# Patient Record
Sex: Female | Born: 1980 | Race: White | Hispanic: No | Marital: Married | Smoking: Current every day smoker
Health system: Southern US, Community
[De-identification: ages and names within clinical notes are randomized; demographics above are authoritative.]

## PROBLEM LIST (undated history)

## (undated) ENCOUNTER — Inpatient Hospital Stay: Payer: Self-pay

## (undated) DIAGNOSIS — O021 Missed abortion: Secondary | ICD-10-CM

## (undated) DIAGNOSIS — O24419 Gestational diabetes mellitus in pregnancy, unspecified control: Secondary | ICD-10-CM

## (undated) DIAGNOSIS — B977 Papillomavirus as the cause of diseases classified elsewhere: Secondary | ICD-10-CM

## (undated) DIAGNOSIS — I10 Essential (primary) hypertension: Secondary | ICD-10-CM

## (undated) DIAGNOSIS — R87810 Cervical high risk human papillomavirus (HPV) DNA test positive: Secondary | ICD-10-CM

## (undated) DIAGNOSIS — O2620 Pregnancy care for patient with recurrent pregnancy loss, unspecified trimester: Secondary | ICD-10-CM

## (undated) DIAGNOSIS — N946 Dysmenorrhea, unspecified: Secondary | ICD-10-CM

## (undated) DIAGNOSIS — R87610 Atypical squamous cells of undetermined significance on cytologic smear of cervix (ASC-US): Secondary | ICD-10-CM

## (undated) DIAGNOSIS — F419 Anxiety disorder, unspecified: Secondary | ICD-10-CM

## (undated) DIAGNOSIS — E669 Obesity, unspecified: Secondary | ICD-10-CM

## (undated) HISTORY — DX: Papillomavirus as the cause of diseases classified elsewhere: B97.7

## (undated) HISTORY — DX: Anxiety disorder, unspecified: F41.9

## (undated) HISTORY — DX: Atypical squamous cells of undetermined significance on cytologic smear of cervix (ASC-US): R87.610

## (undated) HISTORY — PX: TONSILLECTOMY: SUR1361

## (undated) HISTORY — PX: BREAST SURGERY: SHX581

## (undated) HISTORY — DX: Gestational diabetes mellitus in pregnancy, unspecified control: O24.419

## (undated) HISTORY — DX: Dysmenorrhea, unspecified: N94.6

## (undated) HISTORY — PX: BREAST ENHANCEMENT SURGERY: SHX7

## (undated) HISTORY — DX: Cervical high risk human papillomavirus (HPV) DNA test positive: R87.810

---

## 2011-12-22 DIAGNOSIS — R87610 Atypical squamous cells of undetermined significance on cytologic smear of cervix (ASC-US): Secondary | ICD-10-CM

## 2011-12-22 HISTORY — DX: Atypical squamous cells of undetermined significance on cytologic smear of cervix (ASC-US): R87.610

## 2011-12-23 LAB — RESULTS CONSOLE HPV: CHL HPV: POSITIVE

## 2012-01-01 HISTORY — PX: COLPOSCOPY: SHX161

## 2016-01-02 LAB — HM HIV SCREENING LAB: HM HIV Screening: NEGATIVE

## 2016-01-02 LAB — HEPATIC FUNCTION PANEL
ALT: 10 (ref 7–35)
AST: 11 — AB (ref 13–35)
Alkaline Phosphatase: 53 (ref 25–125)
Bilirubin, Total: 0.3

## 2016-01-02 LAB — COMPREHENSIVE METABOLIC PANEL
Albumin: 4.2 (ref 3.5–5.0)
Calcium: 9 (ref 8.7–10.7)
Globulin: 2.3

## 2016-01-02 LAB — CBC: RBC: 4.04 (ref 3.87–5.11)

## 2016-01-02 LAB — BASIC METABOLIC PANEL
BUN: 8 (ref 4–21)
CO2: 18 (ref 13–22)
Chloride: 98 — AB (ref 99–108)
Creatinine: 0.6 (ref 0.5–1.1)
Glucose: 130
Potassium: 3.9 (ref 3.4–5.3)
Sodium: 136 — AB (ref 137–147)

## 2016-01-02 LAB — CBC AND DIFFERENTIAL
HCT: 38 (ref 36–46)
Hemoglobin: 12.6 (ref 12.0–16.0)
WBC: 7.6

## 2016-01-02 LAB — OB RESULTS CONSOLE GC/CHLAMYDIA: Chlamydia: NEGATIVE

## 2016-02-14 ENCOUNTER — Encounter: Payer: Self-pay | Admitting: *Deleted

## 2016-02-14 ENCOUNTER — Inpatient Hospital Stay: Admission: RE | Admit: 2016-02-14 | Payer: Self-pay | Source: Ambulatory Visit

## 2016-02-14 NOTE — Patient Instructions (Signed)
  Your procedure is scheduled on: 02-15-16  Report to [facility] 2ND FLOOR SURGERY DESK @ 1:30-PT NOTIFIED OF TIME   Remember: Instructions that are not followed completely may result in serious medical risk, up to and including death, or upon the discretion of your surgeon and anesthesiologist your surgery may need to be rescheduled.    _X___ 1. Do not eat food or drink liquids after midnight. No gum chewing or hard candies.     _X___ 2. No Alcohol for 24 hours before or after surgery.   ____ 3. Bring all medications with you on the day of surgery if instructed.    ____ 4. Notify your doctor if there is any change in your medical condition     (cold, fever, infections).     Do not wear jewelry, make-up, hairpins, clips or nail polish.  Do not wear lotions, powders, or perfumes. You may wear deodorant.  Do not shave 48 hours prior to surgery. Men may shave face and neck.  Do not bring valuables to the hospital.    [facility] is not responsible for any belongings or valuables.               Contacts, dentures or bridgework may not be worn into surgery.  Leave your suitcase in the car. After surgery it may be brought to your room.  For patients admitted to the hospital, discharge time is determined by your treatment team.   Patients discharged the day of surgery will not be allowed to drive home.   Please read over the following fact sheets that you were given:     ____ Take these medicines the morning of surgery with A SIP OF WATER:    1. NONE  2.   3.   4.  5.  6.  ____ Fleet Enema (as directed)   ____ Use CHG Soap as directed  ____ Use inhalers on the day of surgery  ____ Stop metformin 2 days prior to surgery    ____ Take 1/2 of usual insulin dose the night before surgery and none on the morning of surgery.   ____ Stop Coumadin/Plavix/aspirin-N/A  X____ Stop Anti-inflammatories-NO NSAIDS OR ASA PRODUCTS-TYLENOL OK TO TAKE   ____ Stop supplements until after  surgery.    ____ Bring C-Pap to the hospital.

## 2016-02-14 NOTE — Pre-Procedure Instructions (Signed)
CALLED NANCY AT [facility] TO SEE MAKE SURE THE # I HAVE BEEN CALLING IS CORRECT SINCE PT IS NOT CALLING BACK FOR PHONE INTERVIEW-NANCY SAID THIS WAS THE CORRECT #

## 2016-02-15 ENCOUNTER — Ambulatory Visit
Admission: RE | Admit: 2016-02-15 | Discharge: 2016-02-15 | Disposition: A | Discharge status: Home / Self Care | Payer: 59 | Source: Ambulatory Visit | Attending: Obstetrics and Gynecology | Admitting: Obstetrics and Gynecology

## 2016-02-15 ENCOUNTER — Ambulatory Visit: Payer: 59 | Admitting: Certified Registered Nurse Anesthetist

## 2016-02-15 ENCOUNTER — Encounter
Admission: RE | Disposition: A | Discharge status: Home / Self Care | Payer: Self-pay | Source: Ambulatory Visit | Attending: Obstetrics and Gynecology

## 2016-02-15 ENCOUNTER — Encounter: Payer: Self-pay | Admitting: *Deleted

## 2016-02-15 DIAGNOSIS — I10 Essential (primary) hypertension: Secondary | ICD-10-CM

## 2016-02-15 DIAGNOSIS — O021 Missed abortion: Secondary | ICD-10-CM | POA: Insufficient documentation

## 2016-02-15 DIAGNOSIS — Z9889 Other specified postprocedural states: Secondary | ICD-10-CM

## 2016-02-15 HISTORY — DX: Essential (primary) hypertension: I10

## 2016-02-15 HISTORY — DX: Missed abortion: O02.1

## 2016-02-15 HISTORY — PX: DILATION AND EVACUATION: SHX1459

## 2016-02-15 LAB — CBC
HEMATOCRIT: 38.9 % (ref 35.0–47.0)
HEMOGLOBIN: 13.3 g/dL (ref 12.0–16.0)
MCH: 31.1 pg (ref 26.0–34.0)
MCHC: 34.1 g/dL (ref 32.0–36.0)
MCV: 91.3 fL (ref 80.0–100.0)
Platelets: 255 10*3/uL (ref 150–440)
RBC: 4.26 MIL/uL (ref 3.80–5.20)
RDW: 12.4 % (ref 11.5–14.5)
WBC: 8.6 10*3/uL (ref 3.6–11.0)

## 2016-02-15 LAB — TYPE AND SCREEN
ABO/RH(D): O NEG
ANTIBODY SCREEN: NEGATIVE

## 2016-02-15 SURGERY — DILATION AND EVACUATION, UTERUS
Anesthesia: General | Wound class: Clean Contaminated

## 2016-02-15 MED ORDER — FAMOTIDINE 20 MG PO TABS
ORAL_TABLET | ORAL | Status: AC
  Filled 2016-02-15: qty 1

## 2016-02-15 MED ORDER — METHYLERGONOVINE MALEATE 0.2 MG/ML IJ SOLN
INTRAMUSCULAR | Status: DC | PRN
  Administered 2016-02-15: 0.2 mg via INTRAMUSCULAR

## 2016-02-15 MED ORDER — DOXYCYCLINE HYCLATE 100 MG IV SOLR
100.0000 mg | Freq: Once | INTRAVENOUS | Status: AC
  Administered 2016-02-15: 100 mg via INTRAVENOUS
  Filled 2016-02-15: qty 100

## 2016-02-15 MED ORDER — ONDANSETRON HCL 4 MG/2ML IJ SOLN: 4.0000 mg | Freq: Once | INTRAMUSCULAR | Status: DC | PRN

## 2016-02-15 MED ORDER — DEXAMETHASONE SODIUM PHOSPHATE 10 MG/ML IJ SOLN
INTRAMUSCULAR | Status: DC | PRN
  Administered 2016-02-15: 5 mg via INTRAVENOUS

## 2016-02-15 MED ORDER — METHYLERGONOVINE MALEATE 0.2 MG/ML IJ SOLN
INTRAMUSCULAR | Status: AC
Start: 2016-02-15 — End: 2016-02-15
  Filled 2016-02-15: qty 1

## 2016-02-15 MED ORDER — LACTATED RINGERS IV SOLN
INTRAVENOUS | Status: DC
  Administered 2016-02-15: 15:00:00 via INTRAVENOUS

## 2016-02-15 MED ORDER — FAMOTIDINE 20 MG PO TABS
20.0000 mg | ORAL_TABLET | Freq: Once | ORAL | Status: AC
  Administered 2016-02-15: 20 mg via ORAL

## 2016-02-15 MED ORDER — KETOROLAC TROMETHAMINE 30 MG/ML IJ SOLN
INTRAMUSCULAR | Status: DC | PRN
  Administered 2016-02-15: 30 mg via INTRAVENOUS

## 2016-02-15 MED ORDER — FENTANYL CITRATE (PF) 100 MCG/2ML IJ SOLN
25.0000 ug | INTRAMUSCULAR | Status: DC | PRN
  Administered 2016-02-15 (×2): 50 ug via INTRAVENOUS

## 2016-02-15 MED ORDER — METHYLERGONOVINE MALEATE 0.2 MG PO TABS: 0.2000 mg | ORAL_TABLET | Freq: Three times a day (TID) | ORAL | Status: DC

## 2016-02-15 MED ORDER — FENTANYL CITRATE (PF) 100 MCG/2ML IJ SOLN
INTRAMUSCULAR | Status: DC | PRN
  Administered 2016-02-15 (×4): 25 ug via INTRAVENOUS

## 2016-02-15 MED ORDER — HYDROCODONE-ACETAMINOPHEN 5-325 MG PO TABS: 1.0000 | ORAL_TABLET | Freq: Four times a day (QID) | ORAL | Status: DC | PRN

## 2016-02-15 MED ORDER — FENTANYL CITRATE (PF) 100 MCG/2ML IJ SOLN
INTRAMUSCULAR | Status: AC
  Filled 2016-02-15: qty 2

## 2016-02-15 MED ORDER — PROPOFOL 10 MG/ML IV BOLUS
INTRAVENOUS | Status: DC | PRN
  Administered 2016-02-15: 200 mg via INTRAVENOUS

## 2016-02-15 MED ORDER — RHO D IMMUNE GLOBULIN 1500 UNIT/2ML IJ SOSY
300.0000 ug | PREFILLED_SYRINGE | Freq: Once | INTRAMUSCULAR | Status: DC
  Filled 2016-02-15: qty 2

## 2016-02-15 MED ORDER — DOXYCYCLINE HYCLATE 100 MG PO TABS
200.0000 mg | ORAL_TABLET | Freq: Once | ORAL | Status: AC
  Administered 2016-02-15: 200 mg via ORAL
  Filled 2016-02-15: qty 2

## 2016-02-15 MED ORDER — IBUPROFEN 600 MG PO TABS: 600.0000 mg | ORAL_TABLET | Freq: Four times a day (QID) | ORAL | Status: DC | PRN

## 2016-02-15 MED ORDER — MIDAZOLAM HCL 5 MG/5ML IJ SOLN
INTRAMUSCULAR | Status: DC | PRN
  Administered 2016-02-15: 2 mg via INTRAVENOUS

## 2016-02-15 MED ORDER — ONDANSETRON HCL 4 MG/2ML IJ SOLN
INTRAMUSCULAR | Status: DC | PRN
  Administered 2016-02-15: 4 mg via INTRAVENOUS

## 2016-02-15 MED ORDER — LIDOCAINE HCL (CARDIAC) 20 MG/ML IV SOLN
INTRAVENOUS | Status: DC | PRN
  Administered 2016-02-15: 60 mg via INTRAVENOUS

## 2016-02-15 SURGICAL SUPPLY — 18 items
CATH ROBINSON RED A/P 16FR (CATHETERS) ×3 IMPLANT
FILTER UTR ASPR SPEC (MISCELLANEOUS) ×1 IMPLANT
FLTR UTR ASPR SPEC (MISCELLANEOUS) ×3
GLOVE BIO SURGEON STRL SZ7 (GLOVE) ×3 IMPLANT
GOWN STRL REUS W/ TWL LRG LVL3 (GOWN DISPOSABLE) ×2 IMPLANT
GOWN STRL REUS W/TWL LRG LVL3 (GOWN DISPOSABLE) ×4
KIT BERKELEY 1ST TRIMESTER 3/8 (MISCELLANEOUS) ×3 IMPLANT
KIT RM TURNOVER CYSTO AR (KITS) ×3 IMPLANT
NS IRRIG 500ML POUR BTL (IV SOLUTION) ×3 IMPLANT
PACK DNC HYST (MISCELLANEOUS) ×3 IMPLANT
PAD OB MATERNITY 4.3X12.25 (PERSONAL CARE ITEMS) ×3 IMPLANT
PAD PREP 24X41 OB/GYN DISP (PERSONAL CARE ITEMS) ×3 IMPLANT
SET BERKELEY SUCTION TUBING (SUCTIONS) ×3 IMPLANT
TOWEL OR 17X26 4PK STRL BLUE (TOWEL DISPOSABLE) ×3 IMPLANT
VACURETTE 10 RIGID CVD (CANNULA) IMPLANT
VACURETTE 12 RIGID CVD (CANNULA) IMPLANT
VACURETTE 8 RIGID CVD (CANNULA) IMPLANT
VACURETTE 8MM F TIP (MISCELLANEOUS) ×3 IMPLANT

## 2016-02-15 NOTE — Discharge Instructions (Signed)
AMBULATORY SURGERY  DISCHARGE INSTRUCTIONS   1) The drugs that you were given will stay in your system until tomorrow so for the next 24 hours you should not:  A) Drive an automobile B) Make any legal decisions C) Drink any alcoholic beverage   2) You may resume regular meals tomorrow.  Today it is better to start with liquids and gradually work up to solid foods.  You may eat anything you prefer, but it is better to start with liquids, then soup and crackers, and gradually work up to solid foods.   3) Please notify your doctor immediately if you have any unusual bleeding, trouble breathing, redness and pain at the surgery site, drainage, fever, or pain not relieved by medication.    4) Additional Instructions:        Please contact your physician with any problems or Same Day Surgery at [phone], Monday through Friday 6 am to 4 pm, or [place] at [facility] Main number at [phone].\[id number]\

## 2016-02-15 NOTE — Anesthesia Procedure Notes (Signed)
Procedure Name: LMA Insertion Date/Time: 02/15/2016 3:17 PM Performed by: Lily KocherPERALTA, Michiko Lineman Pre-anesthesia Checklist: Patient identified, Patient being monitored, Timeout performed, Emergency Drugs available and Suction available Patient Re-evaluated:Patient Re-evaluated prior to inductionOxygen Delivery Method: Circle system utilized Preoxygenation: Pre-oxygenation with 100% oxygen Intubation Type: IV induction Ventilation: Mask ventilation without difficulty LMA: LMA inserted LMA Size: 4.0 Tube type: Oral Number of attempts: 1 Placement Confirmation: positive ETCO2 and breath sounds checked- equal and bilateral Tube secured with: Tape Dental Injury: Teeth and Oropharynx as per pre-operative assessment

## 2016-02-15 NOTE — H&P (Signed)
Date of Initial paper H&P: 02/13/2016  History reviewed, patient examined, no change in status, stable for surgery.

## 2016-02-15 NOTE — Op Note (Signed)
Patient Name: Tiffany Chapman Date of Procedure: 02/15/2016  Preoperative Diagnosis: 1) 35 y.o. with 8 week missed abortion  Postoperative Diagnosis: 1) 35 y.o. with 8 week missed abortion  Operation Performed: Suction dilation and curettage  Indication: 8 week missed abortion desiring definitive surgical management  Anesthesia: General via LMA  Primary Surgeon: Vena AustriaAndreas Alfons Sulkowski, MD  Assistant: none  Preoperative Antibiotics: none  Estimated Blood Loss: 100mL  IV Fluids: 500mL  Urine Output:: ~2720mL straight cath  Drains or Tubes: none  Implants: none  Specimens Removed: products of conception  Complications: none  Intraoperative Findings:  8 week size anteverted uterus, cervix closed  Patient Condition: stable  Procedure in Detail:  Patient was taken to the operating room were she was administered general endotracheal anesthesia.  She was positioned in the dorsal lithotomy position utilizing Allen stirups, prepped and draped in the usual sterile fashion.  Uterus was noted to be 8 weeks in size, anteverted.   Prior to proceeding with the case a time out was performed.  Attention was turned to the patient's pelvis.  A red rubber catheter was used to empty the patient's bladder.  An operative speculum was placed to allow visualization of the cervix.  The anterior lip of the cervix was grasped with a single tooth tenaculum and the cervix was sequentially dilated using pratt dilators.  A size 8 flexible suction curette was advanced to the uterine fundus and several passes undertaken to clear the uterus.  Sharp curettage was then performed noting good uterine cry throughout.  A final pass with the suction curette was then taken.    The single tooth tenaculum was removed from the cervix.  The tenaculum sites and cervix were noted to be  Hemostatic before removing the operative speculum.  0.2mg  of methergine were administered IM.  Sponge needle and instrument counts were corrects  times two.  The patient tolerated the procedure well and was taken to the recovery room in stable condition.

## 2016-02-15 NOTE — Transfer of Care (Signed)
Immediate Anesthesia Transfer of Care Note  Patient: Tiffany Chapman  Procedure(s) Performed: Procedure(s): DILATATION AND EVACUATION (N/A)  Patient Location: PACU  Anesthesia Type:General  Level of Consciousness: awake and patient cooperative  Airway & Oxygen Therapy: Patient Spontanous Breathing and Patient connected to face mask oxygen  Post-op Assessment: Report given to RN  Post vital signs: Reviewed and stable  Last Vitals:  Filed Vitals:   02/15/16 1337 02/15/16 1556  BP: 142/105 134/90  Pulse: 75   Temp: 36.8 C 36.6 C  Resp: 16 16    Last Pain: There were no vitals filed for this visit.       Complications: No apparent anesthesia complications

## 2016-02-15 NOTE — Anesthesia Postprocedure Evaluation (Signed)
Anesthesia Post Note  Patient: Tiffany Chapman  Procedure(s) Performed: Procedure(s) (LRB): DILATATION AND EVACUATION (N/A)  Patient location during evaluation: PACU Anesthesia Type: General Level of consciousness: awake and alert and oriented Pain management: pain level controlled Vital Signs Assessment: post-procedure vital signs reviewed and stable Respiratory status: spontaneous breathing Cardiovascular status: blood pressure returned to baseline Anesthetic complications: no    Last Vitals:  Filed Vitals:   02/15/16 1639 02/15/16 1648  BP:  134/80  Pulse:  69  Temp: 36.4 C 36.1 C  Resp: 16 16    Last Pain:  Filed Vitals:   02/15/16 1652  PainSc: 3                  Tiffany Chapman

## 2016-02-15 NOTE — Anesthesia Preprocedure Evaluation (Addendum)
Anesthesia Evaluation  Patient identified by MRN, date of birth, ID band Patient awake    Reviewed: Allergy & Precautions, H&P , NPO status , Patient's Chart, lab work & pertinent test results, reviewed documented beta blocker date and time   History of Anesthesia Complications Negative for: history of anesthetic complications  Airway Mallampati: II  TM Distance: >3 FB Neck ROM: full    Dental no notable dental hx. (+) Teeth Intact, Missing   Pulmonary neg shortness of breath, neg sleep apnea, neg COPD, neg recent URI, Current Smoker,    Pulmonary exam normal breath sounds clear to auscultation       Cardiovascular Exercise Tolerance: Good hypertension, (-) angina(-) CAD, (-) Past MI, (-) Cardiac Stents and (-) CABG Normal cardiovascular exam(-) dysrhythmias (-) Valvular Problems/Murmurs Rhythm:regular Rate:Normal     Neuro/Psych negative neurological ROS  negative psych ROS   GI/Hepatic negative GI ROS, Neg liver ROS,   Endo/Other  negative endocrine ROS  Renal/GU negative Renal ROS  negative genitourinary   Musculoskeletal   Abdominal   Peds  Hematology negative hematology ROS (+)   Anesthesia Other Findings Past Medical History:   Hypertension                                                   Comment:PT WAS TAKEN OFF LABETOLOL BY PCP WHEN SHE               FOUND OUT SHE WAS PREGNANT DUE TO BP BEING               UNDER CONTROL   Missed abortion                                              Reproductive/Obstetrics negative OB ROS                            Anesthesia Physical Anesthesia Plan  ASA: II  Anesthesia Plan: General   Post-op Pain Management:    Induction:   Airway Management Planned: LMA  Additional Equipment:   Intra-op Plan:   Post-operative Plan:   Informed Consent: I have reviewed the patients History and Physical, chart, labs and discussed the procedure  including the risks, benefits and alternatives for the proposed anesthesia with the patient or authorized representative who has indicated his/her understanding and acceptance.   Dental Advisory Given  Plan Discussed with: Anesthesiologist, CRNA and Surgeon  Anesthesia Plan Comments:        Anesthesia Quick Evaluation

## 2016-02-18 ENCOUNTER — Encounter: Payer: Self-pay | Admitting: Obstetrics and Gynecology

## 2016-02-19 LAB — SURGICAL PATHOLOGY

## 2016-06-10 ENCOUNTER — Ambulatory Visit: Payer: 59 | Admitting: Anesthesiology

## 2016-06-10 ENCOUNTER — Encounter: Payer: Self-pay | Admitting: *Deleted

## 2016-06-10 ENCOUNTER — Encounter
Admission: RE | Disposition: A | Discharge status: Home / Self Care | Payer: Self-pay | Source: Ambulatory Visit | Attending: Obstetrics and Gynecology

## 2016-06-10 ENCOUNTER — Ambulatory Visit
Admission: RE | Admit: 2016-06-10 | Discharge: 2016-06-10 | Disposition: A | Discharge status: Home / Self Care | Payer: 59 | Source: Ambulatory Visit | Attending: Obstetrics and Gynecology | Admitting: Obstetrics and Gynecology

## 2016-06-10 DIAGNOSIS — I10 Essential (primary) hypertension: Secondary | ICD-10-CM | POA: Insufficient documentation

## 2016-06-10 DIAGNOSIS — O021 Missed abortion: Secondary | ICD-10-CM | POA: Diagnosis not present

## 2016-06-10 DIAGNOSIS — Z79899 Other long term (current) drug therapy: Secondary | ICD-10-CM | POA: Insufficient documentation

## 2016-06-10 DIAGNOSIS — Z9889 Other specified postprocedural states: Secondary | ICD-10-CM

## 2016-06-10 DIAGNOSIS — F419 Anxiety disorder, unspecified: Secondary | ICD-10-CM | POA: Insufficient documentation

## 2016-06-10 DIAGNOSIS — F17200 Nicotine dependence, unspecified, uncomplicated: Secondary | ICD-10-CM | POA: Insufficient documentation

## 2016-06-10 HISTORY — PX: DILATION AND EVACUATION: SHX1459

## 2016-06-10 LAB — CBC
HCT: 39.9 % (ref 35.0–47.0)
Hemoglobin: 13.6 g/dL (ref 12.0–16.0)
MCH: 30.9 pg (ref 26.0–34.0)
MCHC: 34.2 g/dL (ref 32.0–36.0)
MCV: 90.4 fL (ref 80.0–100.0)
PLATELETS: 279 10*3/uL (ref 150–440)
RBC: 4.42 MIL/uL (ref 3.80–5.20)
RDW: 12.6 % (ref 11.5–14.5)
WBC: 8.1 10*3/uL (ref 3.6–11.0)

## 2016-06-10 LAB — TYPE AND SCREEN
ABO/RH(D): O NEG
Antibody Screen: NEGATIVE

## 2016-06-10 SURGERY — DILATION AND EVACUATION, UTERUS
Anesthesia: General

## 2016-06-10 MED ORDER — ONDANSETRON HCL 4 MG/2ML IJ SOLN: 4.0000 mg | Freq: Once | INTRAMUSCULAR | Status: DC | PRN

## 2016-06-10 MED ORDER — FENTANYL CITRATE (PF) 100 MCG/2ML IJ SOLN
25.0000 ug | INTRAMUSCULAR | Status: DC | PRN
  Administered 2016-06-10: 25 ug via INTRAVENOUS
  Administered 2016-06-10: 50 ug via INTRAVENOUS
  Administered 2016-06-10: 25 ug via INTRAVENOUS

## 2016-06-10 MED ORDER — LIDOCAINE HCL (CARDIAC) 20 MG/ML IV SOLN
INTRAVENOUS | Status: DC | PRN
  Administered 2016-06-10: 60 mg via INTRAVENOUS

## 2016-06-10 MED ORDER — DEXAMETHASONE SODIUM PHOSPHATE 10 MG/ML IJ SOLN
INTRAMUSCULAR | Status: DC | PRN
  Administered 2016-06-10: 5 mg via INTRAVENOUS

## 2016-06-10 MED ORDER — DOXYCYCLINE HYCLATE 100 MG PO TABS
200.0000 mg | ORAL_TABLET | Freq: Once | ORAL | Status: AC
  Administered 2016-06-10: 200 mg via ORAL
  Filled 2016-06-10: qty 2

## 2016-06-10 MED ORDER — IBUPROFEN 600 MG PO TABS: 600.0000 mg | ORAL_TABLET | Freq: Four times a day (QID) | ORAL | 0 refills | Status: DC | PRN

## 2016-06-10 MED ORDER — LACTATED RINGERS IV SOLN
INTRAVENOUS | Status: DC
  Administered 2016-06-10: 15:00:00 via INTRAVENOUS

## 2016-06-10 MED ORDER — DOXYCYCLINE HYCLATE 100 MG IV SOLR
100.0000 mg | Freq: Two times a day (BID) | INTRAVENOUS | Status: DC
  Administered 2016-06-10: 100 mg via INTRAVENOUS
  Filled 2016-06-10 (×4): qty 100

## 2016-06-10 MED ORDER — LABETALOL HCL 200 MG PO TABS
200.0000 mg | ORAL_TABLET | Freq: Once | ORAL | Status: AC
  Administered 2016-06-10: 200 mg via ORAL
  Filled 2016-06-10: qty 1

## 2016-06-10 MED ORDER — PROPOFOL 10 MG/ML IV BOLUS
INTRAVENOUS | Status: DC | PRN
  Administered 2016-06-10: 50 mg via INTRAVENOUS
  Administered 2016-06-10: 200 mg via INTRAVENOUS
  Administered 2016-06-10: 50 mg via INTRAVENOUS

## 2016-06-10 MED ORDER — MIDAZOLAM HCL 5 MG/5ML IJ SOLN
INTRAMUSCULAR | Status: DC | PRN
  Administered 2016-06-10: 2 mg via INTRAVENOUS

## 2016-06-10 MED ORDER — KETOROLAC TROMETHAMINE 30 MG/ML IJ SOLN
INTRAMUSCULAR | Status: DC | PRN
  Administered 2016-06-10: 30 mg via INTRAVENOUS

## 2016-06-10 MED ORDER — ONDANSETRON HCL 4 MG/2ML IJ SOLN
INTRAMUSCULAR | Status: DC | PRN
  Administered 2016-06-10: 4 mg via INTRAVENOUS

## 2016-06-10 MED ORDER — HYDROCODONE-ACETAMINOPHEN 5-325 MG PO TABS: 1.0000 | ORAL_TABLET | ORAL | 0 refills | Status: DC | PRN

## 2016-06-10 MED ORDER — RHO D IMMUNE GLOBULIN 1500 UNIT/2ML IJ SOSY
300.0000 ug | PREFILLED_SYRINGE | Freq: Once | INTRAMUSCULAR | Status: AC
  Administered 2016-06-10: 300 ug via INTRAMUSCULAR
  Filled 2016-06-10: qty 2

## 2016-06-10 MED ORDER — METRONIDAZOLE 500 MG PO TABS: 500.0000 mg | ORAL_TABLET | Freq: Two times a day (BID) | ORAL | 0 refills | Status: AC

## 2016-06-10 SURGICAL SUPPLY — 18 items
CATH ROBINSON RED A/P 16FR (CATHETERS) ×3 IMPLANT
FILTER UTR ASPR SPEC (MISCELLANEOUS) ×1 IMPLANT
FLTR UTR ASPR SPEC (MISCELLANEOUS) ×3
GLOVE BIO SURGEON STRL SZ7 (GLOVE) ×3 IMPLANT
GOWN STRL REUS W/ TWL LRG LVL3 (GOWN DISPOSABLE) ×2 IMPLANT
GOWN STRL REUS W/TWL LRG LVL3 (GOWN DISPOSABLE) ×4
KIT BERKELEY 1ST TRIMESTER 3/8 (MISCELLANEOUS) ×3 IMPLANT
KIT RM TURNOVER CYSTO AR (KITS) ×3 IMPLANT
NS IRRIG 500ML POUR BTL (IV SOLUTION) ×3 IMPLANT
PACK DNC HYST (MISCELLANEOUS) ×3 IMPLANT
PAD OB MATERNITY 4.3X12.25 (PERSONAL CARE ITEMS) ×3 IMPLANT
PAD PREP 24X41 OB/GYN DISP (PERSONAL CARE ITEMS) ×3 IMPLANT
SET BERKELEY SUCTION TUBING (SUCTIONS) ×3 IMPLANT
TOWEL OR 17X26 4PK STRL BLUE (TOWEL DISPOSABLE) IMPLANT
VACURETTE 10 RIGID CVD (CANNULA) IMPLANT
VACURETTE 12 RIGID CVD (CANNULA) IMPLANT
VACURETTE 8 RIGID CVD (CANNULA) IMPLANT
VACURETTE 8MM F TIP (MISCELLANEOUS) ×3 IMPLANT

## 2016-06-10 NOTE — Anesthesia Procedure Notes (Signed)
Procedure Name: LMA Insertion Date/Time: 06/10/2016 4:12 PM Performed by: Lily KocherPERALTA, Zayvien Canning Pre-anesthesia Checklist: Patient identified, Patient being monitored, Timeout performed, Emergency Drugs available and Suction available Patient Re-evaluated:Patient Re-evaluated prior to inductionOxygen Delivery Method: Circle system utilized Preoxygenation: Pre-oxygenation with 100% oxygen Intubation Type: IV induction Ventilation: Mask ventilation without difficulty LMA: LMA inserted LMA Size: 4.0 Tube type: Oral Number of attempts: 1 Placement Confirmation: positive ETCO2 and breath sounds checked- equal and bilateral Tube secured with: Tape Dental Injury: Teeth and Oropharynx as per pre-operative assessment

## 2016-06-10 NOTE — H&P (Signed)
Date of Initial paper H&P: 06/09/2016  History reviewed, patient examined, no change in status, stable for surgery.

## 2016-06-10 NOTE — Discharge Instructions (Signed)

## 2016-06-10 NOTE — Anesthesia Preprocedure Evaluation (Signed)
Anesthesia Evaluation  Patient identified by MRN, date of birth, ID band Patient awake    Reviewed: Allergy & Precautions, NPO status , Patient's Chart, lab work & pertinent test results  History of Anesthesia Complications Negative for: history of anesthetic complications  Airway Mallampati: III       Dental   Pulmonary Current Smoker,           Cardiovascular hypertension, Pt. on medications and Pt. on home beta blockers      Neuro/Psych negative neurological ROS     GI/Hepatic negative GI ROS, Neg liver ROS,   Endo/Other  negative endocrine ROS  Renal/GU negative Renal ROS     Musculoskeletal   Abdominal   Peds  Hematology negative hematology ROS (+)   Anesthesia Other Findings   Reproductive/Obstetrics                             Anesthesia Physical Anesthesia Plan  ASA: II  Anesthesia Plan: General   Post-op Pain Management:    Induction:   Airway Management Planned: LMA  Additional Equipment:   Intra-op Plan:   Post-operative Plan:   Informed Consent: I have reviewed the patients History and Physical, chart, labs and discussed the procedure including the risks, benefits and alternatives for the proposed anesthesia with the patient or authorized representative who has indicated his/her understanding and acceptance.     Plan Discussed with:   Anesthesia Plan Comments:         Anesthesia Quick Evaluation

## 2016-06-10 NOTE — Anesthesia Postprocedure Evaluation (Signed)
Anesthesia Post Note  Patient: Nolon Nationsshleigh Norton  Procedure(s) Performed: Procedure(s) (LRB): DILATATION AND EVACUATION (N/A)  Patient location during evaluation: PACU Anesthesia Type: General Level of consciousness: awake and alert Pain management: pain level controlled Vital Signs Assessment: post-procedure vital signs reviewed and stable Respiratory status: spontaneous breathing, nonlabored ventilation, respiratory function stable and patient connected to nasal cannula oxygen Cardiovascular status: blood pressure returned to baseline and stable Postop Assessment: no signs of nausea or vomiting Anesthetic complications: no    Last Vitals:  Vitals:   06/10/16 1808 06/10/16 1829  BP: 125/83 127/79  Pulse: 74 90  Resp: 16 16  Temp: 36.3 C     Last Pain:  Vitals:   06/10/16 1808  TempSrc: Tympanic  PainSc: 0-No pain                 Yevette EdwardsJames G Adams

## 2016-06-10 NOTE — Transfer of Care (Signed)
Immediate Anesthesia Transfer of Care Note  Patient: Nolon NationsAshleigh Norton  Procedure(s) Performed: Procedure(s): DILATATION AND EVACUATION (N/A)  Patient Location: PACU  Anesthesia Type:General  Level of Consciousness: sedated  Airway & Oxygen Therapy: Patient Spontanous Breathing and Patient connected to face mask oxygen  Post-op Assessment: Report given to RN and Post -op Vital signs reviewed and stable  Post vital signs: Reviewed and stable  Last Vitals:  Vitals:   06/10/16 1519 06/10/16 1650  BP: 112/67 126/74  Pulse: 89 80  Resp: 16 15  Temp: 37.1 C (!) 36 C    Last Pain:  Vitals:   06/10/16 1519  TempSrc: Oral         Complications: No apparent anesthesia complications

## 2016-06-10 NOTE — Op Note (Signed)
Patient Name: Nolon Nationsshleigh Norton Date of Procedure: 06/10/16   Preoperative Diagnosis: 1) 35 y.o. with missed abortion at approximately 5-[redacted] weeks gestation  Postoperative Diagnosis: 1) 35 y.o. with with missed abortion at approximately 5-[redacted] weeks gestation  Operation Performed: Suction dilation and curettage  Indication: Missed abortion desiring surgical managment  Anesthesia: General  Primary Surgeon: Vena AustriaAndreas Yizel Canby, MD  Assistant: none  Preoperative Antibiotics: 100mg  of doxycyline  Estimated Blood Loss: 50mL  IV Fluids: 400mL  Urine Output:: 50mL straight cath  Drains or Tubes: none  Implants: none  Specimens Removed: products of conception  Complications: none  Intraoperative Findings:  Small mobile anteverted uterus, cervix closed but easily dilated.  Small amount of tissue removed on suction D&C, good uterine cry following suction D&C.  Patient Condition: stable  Procedure in Detail:  Patient was taken to the operating room were she was administered general endotracheal anesthesia.  She was positioned in the dorsal lithotomy position utilizing Allen stirups, prepped and draped in the usual sterile fashion.  Uterus was noted to be non-enlarged in size, anteverted.   Prior to proceeding with the case a time out was performed.  Attention was turned to the patient's pelvis.  A red rubber catheter was used to empty the patient's bladder.  An operative speculum was placed to allow visualization of the cervix.  The anterior lip of the cervix was grasped with a single tooth tenaculum and the cervix was sequentially dilated using pratt dilators.  A size 8 flexible suction curette was advanced to the uterine fundus suction applied and withdrawn on 3 passes yielding a small amount of tissue.  Sharp curettage was performed with good uterine cry noted. A final pass of the suction curette was undertaken.   The single tooth tenaculum was removed from the cervix.  The tenaculum sites and  cervix were noted to be  Hemostatic before removing the operative speculum.  Sponge needle and instrument counts were corrects times two.  The patient tolerated the procedure well and was taken to the recovery room in stable condition.

## 2016-06-11 ENCOUNTER — Encounter: Payer: Self-pay | Admitting: Obstetrics and Gynecology

## 2016-06-11 LAB — RHOGAM INJECTION: Unit division: 0

## 2016-06-12 LAB — SURGICAL PATHOLOGY

## 2016-12-29 ENCOUNTER — Other Ambulatory Visit: Payer: 59

## 2016-12-29 ENCOUNTER — Telehealth: Payer: Self-pay

## 2016-12-29 ENCOUNTER — Other Ambulatory Visit: Payer: Self-pay | Admitting: Obstetrics and Gynecology

## 2016-12-29 DIAGNOSIS — Z3201 Encounter for pregnancy test, result positive: Secondary | ICD-10-CM

## 2016-12-29 DIAGNOSIS — O3680X0 Pregnancy with inconclusive fetal viability, not applicable or unspecified: Secondary | ICD-10-CM

## 2016-12-29 DIAGNOSIS — Z8759 Personal history of other complications of pregnancy, childbirth and the puerperium: Secondary | ICD-10-CM

## 2016-12-29 NOTE — Telephone Encounter (Signed)
Please advise 

## 2016-12-29 NOTE — Telephone Encounter (Signed)
Pt calling today stating she had a positive preg test this weekend. AMS had told her to call when she had this so he could get HcG level. fwding to [facility]MS nurse.

## 2016-12-29 NOTE — Telephone Encounter (Signed)
Scheduled today and Wednesday.

## 2016-12-29 NOTE — Telephone Encounter (Signed)
I placed the orders for the HCG today and Wednesday

## 2016-12-30 LAB — BETA HCG QUANT (REF LAB): HCG QUANT: 94 m[IU]/mL

## 2016-12-31 ENCOUNTER — Other Ambulatory Visit: Payer: 59

## 2016-12-31 DIAGNOSIS — Z8759 Personal history of other complications of pregnancy, childbirth and the puerperium: Secondary | ICD-10-CM

## 2016-12-31 DIAGNOSIS — Z3201 Encounter for pregnancy test, result positive: Secondary | ICD-10-CM

## 2016-12-31 DIAGNOSIS — O3680X0 Pregnancy with inconclusive fetal viability, not applicable or unspecified: Secondary | ICD-10-CM

## 2017-01-01 ENCOUNTER — Other Ambulatory Visit: Payer: Self-pay | Admitting: Obstetrics and Gynecology

## 2017-01-01 ENCOUNTER — Telehealth: Payer: Self-pay | Admitting: Obstetrics and Gynecology

## 2017-01-01 DIAGNOSIS — O3680X0 Pregnancy with inconclusive fetal viability, not applicable or unspecified: Secondary | ICD-10-CM

## 2017-01-01 DIAGNOSIS — Z8759 Personal history of other complications of pregnancy, childbirth and the puerperium: Secondary | ICD-10-CM

## 2017-01-01 LAB — BETA HCG QUANT (REF LAB): HCG QUANT: 247 m[IU]/mL

## 2017-01-01 NOTE — Telephone Encounter (Signed)
Vena Austria, MD  Kathi Ludwig; Caryn Section; Lacey Jensen        Patient needs a NOB visit and dating scan in 2 weeks   Pt is schedule schedule 01/14/17 with Jill Side

## 2017-01-01 NOTE — Telephone Encounter (Signed)
Pt is aware of appt at this time

## 2017-01-13 ENCOUNTER — Ambulatory Visit: Payer: Self-pay | Admitting: Obstetrics and Gynecology

## 2017-01-14 ENCOUNTER — Encounter: Payer: Self-pay | Admitting: Certified Nurse Midwife

## 2017-01-14 ENCOUNTER — Ambulatory Visit (INDEPENDENT_AMBULATORY_CARE_PROVIDER_SITE_OTHER): Payer: 59

## 2017-01-14 ENCOUNTER — Ambulatory Visit (INDEPENDENT_AMBULATORY_CARE_PROVIDER_SITE_OTHER): Payer: 59 | Admitting: Certified Nurse Midwife

## 2017-01-14 VITALS — BP 120/74 | Wt 256.0 lb

## 2017-01-14 DIAGNOSIS — Z8759 Personal history of other complications of pregnancy, childbirth and the puerperium: Secondary | ICD-10-CM | POA: Diagnosis not present

## 2017-01-14 DIAGNOSIS — O10919 Unspecified pre-existing hypertension complicating pregnancy, unspecified trimester: Secondary | ICD-10-CM

## 2017-01-14 DIAGNOSIS — Z6841 Body Mass Index (BMI) 40.0 and over, adult: Secondary | ICD-10-CM

## 2017-01-14 DIAGNOSIS — Z113 Encounter for screening for infections with a predominantly sexual mode of transmission: Secondary | ICD-10-CM

## 2017-01-14 DIAGNOSIS — O99210 Obesity complicating pregnancy, unspecified trimester: Secondary | ICD-10-CM

## 2017-01-14 DIAGNOSIS — Z3687 Encounter for antenatal screening for uncertain dates: Secondary | ICD-10-CM | POA: Diagnosis not present

## 2017-01-14 DIAGNOSIS — O3680X0 Pregnancy with inconclusive fetal viability, not applicable or unspecified: Secondary | ICD-10-CM

## 2017-01-14 DIAGNOSIS — R87618 Other abnormal cytological findings on specimens from cervix uteri: Secondary | ICD-10-CM

## 2017-01-14 DIAGNOSIS — O0990 Supervision of high risk pregnancy, unspecified, unspecified trimester: Secondary | ICD-10-CM | POA: Insufficient documentation

## 2017-01-14 DIAGNOSIS — F419 Anxiety disorder, unspecified: Secondary | ICD-10-CM

## 2017-01-14 DIAGNOSIS — O09519 Supervision of elderly primigravida, unspecified trimester: Secondary | ICD-10-CM

## 2017-01-14 DIAGNOSIS — Z124 Encounter for screening for malignant neoplasm of cervix: Secondary | ICD-10-CM

## 2017-01-14 DIAGNOSIS — R8789 Other abnormal findings in specimens from female genital organs: Secondary | ICD-10-CM

## 2017-01-14 DIAGNOSIS — F329 Major depressive disorder, single episode, unspecified: Secondary | ICD-10-CM

## 2017-01-14 DIAGNOSIS — F32A Depression, unspecified: Secondary | ICD-10-CM

## 2017-01-14 DIAGNOSIS — O2621 Pregnancy care for patient with recurrent pregnancy loss, first trimester: Secondary | ICD-10-CM

## 2017-01-14 LAB — HM PAP SMEAR: HM Pap smear: NORMAL

## 2017-01-14 NOTE — Progress Notes (Signed)
New Obstetric Patient H&P    Chief Complaint: "Desires prenatal care"   History of Present Illness: Tiffany Chapman is a 36 y.o. 564P0030 Caucasian female, LMP 12/03/2016 who presents with amenorrhea and positive home pregnancy test. Based on her  LMP, her Estimated Date of Delivery is 09/04/2017  and her EGA is 6wk5d. An ultrasound today gives EGA of [redacted]wk1d and an EDC=09/08/2017 which is more consistent with her longer menstrual cycles which are 30-38 days apart. . Her last pap smear was 05/16/2015  and was NIL/ neg HRHPV. Her prior Pap in 2015 was NIL/positive HRHPV and a colpo and bx were consistent with HPV effect..    Her last menstrual period was late and lasted for  4 day(s). Since her LMP she claims she has experienced mild intermittent nausea and breast tenderness. She has had some mild bilateral lower abdominal cramping, but she denies vaginal bleeding. Her past medical history is notable for obesity (BMI today is 40 kg/m2), CHTN (was on labetalol 200 mgm BID until 6 mos ago and was stopped due to normal blood pressures), and anxiety (was on Zoloft which was discontinued in Dec 2017). She had breast augmentation surgery in 2011. Her prior pregnancies are notable for three miscarriages, with the last two occuring in the last year. She had a D&C  06/10/2016 and 02/15/2016 There was no fetal pole seen with the  pregnancy in 06/2016 and the pregnancy in 02/2016 there was an 8 week fetal pole but no FCA. She and her husband have  had karyotyping which was normal. Antiphospholipid tests as well as TSH have been normal. She has been taking a baby ASA daily for her history of habitual abortions. HAs a RX for progesterone cream also that she used for one week with her previous pregnancy.   Since her LMP, she admits to the use of tobacco products  Patient is a long term smoker, but has quit 30 December 2016. No alcohol intake since her LMP. She does not use illicit street drugs. She claims she has gained    5 pounds since the start of her pregnancy.  There are cats in the home in the home  yes If yes Indoor cats. She does not empty the liter box. She admits close contact with children on a regular basis  no  She has had chicken pox in the past yes She has had Tuberculosis exposures, symptoms, or previously tested positive for TB   no Current or past history of domestic violence. no  Genetic Screening/Teratology Counseling: (Includes patient, baby's father, or anyone in either family with:)   1. [facility]  yes 2. Thalassemia ([place], [place], Mediterranean, or Asian background): MCV<80  no 3. Neural tube defect (meningomyelocele, spina bifida, anencephaly)  no 4. Congenital heart defect  no  5. Down syndrome  no 6. Tay-Sachs (Jewish, [place])  no 7. Canavan's Disease  no 8. Sickle cell disease or trait (African)  no  9. Hemophilia or other blood disorders  no  10. Muscular dystrophy  no  11. Cystic fibrosis  no  12. Huntington's Chorea  no  13. Mental retardation/autism  no 14. Other inherited genetic or chromosomal disorder  no 15. Maternal metabolic disorder (DM, PKU, etc)  no 16. Patient or FOB with a child with a birth defect not listed above no  16a. Patient or FOB with a birth defect themselves no 17. Recurrent pregnancy loss, or stillbirth  yes  18. Any medications since LMP other than prenatal vitamins (include vitamins, supplements, OTC meds, drugs, alcohol)  Baby ASA 19. Any other genetic/environmental exposure to discuss  no  Infection History:   1. Lives with someone with TB or TB exposed  no  2. Patient or partner has history of genital herpes  no 3. Rash or viral illness since LMP  no 4. History of STI (GC, CT, HPV, syphilis, HIV)  no 5. History of recent travel :  no  Other pertinent information:  Yes, Greig Castilla is FOB and is supportive    Review of Systems: Review of Systems  Constitutional: Negative for chills, fever and weight loss.   HENT: Negative for congestion, sinus pain and sore throat.   Eyes: Negative for blurred vision and pain.  Respiratory: Negative for hemoptysis, shortness of breath and wheezing.   Cardiovascular: Negative for chest pain, palpitations and leg swelling.  Gastrointestinal: Positive for abdominal pain (cramping lower abdomen) and nausea. Negative for blood in stool, diarrhea, heartburn and vomiting.  Genitourinary: Negative for dysuria, frequency, hematuria and urgency.  Musculoskeletal: Negative for back pain, joint pain and myalgias.  Skin: Negative for itching and rash.  Neurological: Negative for dizziness, tingling and headaches.  Endo/Heme/Allergies: Negative for environmental allergies and polydipsia. Does not bruise/bleed easily.       Negative for hirsutism   Psychiatric/Behavioral: Negative for depression. The patient is not nervous/anxious and does not have insomnia.   Breast: positive for breast tenderness   Past Medical History:  Past Medical History:  Diagnosis Date  . Anxiety   . 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 Dysmenorrhea   . Hypertension    labetalol discontinued in December 2017  . Missed abortion 02/2016, 06/2016    Past Surgical History:  Past Surgical History:  Procedure Laterality Date  . BREAST ENHANCEMENT SURGERY Bilateral   . COLPOSCOPY  01/01/2012   Benign  . DILATION AND EVACUATION N/A 02/15/2016   Procedure: DILATATION AND EVACUATION;  Surgeon: Vena Austria, MD;  Location: [facility] ORS;  Service: Gynecology;  Laterality: N/A;  . DILATION AND EVACUATION N/A 06/10/2016   Procedure: DILATATION AND EVACUATION;  Surgeon: Vena Austria, MD;  Location: [facility] ORS;  Service: Gynecology;  Laterality: N/A;  . TONSILLECTOMY  age 44    Gynecologic History: Patient's last menstrual period was 11/28/2016 (exact date).  Obstetric History: G80P0030  Family History:  Family History  Problem Relation Age of Onset  . Diabetes Mother   .  Multiple sclerosis Mother   . Hypertension Mother   . Lung cancer Maternal Grandmother 50  . Lung cancer Father        died at age 31  . Hypertension Father   . Diabetes Father     Social History:  Social History   Social History  . Marital status: Divorced    Spouse name: N/A  . Number of children: 0  . Years of education: N/A   Occupational History  . Not on file.   Social History Main Topics  . Smoking status: Former Smoker    Packs/day: 0.50    Years: 15.00    Types: Cigarettes    Quit date: 12/30/2016  . Smokeless tobacco: Former Neurosurgeon  . Alcohol use No  . Drug use: No  . Sexual activity: Yes    Birth control/ protection: None   Other Topics Concern  . Not on file   Social History Narrative   Greig Castilla is FOB, and involved with care  Allergies:  No Known Allergies  Medications: Prior to Admission medications    Current Outpatient Prescriptions:  .  aspirin 81 MG chewable tablet, Chew 81 mg by mouth daily., Disp: , Rfl:  .  Prenatal Vit-Fe Fumarate-FA (MULTIVITAMIN-PRENATAL) 27-0.8 MG TABS tablet, Take 1 tablet by mouth daily at 12 noon., Disp: , Rfl:     Physical Exam Vitals: BP 120/74   Wt 256 lb (116.1 kg)   LMP 11/28/2016 (Exact Date)   BMI 40.10 kg/m   General: obese WF in NAD HEENT: normocephalic, anicteric Thyroid: no enlargement, no palpable nodules Pulmonary: No increased work of breathing, CTAB Cardiovascular: RRR without murmur Abdomen: soft, non-tender, obese, non-distended.  Umbilicus without lesions.  No hepatomegaly or masses palpable. No evidence of hernia  Genitourinary:  External: Normal external female genitalia.  Normal urethral meatus, normal Bartholin's and Skene's glands.    Vagina: Normal vaginal mucosa, no evidence of prolapse.    Cervix: closed on ultrasound  Uterus: Av on ultrasound  Adnexa: no masses on ultrasound  Rectal: deferred Extremities: no edema, erythema, or tenderness Neurologic: Grossly  intact Psychiatric: mood appropriate, affect full   Assessment: 36 y.o. G4P0030 with viable  6wk1d pregnancy Habitual aborter CHTN HIstory of anxiety/ depression Obesity in pregnancy with BMI=40 kg/m2  Plan: 1) Avoid alcoholic beverages. 2) Patient encouraged not to smoke.  3) Discontinue the use of all non-medicinal drugs and chemicals.  4) Take prenatal vitamins daily.  5) Nutrition, food safety (fish, cheese advisories, and high nitrite foods) and exercise discussed. Recommend weight gain of 15-20 # 6) Hospital staffing and practice style discussed  7) Genetic Screening, such as with 1st Trimester Screening, cell free fetal DNA, AFP testing, and Ultrasound, as well as with amniocentesis and CVS as appropriate, is discussed with patient. At the conclusion of today's visit patient is considering cell free DNA testing.  8) Patient is asked about travel to areas at risk for the Zika virus, and counseled to avoid travel and exposure to mosquitoes or sexual partners who may have themselves been exposed to the virus. Testing is not indicated. 9) DIscussed risk factors placing her at high risk: AMA, CHTN, obesity. Explained she is at risk of preeclampsia and GDM. 10) After talking with Dr Bonney Aid, advised patient to continue on b ASA daily. Can use progesterone cream if desires. Is aware there is no data supporting use of progesterone to prevent miscarriage. 11.)  NOB labs today as well as CMP and TSH. 12.) Due to history of habitual abortions, will schedule repeat ultrasounds every 7-10 days until can hear FHTs with DT. RTO in 7-10 days for viability scan and 1 hour GTT.  Farrel Conners, CNM    nob

## 2017-01-16 LAB — URINE CULTURE

## 2017-01-17 LAB — PAP IG, CT-NG, RFX HPV ALL
Chlamydia, Nuc. Acid Amp: NEGATIVE
GONOCOCCUS BY NUCLEIC ACID AMP: NEGATIVE
PAP Smear Comment: 0

## 2017-01-18 ENCOUNTER — Encounter: Payer: Self-pay | Admitting: Certified Nurse Midwife

## 2017-01-18 DIAGNOSIS — O09519 Supervision of elderly primigravida, unspecified trimester: Secondary | ICD-10-CM | POA: Insufficient documentation

## 2017-01-18 DIAGNOSIS — F329 Major depressive disorder, single episode, unspecified: Secondary | ICD-10-CM | POA: Insufficient documentation

## 2017-01-18 DIAGNOSIS — O10919 Unspecified pre-existing hypertension complicating pregnancy, unspecified trimester: Secondary | ICD-10-CM | POA: Insufficient documentation

## 2017-01-18 DIAGNOSIS — Z6841 Body Mass Index (BMI) 40.0 and over, adult: Secondary | ICD-10-CM | POA: Insufficient documentation

## 2017-01-18 DIAGNOSIS — F32A Depression, unspecified: Secondary | ICD-10-CM | POA: Insufficient documentation

## 2017-01-18 DIAGNOSIS — F419 Anxiety disorder, unspecified: Secondary | ICD-10-CM

## 2017-01-18 DIAGNOSIS — O2621 Pregnancy care for patient with recurrent pregnancy loss, first trimester: Secondary | ICD-10-CM | POA: Insufficient documentation

## 2017-01-18 DIAGNOSIS — O99210 Obesity complicating pregnancy, unspecified trimester: Secondary | ICD-10-CM | POA: Insufficient documentation

## 2017-01-19 ENCOUNTER — Telehealth: Payer: Self-pay

## 2017-01-19 NOTE — Telephone Encounter (Signed)
Notified pt that error was determined & will be corrected. Results not belonging to her will be removed from her chart & charges will be removed. Pt to have her labs drawn at next visit. Msg to CLG for review.

## 2017-01-19 NOTE — Telephone Encounter (Signed)
Pt states she was in to see CLG on 01/14/17 & received a notification on [facility] patient portal of lab results. She only gave a urine sample that day & results she received were for blood work. Pt states she did not give blood that day. Pt states there was another Morrie Sheldonshley in the office that day & there was come confusion. Just wanted to alert us to get straightened out. ZO#[phone]

## 2017-01-19 NOTE — Telephone Encounter (Signed)
Investigated schedule for 01/14/17. Discussed w/Lab. Found pt that lab was drawn on. Lab tech to have charges removed from patients account.

## 2017-01-23 ENCOUNTER — Encounter: Payer: 59 | Admitting: Certified Nurse Midwife

## 2017-01-23 ENCOUNTER — Ambulatory Visit (INDEPENDENT_AMBULATORY_CARE_PROVIDER_SITE_OTHER): Payer: 59

## 2017-01-23 ENCOUNTER — Ambulatory Visit (INDEPENDENT_AMBULATORY_CARE_PROVIDER_SITE_OTHER): Payer: 59 | Admitting: Certified Nurse Midwife

## 2017-01-23 ENCOUNTER — Other Ambulatory Visit: Payer: Self-pay | Admitting: Certified Nurse Midwife

## 2017-01-23 VITALS — BP 120/80 | Wt 253.0 lb

## 2017-01-23 DIAGNOSIS — O99210 Obesity complicating pregnancy, unspecified trimester: Secondary | ICD-10-CM

## 2017-01-23 DIAGNOSIS — O0991 Supervision of high risk pregnancy, unspecified, first trimester: Secondary | ICD-10-CM

## 2017-01-23 DIAGNOSIS — O2621 Pregnancy care for patient with recurrent pregnancy loss, first trimester: Secondary | ICD-10-CM

## 2017-01-23 DIAGNOSIS — O10919 Unspecified pre-existing hypertension complicating pregnancy, unspecified trimester: Secondary | ICD-10-CM

## 2017-01-23 LAB — OB RESULTS CONSOLE VARICELLA ZOSTER ANTIBODY, IGG: Varicella: NON-IMMUNE/NOT IMMUNE

## 2017-01-23 LAB — HEPATIC FUNCTION PANEL
ALT: 7 (ref 7–35)
AST: 11 — AB (ref 13–35)

## 2017-01-23 LAB — CBC AND DIFFERENTIAL: Platelets: 283 (ref 150–399)

## 2017-01-23 LAB — BASIC METABOLIC PANEL: Creatinine: 0.7 (ref 0.5–1.1)

## 2017-01-23 LAB — OB RESULTS CONSOLE HIV ANTIBODY (ROUTINE TESTING): HIV: NONREACTIVE

## 2017-01-23 NOTE — Progress Notes (Signed)
F/u viability scan today. Pt reports no problems. NOB labs to be drawn today.

## 2017-01-24 LAB — COMPREHENSIVE METABOLIC PANEL
ALBUMIN: 4.2 g/dL (ref 3.5–5.5)
ALT: 7 IU/L (ref 0–32)
AST: 11 IU/L (ref 0–40)
Albumin/Globulin Ratio: 1.5 (ref 1.2–2.2)
Alkaline Phosphatase: 60 IU/L (ref 39–117)
BUN/Creatinine Ratio: 15 (ref 9–23)
BUN: 10 mg/dL (ref 6–20)
Bilirubin Total: <0.2 mg/dL (ref 0.0–1.2)
CALCIUM: 9.6 mg/dL (ref 8.7–10.2)
CO2: 19 mmol/L (ref 18–29)
CREATININE: 0.65 mg/dL (ref 0.57–1.00)
Chloride: 101 mmol/L (ref 96–106)
GFR calc non Af Amer: 115 mL/min/{1.73_m2} (ref >59)
GFR, EST AFRICAN AMERICAN: 132 mL/min/{1.73_m2} (ref >59)
GLUCOSE: 87 mg/dL (ref 65–99)
Globulin, Total: 2.8 g/dL (ref 1.5–4.5)
Potassium: 4.4 mmol/L (ref 3.5–5.2)
Sodium: 137 mmol/L (ref 134–144)
TOTAL PROTEIN: 7 g/dL (ref 6.0–8.5)

## 2017-01-24 LAB — RPR+RH+ABO+RUB AB+AB SCR+CB...
Antibody Screen: NEGATIVE
HEMOGLOBIN: 12.7 g/dL (ref 11.1–15.9)
HEP B S AG: NEGATIVE
HIV Screen 4th Generation wRfx: NONREACTIVE
Hematocrit: 37.7 % (ref 34.0–46.6)
MCH: 31.6 pg (ref 26.6–33.0)
MCHC: 33.7 g/dL (ref 31.5–35.7)
MCV: 94 fL (ref 79–97)
PLATELETS: 283 10*3/uL (ref 150–379)
RBC: 4.02 x10E6/uL (ref 3.77–5.28)
RDW: 12.3 % (ref 12.3–15.4)
RPR Ser Ql: NONREACTIVE
Rh Factor: NEGATIVE
Rubella Antibodies, IGG: 11.3 index (ref >0.99)
VARICELLA: 2025 {index} (ref >165)
WBC: 11.8 10*3/uL — ABNORMAL HIGH (ref 3.4–10.8)

## 2017-01-24 LAB — TSH: TSH: 1.22 u[IU]/mL (ref 0.450–4.500)

## 2017-01-27 ENCOUNTER — Encounter: Payer: Self-pay | Admitting: Certified Nurse Midwife

## 2017-01-27 ENCOUNTER — Other Ambulatory Visit: Payer: Self-pay | Admitting: Obstetrics and Gynecology

## 2017-01-27 DIAGNOSIS — O26899 Other specified pregnancy related conditions, unspecified trimester: Secondary | ICD-10-CM

## 2017-01-27 DIAGNOSIS — Z6791 Unspecified blood type, Rh negative: Secondary | ICD-10-CM | POA: Insufficient documentation

## 2017-01-27 LAB — PROTEIN / CREATININE RATIO, URINE
Creatinine, Urine: 42.8 mg/dL
PROTEIN/CREAT RATIO: 98 mg/g{creat} (ref 0–200)
Protein, Ur: 4.2 mg/dL

## 2017-01-27 NOTE — Progress Notes (Signed)
CRL 7wk5 days with FCA 156 BPM. NOB labs+TSH+CMP today (did not have at last visit, results in computer from another patient) No vaginal bleeding Viability scan in 7-10 days Will get 1 hr GTT with Informaseq at 10-12 weeks

## 2017-01-28 NOTE — Telephone Encounter (Signed)
Please advise 

## 2017-01-29 ENCOUNTER — Other Ambulatory Visit: Payer: Self-pay | Admitting: Certified Nurse Midwife

## 2017-01-29 DIAGNOSIS — O10919 Unspecified pre-existing hypertension complicating pregnancy, unspecified trimester: Secondary | ICD-10-CM

## 2017-02-03 ENCOUNTER — Ambulatory Visit (INDEPENDENT_AMBULATORY_CARE_PROVIDER_SITE_OTHER): Payer: 59

## 2017-02-03 ENCOUNTER — Ambulatory Visit (INDEPENDENT_AMBULATORY_CARE_PROVIDER_SITE_OTHER): Payer: 59 | Admitting: Advanced Practice Midwife

## 2017-02-03 VITALS — BP 120/80 | Wt 255.0 lb

## 2017-02-03 DIAGNOSIS — O2621 Pregnancy care for patient with recurrent pregnancy loss, first trimester: Secondary | ICD-10-CM | POA: Diagnosis not present

## 2017-02-03 DIAGNOSIS — Z3A09 9 weeks gestation of pregnancy: Secondary | ICD-10-CM

## 2017-02-03 DIAGNOSIS — Z362 Encounter for other antenatal screening follow-up: Secondary | ICD-10-CM

## 2017-02-03 DIAGNOSIS — Z1379 Encounter for other screening for genetic and chromosomal anomalies: Secondary | ICD-10-CM

## 2017-02-03 DIAGNOSIS — O99210 Obesity complicating pregnancy, unspecified trimester: Secondary | ICD-10-CM

## 2017-02-03 DIAGNOSIS — O0990 Supervision of high risk pregnancy, unspecified, unspecified trimester: Secondary | ICD-10-CM

## 2017-02-03 DIAGNOSIS — O0991 Supervision of high risk pregnancy, unspecified, first trimester: Secondary | ICD-10-CM

## 2017-02-03 DIAGNOSIS — Z131 Encounter for screening for diabetes mellitus: Secondary | ICD-10-CM

## 2017-02-03 DIAGNOSIS — O2620 Pregnancy care for patient with recurrent pregnancy loss, unspecified trimester: Secondary | ICD-10-CM

## 2017-02-03 NOTE — Progress Notes (Signed)
Viability scan today- size = dates. Will have another viability scan at [facility] in [redacted]wks along with 1 hr gtt and informaseq. No cramping/bleeding/LOF.

## 2017-02-10 ENCOUNTER — Telehealth: Payer: Self-pay

## 2017-02-10 NOTE — Progress Notes (Signed)
Order(s) created erroneously. Erroneous order ID: [id number]  Order moved by: Kristy Catoe M  Order move date/time: 02/10/2017 12:24 PM  Source Patient: [id number]  Source Contact: 01/14/2017  Destination Patient: [id number]  Destination Contact: 11/23/2012 

## 2017-02-10 NOTE — Telephone Encounter (Signed)
[facility] call [phone]

## 2017-02-10 NOTE — Progress Notes (Signed)
Order(s) created erroneously. Erroneous order ID: [id number]  Order moved by: Tniya Bowditch M  Order move date/time: 02/10/2017 12:25 PM  Source Patient: [id number]  Source Contact: 01/14/2017  Destination Patient: [id number]  Destination Contact: 11/23/2012 

## 2017-02-10 NOTE — Progress Notes (Signed)
Order(s) created erroneously. Erroneous order ID: [id number]  Order moved by: Deatra CanterSMITH, Mathew Postiglione M  Order move date/time: 02/10/2017 12:25 PM  Source Patient: [id number]  Source Contact: 01/14/2017  Destination Patient: [id number]  Destination Contact: 11/23/2012

## 2017-02-10 NOTE — Telephone Encounter (Signed)
Pt calling to be sure order is in for Informaseque and would like to sched it either tomorrow or Thurs at 11:30.  It is sched for 11:30 tomorrow.  [phone] [id number] LMTC.

## 2017-02-11 ENCOUNTER — Other Ambulatory Visit: Payer: 59

## 2017-02-11 NOTE — Telephone Encounter (Signed)
Pt is [facility] to [facility] please call [phone]

## 2017-02-11 NOTE — Telephone Encounter (Signed)
Pt aware lab order in c gender and appt sched for today but it's too late for her to get here on time.  Appt changed to tomorrow.

## 2017-02-12 ENCOUNTER — Other Ambulatory Visit: Payer: 59

## 2017-02-17 ENCOUNTER — Other Ambulatory Visit: Payer: 59

## 2017-02-18 ENCOUNTER — Ambulatory Visit (INDEPENDENT_AMBULATORY_CARE_PROVIDER_SITE_OTHER): Payer: 59

## 2017-02-18 ENCOUNTER — Ambulatory Visit (INDEPENDENT_AMBULATORY_CARE_PROVIDER_SITE_OTHER): Payer: 59 | Admitting: Obstetrics and Gynecology

## 2017-02-18 VITALS — BP 126/84 | Wt 256.0 lb

## 2017-02-18 DIAGNOSIS — O2620 Pregnancy care for patient with recurrent pregnancy loss, unspecified trimester: Secondary | ICD-10-CM | POA: Diagnosis not present

## 2017-02-18 DIAGNOSIS — F329 Major depressive disorder, single episode, unspecified: Secondary | ICD-10-CM

## 2017-02-18 DIAGNOSIS — Z3A11 11 weeks gestation of pregnancy: Secondary | ICD-10-CM

## 2017-02-18 DIAGNOSIS — F419 Anxiety disorder, unspecified: Secondary | ICD-10-CM

## 2017-02-18 DIAGNOSIS — O09511 Supervision of elderly primigravida, first trimester: Secondary | ICD-10-CM

## 2017-02-18 DIAGNOSIS — O0990 Supervision of high risk pregnancy, unspecified, unspecified trimester: Secondary | ICD-10-CM

## 2017-02-18 LAB — GLUCOSE, 1 HOUR GESTATIONAL: Gestational Diabetes Screen: 121 mg/dL (ref 65–139)

## 2017-02-18 NOTE — Patient Instructions (Addendum)
Start low dose ASA at >[redacted] weeks gestation as per USPTF recommendation "Low-Dose Aspirin Use for the Prevention of Morbidity and Mortality From Preeclampsia: Preventive Medicine"  furthermore endorsed by ACOG, WHO, and NIH based on evidence level B for the prevention of preeclampsia  In women deemed high risk  (diabetes, renal disease, chronic hypertension, history of preeclampsia in prior gestation, autoimmune diseases, or multifetal gestations)   Definition of recurrent miscarriage, defined as 3 or more successive first trimester losses discussed in detail.  The most commonly identifiable etiology is antiphospholipid antibody syndrome (APS).  While APS is the only thrombophilia with a clearly proven association for first trimester miscarriage, other hypercoagulable disorders while showing a weak or inconsistent relationship to first trimester miscarriage are often times still tested for,  These include Factor V Leiden, MTHFR (homozygous), prothrombin gene mutation, Protein C and S deficiency.  Uterine structural lesions, endocrine factors (thyoid disease, diabetes, and PCOS), and disorders in parental karyotype has also been implicated.  Use of daily Asprin has been evaluated in the setting of recurrent pregnancy loss and shows no clear benefit in patient without antiphospholipid antibody syndrome.  Approximately 50% of couple with recurrent miscarriage will not have a readily identifiable etiology  Several studies have examined and established a role for emotional support and stress reduction in improving pregnancy outcomes for patient with recurrent miscarriage. The tender love and care model utilizes weekly to twice weekly follow up, more frequent ultrasound, and has consistently demonstrated improved live birth rates in several studies.  The risk of miscarriage following documentation of fetal heart tones drops significantly to approximately 3%.

## 2017-02-18 NOTE — Progress Notes (Signed)
viability scan

## 2017-02-19 LAB — INFORMASEQ(SM) WITH XY ANALYSIS
FETAL FRACTION (%): 4.7
Fetal Number: 1
GESTATIONAL AGE AT COLLECTION: 10.3 wk
WEIGHT: 255 [lb_av]

## 2017-03-03 ENCOUNTER — Encounter: Payer: Self-pay | Admitting: Obstetrics and Gynecology

## 2017-03-06 ENCOUNTER — Ambulatory Visit (INDEPENDENT_AMBULATORY_CARE_PROVIDER_SITE_OTHER): Payer: 59 | Admitting: Advanced Practice Midwife

## 2017-03-06 VITALS — BP 124/78 | Wt 253.0 lb

## 2017-03-06 DIAGNOSIS — Z3A13 13 weeks gestation of pregnancy: Secondary | ICD-10-CM

## 2017-03-06 NOTE — Progress Notes (Signed)
No vb. No lof.  

## 2017-03-06 NOTE — Progress Notes (Signed)
Has had a sore throat and was seen at urgent care for strep test- rapid was negative and still waiting for results of culture. She was started on amoxicillin for presumed strep. Comfort measures reviewed and safe medication list given. Other general questions answered. RTC in 4 weeks.

## 2017-03-09 ENCOUNTER — Encounter: Payer: Self-pay | Admitting: Obstetrics and Gynecology

## 2017-03-27 ENCOUNTER — Encounter: Payer: Self-pay | Admitting: Obstetrics and Gynecology

## 2017-04-03 ENCOUNTER — Ambulatory Visit (INDEPENDENT_AMBULATORY_CARE_PROVIDER_SITE_OTHER): Payer: 59 | Admitting: Obstetrics and Gynecology

## 2017-04-03 VITALS — BP 124/82 | Wt 255.0 lb

## 2017-04-03 DIAGNOSIS — Z363 Encounter for antenatal screening for malformations: Secondary | ICD-10-CM

## 2017-04-03 DIAGNOSIS — O09511 Supervision of elderly primigravida, first trimester: Secondary | ICD-10-CM

## 2017-04-03 DIAGNOSIS — O0990 Supervision of high risk pregnancy, unspecified, unspecified trimester: Secondary | ICD-10-CM

## 2017-04-03 NOTE — Progress Notes (Signed)
Anatomy scan next visit 

## 2017-04-03 NOTE — Progress Notes (Signed)
ROB

## 2017-04-08 ENCOUNTER — Encounter: Payer: Self-pay | Admitting: Obstetrics and Gynecology

## 2017-04-08 ENCOUNTER — Other Ambulatory Visit: Payer: Self-pay | Admitting: Obstetrics and Gynecology

## 2017-04-08 MED ORDER — ESCITALOPRAM OXALATE 10 MG PO TABS: 10.0000 mg | ORAL_TABLET | Freq: Every day | ORAL | 11 refills | Status: DC

## 2017-04-24 ENCOUNTER — Ambulatory Visit: Payer: 59

## 2017-04-24 ENCOUNTER — Ambulatory Visit (INDEPENDENT_AMBULATORY_CARE_PROVIDER_SITE_OTHER): Payer: 59 | Admitting: Obstetrics and Gynecology

## 2017-04-24 VITALS — BP 126/84 | Wt 260.0 lb

## 2017-04-24 DIAGNOSIS — O09899 Supervision of other high risk pregnancies, unspecified trimester: Secondary | ICD-10-CM

## 2017-04-24 DIAGNOSIS — O26899 Other specified pregnancy related conditions, unspecified trimester: Secondary | ICD-10-CM

## 2017-04-24 DIAGNOSIS — O10919 Unspecified pre-existing hypertension complicating pregnancy, unspecified trimester: Secondary | ICD-10-CM

## 2017-04-24 DIAGNOSIS — O09512 Supervision of elderly primigravida, second trimester: Secondary | ICD-10-CM

## 2017-04-24 DIAGNOSIS — Z363 Encounter for antenatal screening for malformations: Secondary | ICD-10-CM

## 2017-04-24 DIAGNOSIS — Z6791 Unspecified blood type, Rh negative: Secondary | ICD-10-CM

## 2017-04-24 DIAGNOSIS — F329 Major depressive disorder, single episode, unspecified: Secondary | ICD-10-CM

## 2017-04-24 DIAGNOSIS — O99210 Obesity complicating pregnancy, unspecified trimester: Secondary | ICD-10-CM

## 2017-04-24 DIAGNOSIS — O0990 Supervision of high risk pregnancy, unspecified, unspecified trimester: Secondary | ICD-10-CM

## 2017-04-24 DIAGNOSIS — F32A Depression, unspecified: Secondary | ICD-10-CM

## 2017-04-24 DIAGNOSIS — Z6841 Body Mass Index (BMI) 40.0 and over, adult: Secondary | ICD-10-CM

## 2017-04-24 DIAGNOSIS — F419 Anxiety disorder, unspecified: Secondary | ICD-10-CM

## 2017-04-24 NOTE — Progress Notes (Signed)
Routine Prenatal Care Visit  Subjective  Tiffany Chapman is a 36 y.o. G4P0030 at [redacted]w[redacted]d being seen today for ongoing prenatal care.  She is currently monitored for the following issues for this high-risk pregnancy and has Supervision of high risk pregnancy, antepartum; Advanced maternal age, primigravida; Habitual abortion history, antepartum, first trimester; Obesity in pregnancy; BMI 40.0-44.9, adult (HCC); Chronic hypertension affecting pregnancy; Anxiety and depression; and Rh negative state in antepartum period on her problem list.  ----------------------------------------------------------------------------------- Patient reports no complaints.   Contractions: Not present. Vag. Bleeding: None.  Movement: Absent. Denies leaking of fluid.  Incomplete anatomy u/s today. ----------------------------------------------------------------------------------- The following portions of the patient's history were reviewed and updated as appropriate: allergies, current medications, past family history, past medical history, past social history, past surgical history and problem list. Problem list updated.   Objective  Blood pressure 126/84, weight 260 lb (117.9 kg), last menstrual period 11/28/2016. Pregravid weight 251 lb (113.9 kg) Total Weight Gain 9 lb (4.082 kg) Urinalysis: Urine Protein: Negative Urine Glucose: Negative  Fetal Status:     Movement: Absent     General:  Alert, oriented and cooperative. Patient is in no acute distress.  Skin: Skin is warm and dry. No rash noted.   Cardiovascular: Normal heart rate noted  Respiratory: Normal respiratory effort, no problems with respiration noted  Abdomen: Soft, gravid, appropriate for gestational age. Pain/Pressure: Absent     Pelvic:  Cervical exam deferred        Extremities: Normal range of motion.     Mental Status: Normal mood and affect. Normal behavior. Normal judgment and thought content.     Assessment   36 y.o. G4P0030 at [redacted]w[redacted]d  by  09/08/2017, by Ultrasound presenting for routine prenatal visit  Plan   4th pregnancy Problems (from 01/14/17 to present)    Problem Noted Resolved   Rh negative state in antepartum period 01/27/2017 by Farrel Conners, CNM No   Advanced maternal age, primigravida 01/18/2017 by Farrel Conners, CNM No   Obesity in pregnancy 01/18/2017 by Farrel Conners, CNM No   BMI 40.0-44.9, adult (HCC) 01/18/2017 by Farrel Conners, CNM No   Supervision of high risk pregnancy, antepartum 01/14/2017 by Trellis Moment, CMA No   Overview Addendum 01/18/2017  4:28 PM by Farrel Conners, CNM     Clinic  Prenatal Labs  Dating  Blood type: O/Positive/-- (05/09 1538)   Genetic Screen 1 Screen:    AFP:     Quad:     NIPS: Antibody:Negative (05/09 1538)  Anatomic [place]  Rubella: <0.90 (05/09 1538)  GTT Early:               Third trimester:  RPR: Non Reactive (05/09 1538)   Flu vaccine  HBsAg: Negative (05/09 1538)   TDaP vaccine                                               Rhogam: HIV: Non Reactive (05/09 1538)   Baby Food                                               GBS: (For PCN allergy, check sensitivities)  Contraception  Pap:  Circumcision  Varicella: Non immune  Pediatrician  Support Person    HIgh risk for obesity with BMI=40, CHTN, habitual abortions, AMA, and anxiety/depression            Preterm labor symptoms and general obstetric precautions including but not limited to vaginal bleeding, contractions, leaking of fluid and fetal movement were reviewed in detail with the patient. Please refer to After Visit Summary for other counseling recommendations.   Return in about 4 weeks (around 05/22/2017) for schedule completion anatomy u/s and ROB.  Thomasene Mohair, MD  04/24/2017 5:24 PM

## 2017-05-06 ENCOUNTER — Encounter: Payer: Self-pay | Admitting: Obstetrics and Gynecology

## 2017-05-27 ENCOUNTER — Other Ambulatory Visit: Payer: 59

## 2017-05-27 ENCOUNTER — Encounter: Payer: 59 | Admitting: Obstetrics and Gynecology

## 2017-06-04 ENCOUNTER — Ambulatory Visit (INDEPENDENT_AMBULATORY_CARE_PROVIDER_SITE_OTHER): Payer: 59

## 2017-06-04 ENCOUNTER — Ambulatory Visit (INDEPENDENT_AMBULATORY_CARE_PROVIDER_SITE_OTHER): Payer: 59 | Admitting: Obstetrics and Gynecology

## 2017-06-04 VITALS — BP 122/80 | Wt 266.0 lb

## 2017-06-04 DIAGNOSIS — Z362 Encounter for other antenatal screening follow-up: Secondary | ICD-10-CM | POA: Diagnosis not present

## 2017-06-04 DIAGNOSIS — Z3A26 26 weeks gestation of pregnancy: Secondary | ICD-10-CM

## 2017-06-04 DIAGNOSIS — O0990 Supervision of high risk pregnancy, unspecified, unspecified trimester: Secondary | ICD-10-CM

## 2017-06-04 DIAGNOSIS — Z130 Encounter for screening for diseases of the blood and blood-forming organs and certain disorders involving the immune mechanism: Secondary | ICD-10-CM

## 2017-06-04 DIAGNOSIS — Z131 Encounter for screening for diabetes mellitus: Secondary | ICD-10-CM

## 2017-06-04 DIAGNOSIS — Z113 Encounter for screening for infections with a predominantly sexual mode of transmission: Secondary | ICD-10-CM

## 2017-06-04 NOTE — Progress Notes (Signed)
Pos PNVs. No VB, LOF. Anat scan complete. GERD--relieved with zantac/tums. Bottle/POPs. 28 wk labs/Rhogam nv.

## 2017-06-09 ENCOUNTER — Telehealth: Payer: Self-pay

## 2017-06-09 NOTE — Telephone Encounter (Signed)
FMLA/DISABILITY form for [employer] filled out and given to [place] for processing. 

## 2017-06-14 ENCOUNTER — Encounter: Payer: Self-pay | Admitting: Obstetrics and Gynecology

## 2017-06-18 ENCOUNTER — Ambulatory Visit (INDEPENDENT_AMBULATORY_CARE_PROVIDER_SITE_OTHER): Payer: 59 | Admitting: Obstetrics and Gynecology

## 2017-06-18 ENCOUNTER — Other Ambulatory Visit: Payer: 59

## 2017-06-18 VITALS — BP 124/80 | Wt 271.0 lb

## 2017-06-18 DIAGNOSIS — Z6791 Unspecified blood type, Rh negative: Secondary | ICD-10-CM | POA: Diagnosis not present

## 2017-06-18 DIAGNOSIS — O09513 Supervision of elderly primigravida, third trimester: Secondary | ICD-10-CM

## 2017-06-18 DIAGNOSIS — Z23 Encounter for immunization: Secondary | ICD-10-CM

## 2017-06-18 DIAGNOSIS — Z130 Encounter for screening for diseases of the blood and blood-forming organs and certain disorders involving the immune mechanism: Secondary | ICD-10-CM

## 2017-06-18 DIAGNOSIS — Z3A26 26 weeks gestation of pregnancy: Secondary | ICD-10-CM

## 2017-06-18 DIAGNOSIS — O0990 Supervision of high risk pregnancy, unspecified, unspecified trimester: Secondary | ICD-10-CM

## 2017-06-18 DIAGNOSIS — Z3A28 28 weeks gestation of pregnancy: Secondary | ICD-10-CM

## 2017-06-18 DIAGNOSIS — O99210 Obesity complicating pregnancy, unspecified trimester: Secondary | ICD-10-CM

## 2017-06-18 DIAGNOSIS — O09512 Supervision of elderly primigravida, second trimester: Secondary | ICD-10-CM

## 2017-06-18 DIAGNOSIS — Z131 Encounter for screening for diabetes mellitus: Secondary | ICD-10-CM

## 2017-06-18 DIAGNOSIS — M545 Low back pain, unspecified: Secondary | ICD-10-CM

## 2017-06-18 DIAGNOSIS — O26899 Other specified pregnancy related conditions, unspecified trimester: Principal | ICD-10-CM

## 2017-06-18 DIAGNOSIS — O09899 Supervision of other high risk pregnancies, unspecified trimester: Secondary | ICD-10-CM

## 2017-06-18 DIAGNOSIS — Z113 Encounter for screening for infections with a predominantly sexual mode of transmission: Secondary | ICD-10-CM

## 2017-06-18 DIAGNOSIS — O10919 Unspecified pre-existing hypertension complicating pregnancy, unspecified trimester: Secondary | ICD-10-CM

## 2017-06-18 LAB — BASIC METABOLIC PANEL: Creatinine: 0.7 (ref 0.5–1.1)

## 2017-06-18 LAB — CBC AND DIFFERENTIAL: Platelets: 283 (ref 150–399)

## 2017-06-18 LAB — HEPATIC FUNCTION PANEL
ALT: 7 (ref 7–35)
AST: 11 — AB (ref 13–35)

## 2017-06-18 MED ORDER — RHO D IMMUNE GLOBULIN 1500 UNITS IM SOSY: 1500.0000 [IU] | PREFILLED_SYRINGE | Freq: Once | INTRAMUSCULAR | Status: DC

## 2017-06-18 MED ORDER — RHO D IMMUNE GLOBULIN 1500 UNIT/2ML IJ SOSY: 300.0000 ug | PREFILLED_SYRINGE | Freq: Once | INTRAMUSCULAR | 0 refills | Status: DC

## 2017-06-18 MED ORDER — RHO D IMMUNE GLOBULIN 1500 UNIT/2ML IJ SOSY
300.0000 ug | PREFILLED_SYRINGE | Freq: Once | INTRAMUSCULAR | Status: AC
  Administered 2017-06-18: 300 ug via INTRAMUSCULAR

## 2017-06-18 NOTE — Addendum Note (Signed)
Addended by: [place], Juron Vorhees B on: 06/18/2017 03:09 PM   Modules accepted: Orders

## 2017-06-18 NOTE — Progress Notes (Signed)
ROB 28 week labs Rhogam given Lower back pain

## 2017-06-18 NOTE — Addendum Note (Signed)
Addended by: [place], Elayah Klooster B on: 06/18/2017 12:19 PM   Modules accepted: Orders

## 2017-06-18 NOTE — Progress Notes (Signed)
Routine Prenatal Care Visit  Subjective  Tiffany Chapman is a 36 y.o. G4P0030 at [redacted]w[redacted]d being seen today for ongoing prenatal care.  She is currently monitored for the following issues for this high-risk pregnancy and has Supervision of high risk pregnancy, antepartum; Advanced maternal age, primigravida; Habitual abortion history, antepartum, first trimester; Obesity in pregnancy; BMI 40.0-44.9, adult (HCC); Chronic hypertension affecting pregnancy; Anxiety and depression; and Rh negative state in antepartum period on her problem list.  ----------------------------------------------------------------------------------- Patient reports lumbago.  Discussed supportive option including heat to lower back, tylenol, pregnancy support belts, wearing shoes with good arch support.  If continues discussed trial of flexeril and PT evaluation..   Contractions: Not present. Vag. Bleeding: None.  Movement: Present. Denies leaking of fluid.  ----------------------------------------------------------------------------------- The following portions of the patient's history were reviewed and updated as appropriate: allergies, current medications, past family history, past medical history, past social history, past surgical history and problem list. Problem list updated.   Objective  Blood pressure 124/80, weight 271 lb (122.9 kg), last menstrual period 11/28/2016. Pregravid weight 251 lb (113.9 kg) Total Weight Gain 20 lb (9.072 kg) Urinalysis:      Fetal Status: Fetal Heart Rate (bpm): 145   Movement: Present     General:  Alert, oriented and cooperative. Patient is in no acute distress.  Skin: Skin is warm and dry. No rash noted.   Cardiovascular: Normal heart rate noted  Respiratory: Normal respiratory effort, no problems with respiration noted  Abdomen: Soft, gravid, appropriate for gestational age. Pain/Pressure: Absent     Pelvic:  Cervical exam deferred        Extremities: Normal range of motion.      ental Status: Normal mood and affect. Normal behavior. Normal judgment and thought content.     Assessment   36 y.o. G4P0030 at [redacted]w[redacted]d by  09/08/2017, by Ultrasound presenting for routine prenatal visit  Plan   4th pregnancy Problems (from 01/14/17 to present)    Problem Noted Resolved   Supervision of high risk pregnancy, antepartum 01/14/2017 by Trellis Moment, CMA No   Priority:  High     Overview Addendum 06/18/2017 12:08 PM by Vena Austria, MD     Clinic [facility] Prenatal Labs  Dating LMP = 6 week [place] Blood type: O/Positive/-- (05/09 1538)   Genetic Screen NIPS: XY normal Antibody:Negative (05/09 1538)  Anatomic [place] Complete Rubella: <0.90 (05/09 1538)  GTT Early: 121           Third trimester:  RPR: Non Reactive (05/09 1538)   Flu vaccine 06/18/17 HBsAg: Negative (05/09 1538)   TDaP vaccine                                               Rhogam: 06/18/17 HIV: Non Reactive (05/09 1538)   Baby Food                              Bottle                 GBS: (For PCN allergy, check sensitivities)  Contraception       POPs Pap: 01/14/17 NIL HPV negative  Circumcision  Varicella: Non immune  Pediatrician    Support Person      HIgh risk for obesity with BMI=40, CHTN, habitual abortions, AMA,  and anxiety/depression        Rh negative state in antepartum period 01/27/2017 by Farrel Conners, CNM No   Overview Signed 06/18/2017 12:04 PM by Vena Austria, MD    Rhogam 06/18/17      Advanced maternal age, primigravida 01/18/2017 by Farrel Conners, CNM No   Overview Signed 06/18/2017 12:11 PM by Vena Austria, MD    Informaseq normal XY      Obesity in pregnancy 01/18/2017 by Farrel Conners, CNM No   Overview Signed 06/18/2017 12:11 PM by Vena Austria, MD           BMI 40.0-44.9, adult (HCC) 01/18/2017 by Farrel Conners, CNM No   Chronic hypertension affecting pregnancy 01/18/2017 by Farrel Conners, CNM No   Overview Addendum 06/18/2017 12:10 PM by  Vena Austria, MD    Monitor blood pressures. CMP and TSH WNL. Baseline PC ratio 98 mgm   Aspirin 81 mg daily after 12 weeks; discontinue after 36 weeks  baseline labs with CBC, CMP, urine protein/creatinine ratio  no BP meds unless BPs become elevated  ultrasound for growth at  28 ordered 06/18/17, 32, 36 weeks   Current antihypertensives:  None   Baseline and surveillance labs (pulled in from [facility], refresh links as needed)  Lab Results  Component Value Date   PLT 283 01/23/2017   CREATININE 0.65 01/23/2017   AST 11 01/23/2017   ALT 7 01/23/2017    Antenatal Testing CHTN - O10.919  Group I  BP < 140/90, no preeclampsia, AGA,  nml AFV, +/- meds    Group II BP > 140/90, on meds, no preeclampsia, AGA, nml AFV  20-28-34-38  20-24-28-32-35-38  32//2 x wk  28//BPP wkly then 32//2 x wk  40 no meds; 39 meds  PRN or 37             Preterm labor symptoms and general obstetric precautions including but not limited to vaginal bleeding, contractions, leaking of fluid and fetal movement were reviewed in detail with the patient. Please refer to After Visit Summary for other counseling recommendations.   - rhogam and influenza vaccination today - TDAP next visit - growth scan ordered for next visit - discussed option for lumbago in pregnancy  Return in about 2 weeks (around 07/02/2017) for ROB and growth scan.

## 2017-06-19 LAB — 28 WEEKS RH-PANEL
Antibody Screen: NEGATIVE
BASOS: 0 %
Basophils Absolute: 0 10*3/uL (ref 0.0–0.2)
EOS (ABSOLUTE): 0.1 10*3/uL (ref 0.0–0.4)
Eos: 1 %
GESTATIONAL DIABETES SCREEN: 135 mg/dL (ref 65–139)
HEMOGLOBIN: 10 g/dL — AB (ref 11.1–15.9)
HIV Screen 4th Generation wRfx: NONREACTIVE
Hematocrit: 30.4 % — ABNORMAL LOW (ref 34.0–46.6)
IMMATURE GRANS (ABS): 0 10*3/uL (ref 0.0–0.1)
Immature Granulocytes: 0 %
LYMPHS: 15 %
Lymphocytes Absolute: 1.5 10*3/uL (ref 0.7–3.1)
MCH: 31 pg (ref 26.6–33.0)
MCHC: 32.9 g/dL (ref 31.5–35.7)
MCV: 94 fL (ref 79–97)
MONOS ABS: 0.5 10*3/uL (ref 0.1–0.9)
Monocytes: 6 %
NEUTROS ABS: 7.6 10*3/uL — AB (ref 1.4–7.0)
Neutrophils: 78 %
Platelets: 283 10*3/uL (ref 150–379)
RBC: 3.23 x10E6/uL — AB (ref 3.77–5.28)
RDW: 13.3 % (ref 12.3–15.4)
RPR: NONREACTIVE
WBC: 9.7 10*3/uL (ref 3.4–10.8)

## 2017-06-22 ENCOUNTER — Encounter: Payer: Self-pay | Admitting: Obstetrics and Gynecology

## 2017-06-22 ENCOUNTER — Other Ambulatory Visit: Payer: Self-pay | Admitting: Obstetrics and Gynecology

## 2017-06-22 MED ORDER — ESCITALOPRAM OXALATE 10 MG PO TABS: 10.0000 mg | ORAL_TABLET | Freq: Every day | ORAL | 3 refills | Status: DC

## 2017-07-02 ENCOUNTER — Ambulatory Visit (INDEPENDENT_AMBULATORY_CARE_PROVIDER_SITE_OTHER): Payer: 59 | Admitting: Maternal Newborn

## 2017-07-02 ENCOUNTER — Ambulatory Visit (INDEPENDENT_AMBULATORY_CARE_PROVIDER_SITE_OTHER): Payer: 59

## 2017-07-02 VITALS — BP 132/82 | Wt 275.0 lb

## 2017-07-02 DIAGNOSIS — Z23 Encounter for immunization: Secondary | ICD-10-CM | POA: Diagnosis not present

## 2017-07-02 DIAGNOSIS — O09513 Supervision of elderly primigravida, third trimester: Secondary | ICD-10-CM | POA: Diagnosis not present

## 2017-07-02 DIAGNOSIS — Z3A28 28 weeks gestation of pregnancy: Secondary | ICD-10-CM

## 2017-07-02 DIAGNOSIS — O99210 Obesity complicating pregnancy, unspecified trimester: Secondary | ICD-10-CM | POA: Diagnosis not present

## 2017-07-02 DIAGNOSIS — Z3A30 30 weeks gestation of pregnancy: Secondary | ICD-10-CM | POA: Diagnosis not present

## 2017-07-02 DIAGNOSIS — O10919 Unspecified pre-existing hypertension complicating pregnancy, unspecified trimester: Secondary | ICD-10-CM

## 2017-07-02 DIAGNOSIS — O403XX0 Polyhydramnios, third trimester, not applicable or unspecified: Secondary | ICD-10-CM

## 2017-07-02 DIAGNOSIS — O0990 Supervision of high risk pregnancy, unspecified, unspecified trimester: Secondary | ICD-10-CM

## 2017-07-02 NOTE — Progress Notes (Signed)
Routine Prenatal Care Visit  Subjective  Tiffany Chapman is a 36 y.o. G4P0030 at 2129w2d being seen today for ongoing prenatal care.  She is currently monitored for the following issues for this high-risk pregnancy and has Supervision of high risk pregnancy, antepartum; Advanced maternal age, primigravida; Habitual abortion history, antepartum, first trimester; Obesity in pregnancy; BMI 40.0-44.9, adult (HCC); Chronic hypertension affecting pregnancy; Anxiety and depression; Rh negative state in antepartum period; and Polyhydramnios affecting pregnancy in third trimester on her problem list.  ----------------------------------------------------------------------------------- Patient reports no complaints.   Contractions: Not present. Vag. Bleeding: None.  Movement: Present. Denies leaking of fluid.  ----------------------------------------------------------------------------------- The following portions of the patient's history were reviewed and updated as appropriate: allergies, current medications, past family history, past medical history, past social history, past surgical history and problem list. Problem list updated.   Objective  Blood pressure 132/82, weight 275 lb (124.7 kg), last menstrual period 11/28/2016. Pregravid weight 251 lb (113.9 kg) Total Weight Gain 24 lb (10.9 kg) Urinalysis: Urine Protein: Negative Urine Glucose: Negative  Fetal Status: Fetal Heart Rate (bpm): 147   Movement: Present     General:  Alert, oriented and cooperative. Patient is in no acute distress.  Skin: Skin is warm and dry. No rash noted.   Cardiovascular: Normal heart rate noted  Respiratory: Normal respiratory effort, no problems with respiration noted  Abdomen: Soft, gravid, appropriate for gestational age. Pain/Pressure: Absent     Pelvic:  Cervical exam deferred        Extremities: Normal range of motion.  Edema: None  Mental Status: Normal mood and affect. Normal behavior. Normal judgment and  thought content.     Assessment   36 y.o. G4P0030 at 6129w2d, EDD 09/08/2017, by Ultrasound presenting for routine prenatal visit.  Plan   4th pregnancy Problems (from 01/14/17 to present)    Problem Noted Resolved   Rh negative state in antepartum period 01/27/2017 by Farrel ConnersGutierrez, Colleen, CNM No   Overview Signed 06/18/2017 12:04 PM by Vena AustriaStaebler, Andreas, MD    Rhogam 06/18/17      Advanced maternal age, primigravida 01/18/2017 by Farrel ConnersGutierrez, Colleen, CNM No   Overview Signed 06/18/2017 12:11 PM by Vena AustriaStaebler, Andreas, MD    Informaseq normal XY      Obesity in pregnancy 01/18/2017 by Farrel ConnersGutierrez, Colleen, CNM No   Overview Signed 06/18/2017 12:11 PM by Vena AustriaStaebler, Andreas, MD           BMI 40.0-44.9, adult (HCC) 01/18/2017 by Farrel ConnersGutierrez, Colleen, CNM No   Chronic hypertension affecting pregnancy 01/18/2017 by Farrel ConnersGutierrez, Colleen, CNM No   Overview Addendum 06/18/2017 12:10 PM by Vena AustriaStaebler, Andreas, MD    Monitor blood pressures. CMP and TSH WNL. Baseline PC ratio 98 mgm  [X]  Aspirin 81 mg daily after 12 weeks; discontinue after 36 weeks [X]  baseline labs with CBC, CMP, urine protein/creatinine ratio [ ]  no BP meds unless BPs become elevated [ ]  ultrasound for growth at [X]  28 ordered 06/18/17, 32, 36 weeks   Current antihypertensives:  None   [facility] as [facility]  Lab Results  Component Value Date   PLT 283 01/23/2017   CREATININE 0.65 01/23/2017   AST 11 01/23/2017   ALT 7 01/23/2017    Antenatal Testing CHTN - O10.919  Group I  BP < 140/90, no preeclampsia, AGA,  nml AFV, +/- meds    Group II BP > 140/90, on meds, no preeclampsia, AGA, nml AFV  20-28-34-38  20-24-28-32-35-38  32//2 x wk  28//BPP wkly then 32//2 x wk  40 no meds; 39 meds  PRN or 37         Supervision of high risk pregnancy, antepartum 01/14/2017 by Trellis Moment, CMA No   Overview Addendum 06/22/2017  1:55 PM by Rica Records, PA-C      Clinic [facility] Prenatal Labs  Dating LMP = 6 week [place] Blood type: O/Positive/-- (05/09 1538)   Genetic Screen NIPS: XY normal Antibody:Negative (05/09 1538)  Anatomic [place] Complete Rubella: <0.90 (05/09 1538)  GTT Early: 121           Third trimester: 135 RPR: Non Reactive (05/09 1538)   Flu vaccine 06/18/17 HBsAg: Negative (05/09 1538)   TDaP vaccine                                               Rhogam: 06/18/17 HIV: Non Reactive (05/09 1538)   Baby Food                              Bottle                 GBS: (For PCN allergy, check sensitivities)  Contraception       POPs Pap: 01/14/17 NIL HPV negative  Circumcision  Varicella: Non immune  Pediatrician    Support Person      HIgh risk for obesity with BMI=40, CHTN, habitual abortions, AMA, and anxiety/depression Slight anemia on 28 wk labs--Lm to add Fe supp        Growth US shows polyhydramnios, AFI=28.76 cm, EFW 4lb 8 oz, 82%tile; HC and AC at 97%tile.  TdaP today. Patient reports that lumbago has improved since last visit.  Term labor symptoms and general obstetric precautions including but not limited to vaginal bleeding, contractions, leaking of fluid and fetal movement were reviewed in detail with the patient. Please refer to After Visit Summary for other counseling recommendations.   Return in about 2 weeks (around 07/16/2017) for ROB and AFI/NST.  Marcelyn Bruins, CNM 07/02/2017  3:07 PM

## 2017-07-02 NOTE — Patient Instructions (Signed)

## 2017-07-15 ENCOUNTER — Ambulatory Visit (INDEPENDENT_AMBULATORY_CARE_PROVIDER_SITE_OTHER): Payer: 59 | Admitting: Obstetrics and Gynecology

## 2017-07-15 ENCOUNTER — Ambulatory Visit (INDEPENDENT_AMBULATORY_CARE_PROVIDER_SITE_OTHER): Payer: 59

## 2017-07-15 VITALS — BP 130/80 | Wt 279.0 lb

## 2017-07-15 DIAGNOSIS — O0990 Supervision of high risk pregnancy, unspecified, unspecified trimester: Secondary | ICD-10-CM

## 2017-07-15 DIAGNOSIS — Z362 Encounter for other antenatal screening follow-up: Secondary | ICD-10-CM

## 2017-07-15 DIAGNOSIS — O403XX0 Polyhydramnios, third trimester, not applicable or unspecified: Secondary | ICD-10-CM

## 2017-07-15 DIAGNOSIS — O99210 Obesity complicating pregnancy, unspecified trimester: Secondary | ICD-10-CM

## 2017-07-15 DIAGNOSIS — Z3A32 32 weeks gestation of pregnancy: Secondary | ICD-10-CM

## 2017-07-15 NOTE — Progress Notes (Signed)
Routine Prenatal Care Visit  Subjective  Tiffany Chapman is a 36 y.o. G4P0030 at 5841w2d being seen today for ongoing prenatal care.  She is currently monitored for the following issues for this high-risk pregnancy and has Supervision of high risk pregnancy, antepartum; Advanced maternal age, primigravida; Habitual abortion history, antepartum, first trimester; Obesity in pregnancy; BMI 40.0-44.9, adult (HCC); Chronic hypertension affecting pregnancy; Anxiety and depression; Rh negative state in antepartum period; and Polyhydramnios affecting pregnancy in third trimester on their problem list.  ----------------------------------------------------------------------------------- Patient reports no complaints.   Contractions: Not present. Vag. Bleeding: None.  Movement: Present. Denies leaking of fluid.  ----------------------------------------------------------------------------------- The following portions of the patient's history were reviewed and updated as appropriate: allergies, current medications, past family history, past medical history, past social history, past surgical history and problem list. Problem list updated.   Objective  Blood pressure 130/80, weight 279 lb (126.6 kg), last menstrual period 11/28/2016. Pregravid weight 251 lb (113.9 kg) Total Weight Gain 28 lb (12.7 kg) Urinalysis: Urine Protein: Negative Urine Glucose: Negative  Fetal Status: Fetal Heart Rate (bpm): 150 Fundal Height: 35 cm Movement: Present     General:  Alert, oriented and cooperative. Patient is in no acute distress.  Skin: Skin is warm and dry. No rash noted.   Cardiovascular: Normal heart rate noted  Respiratory: Normal respiratory effort, no problems with respiration noted  Abdomen: Soft, gravid, appropriate for gestational age. Pain/Pressure: Absent     Pelvic:  Cervical exam deferred        Extremities: Normal range of motion.  Edema: None  ental Status: Normal mood and affect. Normal behavior.  Normal judgment and thought content.   Baseline: 150 Variability: moderate Accelerations: present Decelerations: absent Tocometry: N/A The patient was monitored for 30 minutes, fetal heart rate tracing was deemed reactive, category I tracing  Assessment   36 y.o. G4P0030 at 6841w2d by  09/08/2017, by Ultrasound presenting for routine prenatal visit  Plan   4th pregnancy Problems (from 01/14/17 to present)    Problem Noted Resolved   Supervision of high risk pregnancy, antepartum 01/14/2017 by Trellis Momentroy, Hollie, CMA No   Priority:  High     Overview Addendum 07/16/2017  1:01 AM by Vena AustriaStaebler, Markie Heffernan, MD     Clinic [facility] Prenatal Labs  Dating LMP = 6 week US Blood type: O/Positive/-- (05/09 1538)   Genetic Screen NIPS: XY normal Antibody:Negative (05/09 1538)  Anatomic US Complete Rubella: <0.90 (05/09 1538)  GTT Early: 121           Third trimester: 135 RPR: Non Reactive (05/09 1538)   Flu vaccine 06/18/17 HBsAg: Negative (05/09 1538)   TDaP vaccine 07/02/17  Rhogam: 06/18/17 HIV: Non Reactive (05/09 1538)   Baby Food  Bottle                 GBS: (For PCN allergy, check sensitivities)  Contraception POPs Pap: 01/14/17 NIL HPV negative  Circumcision  Varicella: Non immune  Pediatrician    Support Person      HIgh risk for obesity with BMI=40, CHTN, habitual abortions, AMA, and anxiety/depression Slight anemia on 28 wk labs--Lm to add Fe supp       Polyhydramnios affecting pregnancy in third trimester 07/02/2017 by Oswaldo ConroySchmid, Jacelyn Y, CNM No   Rh negative state in antepartum period 01/27/2017 by Farrel ConnersGutierrez, Colleen, CNM No   Overview Signed 06/18/2017 12:04 PM by Vena AustriaStaebler, Deaisha Welborn, MD    Rhogam 06/18/17      Advanced maternal age,  primigravida 01/18/2017 by Farrel ConnersGutierrez, Colleen, CNM No   Overview Signed 06/18/2017 12:11 PM by Vena AustriaStaebler, Jhoselyn Ruffini, MD    Informaseq normal XY      Obesity in pregnancy 01/18/2017 by Farrel ConnersGutierrez, Colleen, CNM No   Overview Addendum 07/16/2017 12:59 AM by Vena AustriaStaebler,  Sula Fetterly, MD    BMI >=40 [X]  bASA (>12 weeks) [ ]  Growth u/s 28 [ ] , 32 [ ] , 36 weeks [ ]  [ ]  NST/AFI weekly 36+ weeks (36[] , 37[] , 38[] , 39[] , 40[] ) [ ]  IOL by 41 weeks (scheduled, prn [] )        BMI 40.0-44.9, adult (HCC) 01/18/2017 by Farrel ConnersGutierrez, Colleen, CNM No   Chronic hypertension affecting pregnancy 01/18/2017 by Farrel ConnersGutierrez, Colleen, CNM No   Overview Addendum 06/18/2017 12:10 PM by Vena AustriaStaebler, Jakeline Dave, MD    Monitor blood pressures. CMP and TSH WNL. Baseline PC ratio 98 mgm  [X]  Aspirin 81 mg daily after 12 weeks; discontinue after 36 weeks [X]  baseline labs with CBC, CMP, urine protein/creatinine ratio [ ]  no BP meds unless BPs become elevated [ ]  ultrasound for growth at [X]  28 ordered 06/18/17, 32, 36 weeks   Current antihypertensives:  None   [facility] surveillance [facility] as needed)  Lab Results  Component Value Date   PLT 283 01/23/2017   CREATININE 0.65 01/23/2017   AST 11 01/23/2017   ALT 7 01/23/2017    Antenatal Testing CHTN - O10.919  Group I  BP < 140/90, no preeclampsia, AGA,  nml AFV, +/- meds    Group II BP > 140/90, on meds, no preeclampsia, AGA, nml AFV  20-28-34-38  20-24-28-32-35-38  32//2 x wk  28//BPP wkly then 32//2 x wk  40 no meds; 39 meds  PRN or 37             Preterm labor symptoms and general obstetric precautions including but not limited to vaginal bleeding, contractions, leaking of fluid and fetal movement were reviewed in detail with the patient. Please refer to After Visit Summary for other counseling recommendations.  - NST reactive - AFI 34/57cm DP consult for polyhydramnios discussed etiologies including idiopathic, viral, tracheo-esophogeal fistula, duodenal atresia, and diabetes.  Normal anatomy scan, normally distended stomach on scan today, and normal glucola Return in about 1 week (around 07/22/2017) for NST/AFI ROB (ok to overbook).

## 2017-07-15 NOTE — Progress Notes (Signed)
ROB AFI/NST 

## 2017-07-16 ENCOUNTER — Telehealth: Payer: Self-pay

## 2017-07-16 NOTE — Telephone Encounter (Signed)
FMLA/DISABILITY form for [employer] filled out for pt's accommodations and given to [place] for porcessing.

## 2017-07-20 ENCOUNTER — Other Ambulatory Visit: Payer: Self-pay | Admitting: Obstetrics and Gynecology

## 2017-07-20 DIAGNOSIS — Z3689 Encounter for other specified antenatal screening: Secondary | ICD-10-CM

## 2017-07-21 ENCOUNTER — Other Ambulatory Visit: Payer: Self-pay | Admitting: Obstetrics and Gynecology

## 2017-07-21 DIAGNOSIS — O403XX0 Polyhydramnios, third trimester, not applicable or unspecified: Secondary | ICD-10-CM

## 2017-07-22 ENCOUNTER — Ambulatory Visit (INDEPENDENT_AMBULATORY_CARE_PROVIDER_SITE_OTHER): Payer: 59

## 2017-07-22 ENCOUNTER — Ambulatory Visit (INDEPENDENT_AMBULATORY_CARE_PROVIDER_SITE_OTHER): Payer: 59 | Admitting: Obstetrics and Gynecology

## 2017-07-22 VITALS — BP 138/92 | Wt 279.0 lb

## 2017-07-22 DIAGNOSIS — O09899 Supervision of other high risk pregnancies, unspecified trimester: Secondary | ICD-10-CM

## 2017-07-22 DIAGNOSIS — O403XX0 Polyhydramnios, third trimester, not applicable or unspecified: Secondary | ICD-10-CM

## 2017-07-22 DIAGNOSIS — O10919 Unspecified pre-existing hypertension complicating pregnancy, unspecified trimester: Secondary | ICD-10-CM

## 2017-07-22 DIAGNOSIS — Z6841 Body Mass Index (BMI) 40.0 and over, adult: Secondary | ICD-10-CM

## 2017-07-22 DIAGNOSIS — O0990 Supervision of high risk pregnancy, unspecified, unspecified trimester: Secondary | ICD-10-CM

## 2017-07-22 DIAGNOSIS — O09512 Supervision of elderly primigravida, second trimester: Secondary | ICD-10-CM

## 2017-07-22 DIAGNOSIS — O26899 Other specified pregnancy related conditions, unspecified trimester: Secondary | ICD-10-CM

## 2017-07-22 DIAGNOSIS — O99210 Obesity complicating pregnancy, unspecified trimester: Secondary | ICD-10-CM

## 2017-07-22 DIAGNOSIS — Z6791 Unspecified blood type, Rh negative: Secondary | ICD-10-CM

## 2017-07-22 NOTE — Progress Notes (Signed)
ROB AFI/NST 

## 2017-07-22 NOTE — Progress Notes (Signed)
Routine Prenatal Care Visit  Subjective  Tiffany Chapman is a 36 y.o. G4P0030 at 2522w1d being seen today for ongoing prenatal care.  She is currently monitored for the following issues for this high-risk pregnancy and has Supervision of high risk pregnancy, antepartum; Advanced maternal age, primigravida; Habitual abortion history, antepartum, first trimester; Obesity in pregnancy; BMI 40.0-44.9, adult (HCC); Chronic hypertension affecting pregnancy; Anxiety and depression; Rh negative state in antepartum period; and Polyhydramnios affecting pregnancy in third trimester on their problem list.  ----------------------------------------------------------------------------------- Patient reports no complaints.   Contractions: Not present. Vag. Bleeding: None.  Movement: Present. Denies leaking of fluid.  ----------------------------------------------------------------------------------- The following portions of the patient's history were reviewed and updated as appropriate: allergies, current medications, past family history, past medical history, past social history, past surgical history and problem list. Problem list updated.   Objective  Blood pressure (!) 138/92, weight 279 lb (126.6 kg), last menstrual period 11/28/2016. Pregravid weight 251 lb (113.9 kg) Total Weight Gain 28 lb (12.7 kg) 0 weight gain since last visit Urinalysis: Urine Protein: 1+ Urine Glucose: Negative  Fetal Status: Fetal Heart Rate (bpm): 150 Fundal Height: 36 cm Movement: Present     General:  Alert, oriented and cooperative. Patient is in no acute distress.  Skin: Skin is warm and dry. No rash noted.   Cardiovascular: Normal heart rate noted  Respiratory: Normal respiratory effort, no problems with respiration noted  Abdomen: Soft, gravid, appropriate for gestational age. Pain/Pressure: Absent     Pelvic:  Cervical exam deferred        Extremities: Normal range of motion.     ental Status: Normal mood and  affect. Normal behavior. Normal judgment and thought content.   Baseline: 150 Variability: moderate Accelerations: present Decelerations: absent Tocometry: N/A The patient was monitored for 30 minutes, fetal heart rate tracing was deemed reactive, category I tracing,     Assessment   36 y.o. G4P0030 at 7622w1d by  09/08/2017, by Ultrasound presenting for routine prenatal visit  Plan   4th pregnancy Problems (from 01/14/17 to present)    Problem Noted Resolved   Supervision of high risk pregnancy, antepartum 01/14/2017 by Trellis Momentroy, Hollie, CMA No   Priority:  High     Overview Addendum 07/16/2017  1:01 AM by Vena AustriaStaebler, Vantasia Pinkney, MD     [facility]  Dating LMP = 6 week US Blood type: O/Positive/-- (05/09 1538)   Genetic Screen NIPS: XY normal Antibody:Negative (05/09 1538)  Anatomic US Complete Rubella: <0.90 (05/09 1538)  GTT Early: 121           Third trimester: 135 RPR: Non Reactive (05/09 1538)   Flu vaccine 06/18/17 HBsAg: Negative (05/09 1538)   TDaP vaccine 07/02/17  Rhogam: 06/18/17 HIV: Non Reactive (05/09 1538)   Baby Food  Bottle                 GBS: (For PCN allergy, check sensitivities)  Contraception POPs Pap: 01/14/17 NIL HPV negative  Circumcision  Varicella: Non immune  Pediatrician    Support Person      HIgh risk for obesity with BMI=40, CHTN, habitual abortions, AMA, and anxiety/depression Slight anemia on 28 wk labs--Lm to add Fe supp       Polyhydramnios affecting pregnancy in third trimester 07/02/2017 by Oswaldo ConroySchmid, Jacelyn Y, CNM No   Rh negative state in antepartum period 01/27/2017 by Farrel ConnersGutierrez, Colleen, CNM No   Overview Signed 06/18/2017 12:04 PM by Vena AustriaStaebler, Rosi Secrist, MD  Rhogam 06/18/17      Advanced maternal age, primigravida 01/18/2017 by Farrel ConnersGutierrez, Colleen, CNM No   Overview Signed 06/18/2017 12:11 PM by Vena AustriaStaebler, Aswad Wandrey, MD    Informaseq normal XY      Obesity in pregnancy 01/18/2017 by Farrel ConnersGutierrez, Colleen, CNM No   Overview Addendum  07/16/2017 12:59 AM by Vena AustriaStaebler, Derrell Milanes, MD    BMI >=40 [X]  bASA (>12 weeks) [ ]  Growth u/s 28 [ ] , 32 [ ] , 36 weeks [ ]  [ ]  NST/AFI weekly 36+ weeks (36[] , 37[] , 38[] , 39[] , 40[] ) [ ]  IOL by 41 weeks (scheduled, prn [] )        BMI 40.0-44.9, adult (HCC) 01/18/2017 by Farrel ConnersGutierrez, Colleen, CNM No   Chronic hypertension affecting pregnancy 01/18/2017 by Farrel ConnersGutierrez, Colleen, CNM No   Overview Addendum 06/18/2017 12:10 PM by Vena AustriaStaebler, Beatrice Sehgal, MD    Monitor blood pressures. CMP and TSH WNL. Baseline PC ratio 98 mgm  [X]  Aspirin 81 mg daily after 12 weeks; discontinue after 36 weeks [X]  baseline labs with CBC, CMP, urine protein/creatinine ratio [ ]  no BP meds unless BPs become elevated [ ]  ultrasound for growth at [X]  28 ordered 06/18/17, 32, 36 weeks   Current antihypertensives:  None   Baseline and surveillance labs ([facility], refresh links as needed)  Lab Results  Component Value Date   PLT 283 01/23/2017   CREATININE 0.65 01/23/2017   AST 11 01/23/2017   ALT 7 01/23/2017    Antenatal Testing CHTN - O10.919  Group I  BP < 140/90, no preeclampsia, AGA,  nml AFV, +/- meds    Group II BP > 140/90, on meds, no preeclampsia, AGA, nml AFV  20-28-34-38  20-24-28-32-35-38  32//2 x wk  28//BPP wkly then 32//2 x wk  40 no meds; 39 meds  PRN or 37             Preterm labor symptoms and general obstetric precautions including but not limited to vaginal bleeding, contractions, leaking of fluid and fetal movement were reviewed in detail with the patient. Please refer to After Visit Summary for other counseling recommendations.  - AFI today 32.95cm - BP slightly elevated but remains within goal range, asymptomatic - +1 protein but no increase in edema, no weight gain over the past week, monitor - Continue twice weekly NST/AFI - Last growth scan 30 weeks, [facility]  Return in about 1 week (around 07/29/2017) for NST/AFI.

## 2017-07-23 ENCOUNTER — Ambulatory Visit
Admission: RE | Admit: 2017-07-23 | Discharge: 2017-07-23 | Disposition: A | Discharge status: Home / Self Care | Payer: 59 | Source: Ambulatory Visit | Attending: Maternal & Fetal Medicine | Admitting: Maternal & Fetal Medicine

## 2017-07-23 ENCOUNTER — Ambulatory Visit: Payer: 59

## 2017-07-23 ENCOUNTER — Encounter: Payer: Self-pay | Admitting: Obstetrics and Gynecology

## 2017-07-23 DIAGNOSIS — O403XX0 Polyhydramnios, third trimester, not applicable or unspecified: Secondary | ICD-10-CM | POA: Insufficient documentation

## 2017-07-23 DIAGNOSIS — Z3A33 33 weeks gestation of pregnancy: Secondary | ICD-10-CM | POA: Insufficient documentation

## 2017-07-23 DIAGNOSIS — O0990 Supervision of high risk pregnancy, unspecified, unspecified trimester: Secondary | ICD-10-CM

## 2017-07-23 DIAGNOSIS — O09523 Supervision of elderly multigravida, third trimester: Secondary | ICD-10-CM | POA: Diagnosis not present

## 2017-07-23 DIAGNOSIS — O10919 Unspecified pre-existing hypertension complicating pregnancy, unspecified trimester: Secondary | ICD-10-CM

## 2017-07-23 DIAGNOSIS — O2623 Pregnancy care for patient with recurrent pregnancy loss, third trimester: Secondary | ICD-10-CM | POA: Diagnosis not present

## 2017-07-23 DIAGNOSIS — O26899 Other specified pregnancy related conditions, unspecified trimester: Secondary | ICD-10-CM

## 2017-07-23 DIAGNOSIS — O99210 Obesity complicating pregnancy, unspecified trimester: Secondary | ICD-10-CM

## 2017-07-23 DIAGNOSIS — O163 Unspecified maternal hypertension, third trimester: Secondary | ICD-10-CM | POA: Diagnosis not present

## 2017-07-23 DIAGNOSIS — O09512 Supervision of elderly primigravida, second trimester: Secondary | ICD-10-CM

## 2017-07-23 DIAGNOSIS — Z6791 Unspecified blood type, Rh negative: Secondary | ICD-10-CM

## 2017-07-23 DIAGNOSIS — Z3689 Encounter for other specified antenatal screening: Secondary | ICD-10-CM

## 2017-07-23 DIAGNOSIS — Z6841 Body Mass Index (BMI) 40.0 and over, adult: Secondary | ICD-10-CM

## 2017-07-27 ENCOUNTER — Ambulatory Visit (INDEPENDENT_AMBULATORY_CARE_PROVIDER_SITE_OTHER): Payer: 59 | Admitting: Obstetrics and Gynecology

## 2017-07-27 ENCOUNTER — Ambulatory Visit (INDEPENDENT_AMBULATORY_CARE_PROVIDER_SITE_OTHER): Payer: 59

## 2017-07-27 ENCOUNTER — Encounter: Payer: Self-pay | Admitting: Obstetrics and Gynecology

## 2017-07-27 VITALS — BP 136/88 | Wt 283.0 lb

## 2017-07-27 DIAGNOSIS — Z6791 Unspecified blood type, Rh negative: Secondary | ICD-10-CM

## 2017-07-27 DIAGNOSIS — O0990 Supervision of high risk pregnancy, unspecified, unspecified trimester: Secondary | ICD-10-CM

## 2017-07-27 DIAGNOSIS — O10919 Unspecified pre-existing hypertension complicating pregnancy, unspecified trimester: Secondary | ICD-10-CM | POA: Diagnosis not present

## 2017-07-27 DIAGNOSIS — O403XX0 Polyhydramnios, third trimester, not applicable or unspecified: Secondary | ICD-10-CM | POA: Diagnosis not present

## 2017-07-27 DIAGNOSIS — O09899 Supervision of other high risk pregnancies, unspecified trimester: Secondary | ICD-10-CM

## 2017-07-27 DIAGNOSIS — O99210 Obesity complicating pregnancy, unspecified trimester: Secondary | ICD-10-CM

## 2017-07-27 DIAGNOSIS — Z3A33 33 weeks gestation of pregnancy: Secondary | ICD-10-CM

## 2017-07-27 DIAGNOSIS — O09512 Supervision of elderly primigravida, second trimester: Secondary | ICD-10-CM

## 2017-07-27 DIAGNOSIS — Z6841 Body Mass Index (BMI) 40.0 and over, adult: Secondary | ICD-10-CM

## 2017-07-27 DIAGNOSIS — O3663X0 Maternal care for excessive fetal growth, third trimester, not applicable or unspecified: Secondary | ICD-10-CM

## 2017-07-27 DIAGNOSIS — O26899 Other specified pregnancy related conditions, unspecified trimester: Secondary | ICD-10-CM

## 2017-07-27 NOTE — Progress Notes (Signed)
ROB AFI/NST 

## 2017-07-27 NOTE — Progress Notes (Signed)
Routine Prenatal Care Visit  Subjective  Tiffany Chapman is a 36 y.o. G4P0030 at 4659w6d being seen today for ongoing prenatal care.  She is currently monitored for the following issues for this high-risk pregnancy and has Supervision of high risk pregnancy, antepartum; Advanced maternal age, primigravida; Habitual abortion history, antepartum, first trimester; Obesity in pregnancy; BMI 40.0-44.9, adult (HCC); Chronic hypertension affecting pregnancy; Anxiety and depression; Rh negative state in antepartum period; and Polyhydramnios affecting pregnancy in third trimester on their problem list.  ----------------------------------------------------------------------------------- Patient reports no complaints.    .  .   . Denies leaking of fluid.     Objective   Vitals:   07/27/17 1427  BP: 136/88  Weight: 283 lb (128.4 kg)   Pregravid Weight: 251 lb (113.9 kg)  Total Weight Gain: 32 lb (14.5 kg)  Urinalysis: Urine Protein: Negative Urine Glucose: Negative  Fetal Status:           General: Alert: Oriented and cooperative. Patient is in no acute distress. Skin: Skin is warm and dry. No rash noted.  Cardiovascular: Regular rate and rhythm. No murmurs, gallops, or rubs. Respiratory: Normal respiratory effort, no problems with respiration noted Abdomen: Soft, gravid, appropriate for gestational age.   Pelvic: Cervical exam deferred       Extremeties: Normal range of motion.    Mental Status: Normal mood and affect. Normal behavior. Normal judgment and thought content.  Assessment   36 y.o. G4P0030 at 7659w6d by  09/08/2017, by Ultrasound presenting for routine prenatal visit  Plan   4th pregnancy Problems (from 01/14/17 to present)    Problem Noted Resolved   Polyhydramnios affecting pregnancy in third trimester 07/02/2017 by Oswaldo ConroySchmid, Jacelyn Y, CNM No   Rh negative state in antepartum period 01/27/2017 by Farrel ConnersGutierrez, Colleen, CNM No   Overview Signed 06/18/2017 12:04 PM by Vena AustriaStaebler,  Andreas, MD    Rhogam 06/18/17      Advanced maternal age, primigravida 01/18/2017 by Farrel ConnersGutierrez, Colleen, CNM No   Overview Signed 06/18/2017 12:11 PM by Vena AustriaStaebler, Andreas, MD    Informaseq normal XY      Obesity in pregnancy 01/18/2017 by Farrel ConnersGutierrez, Colleen, CNM No   Overview Addendum 07/26/2017 10:22 PM by Natale MilchSchuman, Christanna R, MD    BMI >=40 [X]  bASA (>12 weeks) [ ]  Growth u/s 28 [ ] , 32 [ ] , 36 weeks [ ]  [ ]  NST/AFI weekly 36+ weeks (36[] , 37[] , 38[] , 39[] , 40[] ) [ ]  IOL by 41 weeks (scheduled, prn [] )        BMI 40.0-44.9, adult (HCC) 01/18/2017 by Farrel ConnersGutierrez, Colleen, CNM No   Chronic hypertension affecting pregnancy 01/18/2017 by Farrel ConnersGutierrez, Colleen, CNM No   Overview Addendum 06/18/2017 12:10 PM by Vena AustriaStaebler, Andreas, MD    Monitor blood pressures. CMP and TSH WNL. Baseline PC ratio 98 mgm  [X]  Aspirin 81 mg daily after 12 weeks; discontinue after 36 weeks [X]  baseline labs with CBC, CMP, urine protein/creatinine ratio [ ]  no BP meds unless BPs become elevated [ ]  ultrasound for growth at [X]  28 ordered 06/18/17, 32, 36 weeks   Current antihypertensives:  None   Baseline and surveillance labs [facility] refresh links as needed)  Lab Results  Component Value Date   PLT 283 01/23/2017   CREATININE 0.65 01/23/2017   AST 11 01/23/2017   ALT 7 01/23/2017    Antenatal Testing CHTN - O10.919  Group I  BP < 140/90, no preeclampsia, AGA,  nml AFV, +/- meds  Group II BP > 140/90, on meds, no preeclampsia, AGA, nml AFV  20-28-34-38  20-24-28-32-35-38  32//2 x wk  28//BPP wkly then 32//2 x wk  40 no meds; 39 meds  PRN or 37         Supervision of high risk pregnancy, antepartum 01/14/2017 by Trellis Momentroy, Hollie, CMA No   Overview Addendum 07/27/2017  3:44 PM by Natale MilchSchuman, Christanna R, MD     [facility]  Dating LMP = 6 week US Blood type: O/Positive/-- (05/09 1538)   Genetic Screen NIPS: XY normal Antibody:Negative (05/09 1538)  Anatomic US  Complete Rubella: <0.90 (05/09 1538)  GTT Early: 121           Third trimester: 135 RPR: Non Reactive (05/09 1538)   Flu vaccine 06/18/17 HBsAg: Negative (05/09 1538)   TDaP vaccine 07/02/17  Rhogam: 06/18/17 HIV: Non Reactive (05/09 1538)   Baby Food  Bottle                 GBS: (For PCN allergy, check sensitivities)  Contraception POPs Pap: 01/14/17 NIL HPV negative  Circumcision  Varicella: Non immune  Pediatrician    Support Person      HIgh risk for obesity with BMI=40, CHTN, habitual abortions, AMA, and anxiety/depression Slight anemia on 28 wk labs--Lm to add Fe supp           Preterm labor symptoms and general obstetric precautions including but not limited to vaginal bleeding, contractions, leaking of fluid and fetal movement were reviewed in detail with the patient.  Please refer to After Visit Summary for other counseling recommendations.   Return in about 1 week (around 08/03/2017) for ROB.  Adelene Idlerhristanna Schuman M.D. @DATE @

## 2017-08-03 ENCOUNTER — Observation Stay
Admission: EM | Admit: 2017-08-03 | Discharge: 2017-08-03 | Disposition: A | Discharge status: Home / Self Care | Payer: 59 | Attending: Certified Nurse Midwife | Admitting: Certified Nurse Midwife

## 2017-08-03 ENCOUNTER — Ambulatory Visit (INDEPENDENT_AMBULATORY_CARE_PROVIDER_SITE_OTHER): Payer: 59 | Admitting: Obstetrics and Gynecology

## 2017-08-03 VITALS — BP 162/92 | Wt 288.0 lb

## 2017-08-03 DIAGNOSIS — Z6841 Body Mass Index (BMI) 40.0 and over, adult: Secondary | ICD-10-CM

## 2017-08-03 DIAGNOSIS — O2621 Pregnancy care for patient with recurrent pregnancy loss, first trimester: Secondary | ICD-10-CM

## 2017-08-03 DIAGNOSIS — O99343 Other mental disorders complicating pregnancy, third trimester: Secondary | ICD-10-CM | POA: Diagnosis not present

## 2017-08-03 DIAGNOSIS — O0990 Supervision of high risk pregnancy, unspecified, unspecified trimester: Secondary | ICD-10-CM

## 2017-08-03 DIAGNOSIS — Z87891 Personal history of nicotine dependence: Secondary | ICD-10-CM | POA: Insufficient documentation

## 2017-08-03 DIAGNOSIS — O99210 Obesity complicating pregnancy, unspecified trimester: Secondary | ICD-10-CM

## 2017-08-03 DIAGNOSIS — Z7982 Long term (current) use of aspirin: Secondary | ICD-10-CM | POA: Insufficient documentation

## 2017-08-03 DIAGNOSIS — O99213 Obesity complicating pregnancy, third trimester: Secondary | ICD-10-CM | POA: Diagnosis not present

## 2017-08-03 DIAGNOSIS — Z3A34 34 weeks gestation of pregnancy: Secondary | ICD-10-CM | POA: Insufficient documentation

## 2017-08-03 DIAGNOSIS — O163 Unspecified maternal hypertension, third trimester: Secondary | ICD-10-CM | POA: Diagnosis present

## 2017-08-03 DIAGNOSIS — F419 Anxiety disorder, unspecified: Secondary | ICD-10-CM | POA: Insufficient documentation

## 2017-08-03 DIAGNOSIS — O09523 Supervision of elderly multigravida, third trimester: Secondary | ICD-10-CM | POA: Insufficient documentation

## 2017-08-03 DIAGNOSIS — O26899 Other specified pregnancy related conditions, unspecified trimester: Secondary | ICD-10-CM

## 2017-08-03 DIAGNOSIS — O403XX0 Polyhydramnios, third trimester, not applicable or unspecified: Secondary | ICD-10-CM | POA: Diagnosis not present

## 2017-08-03 DIAGNOSIS — O09512 Supervision of elderly primigravida, second trimester: Secondary | ICD-10-CM

## 2017-08-03 DIAGNOSIS — Z79899 Other long term (current) drug therapy: Secondary | ICD-10-CM | POA: Insufficient documentation

## 2017-08-03 DIAGNOSIS — F329 Major depressive disorder, single episode, unspecified: Secondary | ICD-10-CM | POA: Insufficient documentation

## 2017-08-03 DIAGNOSIS — O2620 Pregnancy care for patient with recurrent pregnancy loss, unspecified trimester: Secondary | ICD-10-CM | POA: Diagnosis present

## 2017-08-03 DIAGNOSIS — O2623 Pregnancy care for patient with recurrent pregnancy loss, third trimester: Secondary | ICD-10-CM | POA: Insufficient documentation

## 2017-08-03 DIAGNOSIS — O10913 Unspecified pre-existing hypertension complicating pregnancy, third trimester: Principal | ICD-10-CM | POA: Insufficient documentation

## 2017-08-03 DIAGNOSIS — O3663X0 Maternal care for excessive fetal growth, third trimester, not applicable or unspecified: Secondary | ICD-10-CM

## 2017-08-03 DIAGNOSIS — Z6791 Unspecified blood type, Rh negative: Secondary | ICD-10-CM

## 2017-08-03 DIAGNOSIS — O10919 Unspecified pre-existing hypertension complicating pregnancy, unspecified trimester: Secondary | ICD-10-CM

## 2017-08-03 DIAGNOSIS — O09513 Supervision of elderly primigravida, third trimester: Secondary | ICD-10-CM

## 2017-08-03 HISTORY — DX: Obesity, unspecified: E66.9

## 2017-08-03 HISTORY — DX: Pregnancy care for patient with recurrent pregnancy loss, unspecified trimester: O26.20

## 2017-08-03 LAB — COMPREHENSIVE METABOLIC PANEL
ALK PHOS: 167 U/L — AB (ref 38–126)
ALT: 15 U/L (ref 14–54)
AST: 23 U/L (ref 15–41)
Albumin: 3 g/dL — ABNORMAL LOW (ref 3.5–5.0)
Anion gap: 10 (ref 5–15)
BILIRUBIN TOTAL: 0.4 mg/dL (ref 0.3–1.2)
BUN: 5 mg/dL — AB (ref 6–20)
CALCIUM: 9.7 mg/dL (ref 8.9–10.3)
CHLORIDE: 103 mmol/L (ref 101–111)
CO2: 22 mmol/L (ref 22–32)
CREATININE: 0.58 mg/dL (ref 0.44–1.00)
Glucose, Bld: 98 mg/dL (ref 65–99)
Potassium: 4 mmol/L (ref 3.5–5.1)
Sodium: 135 mmol/L (ref 135–145)
TOTAL PROTEIN: 6.9 g/dL (ref 6.5–8.1)

## 2017-08-03 LAB — PROTEIN / CREATININE RATIO, URINE
CREATININE, URINE: 45 mg/dL
Protein Creatinine Ratio: 0.16 mg/mg{Cre} — ABNORMAL HIGH (ref 0.00–0.15)
Total Protein, Urine: 7 mg/dL

## 2017-08-03 LAB — CBC
HEMATOCRIT: 32.6 % — AB (ref 35.0–47.0)
Hemoglobin: 11.1 g/dL — ABNORMAL LOW (ref 12.0–16.0)
MCH: 32.5 pg (ref 26.0–34.0)
MCHC: 34 g/dL (ref 32.0–36.0)
MCV: 95.5 fL (ref 80.0–100.0)
Platelets: 236 10*3/uL (ref 150–440)
RBC: 3.41 MIL/uL — AB (ref 3.80–5.20)
RDW: 14 % (ref 11.5–14.5)
WBC: 10 10*3/uL (ref 3.6–11.0)

## 2017-08-03 MED ORDER — LACTATED RINGERS IV SOLN: 500.0000 mL | INTRAVENOUS | Status: DC | PRN

## 2017-08-03 MED ORDER — NIFEDIPINE 10 MG PO CAPS
10.0000 mg | ORAL_CAPSULE | Freq: Once | ORAL | Status: DC
  Filled 2017-08-03: qty 1

## 2017-08-03 MED ORDER — LABETALOL HCL 100 MG PO TABS
ORAL_TABLET | ORAL | Status: AC
  Administered 2017-08-03: 200 mg via ORAL
  Filled 2017-08-03: qty 2

## 2017-08-03 MED ORDER — LABETALOL HCL 200 MG PO TABS: 200.0000 mg | ORAL_TABLET | Freq: Two times a day (BID) | ORAL | 1 refills | Status: DC

## 2017-08-03 MED ORDER — LABETALOL HCL 100 MG PO TABS
200.0000 mg | ORAL_TABLET | Freq: Two times a day (BID) | ORAL | Status: DC
  Administered 2017-08-03: 200 mg via ORAL

## 2017-08-03 NOTE — Progress Notes (Signed)
Routine Prenatal Care Visit  Subjective  Tiffany Chapman is a 36 y.o. G4P0030 at 6739w6d being seen today for ongoing prenatal care.  She is currently monitored for the following issues for this high-risk pregnancy and has Supervision of high risk pregnancy, antepartum; Advanced maternal age, primigravida; Habitual abortion history, antepartum, first trimester; Obesity in pregnancy; BMI 40.0-44.9, adult (HCC); Chronic hypertension affecting pregnancy; Anxiety and depression; Rh negative state in antepartum period; Polyhydramnios affecting pregnancy in third trimester; and Large for dates complicating pregnancy in third trimester, antepartum, not applicable or unspecified fetus on their problem list.  ----------------------------------------------------------------------------------- Patient reports no headaches, vision changes, RUQ or epigastric pain, some increased lower extremity edema.   Contractions: Not present. Vag. Bleeding: None.  Movement: Present. Denies leaking of fluid.  ----------------------------------------------------------------------------------- The following portions of the patient's history were reviewed and updated as appropriate: allergies, current medications, past family history, past medical history, past social history, past surgical history and problem list. Problem list updated.   Objective  Blood pressure (!) 162/92, weight 288 lb (130.6 kg), last menstrual period 11/28/2016. Pregravid weight 251 lb (113.9 kg) Total Weight Gain 37 lb (16.8 kg)  5lbs weight gain  Urinalysis: Urine Protein: Negative Urine Glucose: Negative  Fetal Status: Fetal Heart Rate (bpm): 150   Movement: Present     General:  Alert, oriented and cooperative. Patient is in no acute distress.  Skin: Skin is warm and dry. No rash noted.   Cardiovascular: Normal heart rate noted  Respiratory: Normal respiratory effort, no problems with respiration noted  Abdomen: Soft, gravid, appropriate for  gestational age. Pain/Pressure: Absent     Pelvic:  Cervical exam deferred        Extremities: Normal range of motion.     ental Status: Normal mood and affect. Normal behavior. Normal judgment and thought content.   Baseline: 150 Variability: moderate Accelerations: present Decelerations: absent Tocometry: N/A The patient was monitored for 30 minutes, fetal heart rate tracing was deemed reactive, category I tracing,   Assessment   36 y.o. G4P0030 at 6439w6d by  09/08/2017, by Ultrasound presenting for routine prenatal visit  Plan   4th pregnancy Problems (from 01/14/17 to present)    Problem Noted Resolved   Supervision of high risk pregnancy, antepartum 01/14/2017 by Trellis Momentroy, Hollie, CMA No   Priority:  High     Overview Addendum 07/27/2017  3:44 PM by Natale MilchSchuman, Christanna R, MD     [facility]  Dating LMP = 6 week US Blood type: O/Positive/-- (05/09 1538)   Genetic Screen NIPS: XY normal Antibody:Negative (05/09 1538)  Anatomic US Complete Rubella: <0.90 (05/09 1538)  GTT Early: 121           Third trimester: 135 RPR: Non Reactive (05/09 1538)   Flu vaccine 06/18/17 HBsAg: Negative (05/09 1538)   TDaP vaccine 07/02/17  Rhogam: 06/18/17 HIV: Non Reactive (05/09 1538)   Baby Food  Bottle                 GBS: (For PCN allergy, check sensitivities)  Contraception POPs Pap: 01/14/17 NIL HPV negative  Circumcision  Varicella: Non immune  Pediatrician    Support Person      HIgh risk for obesity with BMI=40, CHTN, habitual abortions, AMA, and anxiety/depression Slight anemia on 28 wk labs--Lm to add Fe supp       Polyhydramnios affecting pregnancy in third trimester 07/02/2017 by Oswaldo ConroySchmid, Jacelyn Y, CNM No   Rh negative state in antepartum  period 01/27/2017 by Farrel ConnersGutierrez, Colleen, CNM No   Overview Signed 06/18/2017 12:04 PM by Vena AustriaStaebler, Chue Berkovich, MD    Rhogam 06/18/17      Advanced maternal age, primigravida 01/18/2017 by Farrel ConnersGutierrez, Colleen, CNM No   Overview Signed  06/18/2017 12:11 PM by Vena AustriaStaebler, Inola Lisle, MD    Informaseq normal XY      Obesity in pregnancy 01/18/2017 by Farrel ConnersGutierrez, Colleen, CNM No   Overview Addendum 07/26/2017 10:22 PM by Natale MilchSchuman, Christanna R, MD    BMI >=40 [X]  bASA (>12 weeks) [ ]  Growth u/s 28 [ ] , 32 [ ] , 36 weeks [ ]  [ ]  NST/AFI weekly 36+ weeks (36[] , 37[] , 38[] , 39[] , 40[] ) [ ]  IOL by 41 weeks (scheduled, prn [] )        BMI 40.0-44.9, adult (HCC) 01/18/2017 by Farrel ConnersGutierrez, Colleen, CNM No   Chronic hypertension affecting pregnancy 01/18/2017 by Farrel ConnersGutierrez, Colleen, CNM No   Overview Addendum 07/27/2017  9:00 PM by Natale MilchSchuman, Christanna R, MD    Monitor blood pressures. CMP and TSH WNL. Baseline PC ratio 98 mgm  [X]  Aspirin 81 mg daily after 12 weeks; discontinue after 36 weeks [X]  baseline labs with CBC, CMP, urine protein/creatinine ratio [ ]  no BP meds unless BPs become elevated [ ]  ultrasound for growth at [X]  28 ordered 06/18/17, [x]  32, 36 weeks   Current antihypertensives:  None   Baseline and surveillance labs ([facility], refresh links as needed)  Lab Results  Component Value Date   PLT 283 01/23/2017   CREATININE 0.65 01/23/2017   AST 11 01/23/2017   ALT 7 01/23/2017    Antenatal Testing CHTN - O10.919  Group I  BP < 140/90, no preeclampsia, AGA,  nml AFV, +/- meds    Group II BP > 140/90, on meds, no preeclampsia, AGA, nml AFV  20-28-34-38  20-24-28-32-35-38  32//2 x wk  28//BPP wkly then 32//2 x wk  40 no meds; 39 meds  PRN or 37             Preterm labor symptoms and general obstetric precautions including but not limited to vaginal bleeding, contractions, leaking of fluid and fetal movement were reviewed in detail with the patient. Please refer to After Visit Summary for other counseling recommendations.  - continue weekly NST/AFI - BP significantly elevated, negative proteinuria, to L&D for labs had baseline labs at start of pregnancy.  Repeat 144/94 - Given CHTN start on  labetalol if consistently elevated, move APT to twice weekly  Return in about 1 week (around 08/10/2017) for NST/AFI/ROB.

## 2017-08-03 NOTE — Progress Notes (Signed)
ROB NST B/P retake 144/94 KJ

## 2017-08-03 NOTE — OB Triage Note (Signed)
Ms. Tiffany BergeronKey sent over from office for BP evaluation, pt reports elevated BP in office and prior to pregnancy, and had been on medication prior to pregnancy. Denies headache, visual changes, has localized swelling in bilateral ankles.

## 2017-08-03 NOTE — Final Progress Note (Addendum)
Physician Final Progress Note  Patient ID: Tiffany Chapman MRN: [id number] DOB/AGE: 1981/08/28 36 y.o.  Admit date: 08/03/2017 Admitting provider: Conard Novak, MD/ Gasper Lloyd. Sharen Hones, CNM Discharge date: 08/03/2017   Admission Diagnoses: IUP at 34wk6 days with elevated blood pressure History of chronic hypertension R/O preeclampsia  Discharge Diagnoses:  IUP at 34wk6d with chronic hypertension Elevated blood pressures in the third trimester  Consults: None  Significant Findings/ Diagnostic Studies: 36 year old G71 P0030 with EDC=09/08/2017 by a 6wk1d ultrasound sent  from office at 34wk 6days for further evaluation of elevated blood pressure 162/92 then 144/94. Has a history of chronic hypertension and has been on labetalol in the past.It was discontinued about 6 months before her menses due to low blood pressures.  She denies headaches, visual changes, RUQ pain, nausea or vomiting. Baby is active. She has noticed more pedal edema. Her prenatal care has also been complicated by AMA (normal XY on Informaseq),  BMI>40 (she has gained 37#), anxiety/depression (currently on Lexapro), and polyhydraminos. She is O negative blood type and did receive Rhogam 06/18/2017. EFW at 34 weeks was 5#13oz   Clinic [facility] Prenatal Labs  Dating LMP = 6 week [place] Blood type: O/negative-- (05/09 1538)   Genetic Screen NIPS: XY normal Antibody:Negative (05/09 1538)  Anatomic [place] Complete Rubella: Immune  GTT Early: 121           Third trimester: 135 RPR: Non Reactive (05/09 1538)   Flu vaccine 06/18/17 HBsAg: Negative (05/09 1538)   TDaP vaccine 07/02/17  Rhogam: 06/18/17 HIV: Non Reactive (05/09 1538)   Baby Food  Bottle                 GBS: (For PCN allergy, check sensitivities)  Contraception POPs Pap: 01/14/17 NIL   Circumcision  Varicella: immune  Pediatrician    Support Person        No current facility-administered medications on file prior to encounter.    Current Outpatient Medications on  File Prior to Encounter  Medication Sig Dispense Refill  . aspirin 81 MG chewable tablet Chew 81 mg by mouth daily.    Marland Kitchen escitalopram (LEXAPRO) 10 MG tablet Take 1 tablet (10 mg total) by mouth daily. 90 tablet 3  . ferrous sulfate 325 (65 FE) MG tablet Take 325 mg by mouth daily with breakfast.    . Prenatal Vit-Fe Fumarate-FA (MULTIVITAMIN-PRENATAL) 27-0.8 MG TABS tablet Take 1 tablet by mouth daily at 12 noon.     Past Medical History:  Diagnosis Date  . Anxiety   . 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 Dysmenorrhea   . Habitual aborter, currently pregnant   . Hypertension    labetalol discontinued in December 2017  . Missed abortion 02/2016, 06/2016  . Obesity    Past Surgical History:  Procedure Laterality Date  . BREAST ENHANCEMENT SURGERY Bilateral   . COLPOSCOPY  01/01/2012   Benign  . DILATION AND EVACUATION N/A 02/15/2016   Procedure: DILATATION AND EVACUATION;  Surgeon: Vena Austria, MD;  Location: [facility] ORS;  Service: Gynecology;  Laterality: N/A;  . DILATION AND EVACUATION N/A 06/10/2016   Procedure: DILATATION AND EVACUATION;  Surgeon: Vena Austria, MD;  Location: [facility] ORS;  Service: Gynecology;  Laterality: N/A;  . TONSILLECTOMY  age 39   Social History   Socioeconomic History  . Marital status: Married    Spouse name: Not on file  . Number of children: 0  . Years of education: Not on file  .  Highest education level: Not on file  Social Needs  . Financial resource strain: Not on file  . Food insecurity - worry: Not on file  . Food insecurity - inability: Not on file  . Transportation needs - medical: Not on file  . Transportation needs - non-medical: Not on file  Occupational History  . Not on file  Tobacco Use  . Smoking status: Former Smoker    Packs/day: 0.50    Years: 15.00    Pack years: 7.50    Types: Cigarettes    Last attempt to quit: 12/30/2016    Years since quitting: 0.5  . Smokeless tobacco: Former Engineer, waterUser  Substance  and Sexual Activity  . Alcohol use: No  . Drug use: No  . Sexual activity: Yes    Birth control/protection: None  Other Topics Concern  . Not on file  Social History Narrative   Greig Castillandrew is FOB, and involved with care   Physical Exam:  Vital signs:  Patient Vitals for the past 24 hrs:  BP Temp Pulse  08/03/17 2015 126/80 - 100  08/03/17 2000 132/76 - 99  08/03/17 1945 133/82 - 99  08/03/17 1930 (!) 140/93 - 98  08/03/17 1915 (!) 143/94 - 100  08/03/17 1900 (!) 147/96 - 96  08/03/17 1824 (!) 159/111 - (!) 106  08/03/17 1809 (!) 156/90 - 98  08/03/17 1754 (!) 167/95 98.3 F (36.8 C) (!) 102  t received labetalol 200 mgm at 1815 General: gravid female in NAD Neck: no thyromegaly Heart: RRR without murmur Lungs: CTAB Abdomen: gravid/NT,  difficulty feeling fetal parts FHT: 145 with accelerations to 170s to 180, moderate variability, no decelerations Toco: mild irritability  Ultrasound at bedside: transverse presentation with fetal head on maternal right  Results for orders placed or performed during the hospital encounter of 08/03/17 (from the past 24 hour(s))  CBC     Status: Abnormal   Collection Time: 08/03/17  6:10 PM  Result Value Ref Range   WBC 10.0 3.6 - 11.0 K/uL   RBC 3.41 (L) 3.80 - 5.20 MIL/uL   Hemoglobin 11.1 (L) 12.0 - 16.0 g/dL   HCT 56.232.6 (L) 13.035.0 - 86.547.0 %   MCV 95.5 80.0 - 100.0 fL   MCH 32.5 26.0 - 34.0 pg   MCHC 34.0 32.0 - 36.0 g/dL   RDW 78.414.0 69.611.5 - 29.514.5 %   Platelets 236 150 - 440 K/uL  Comprehensive metabolic panel     Status: Abnormal   Collection Time: 08/03/17  6:10 PM  Result Value Ref Range   Sodium 135 135 - 145 mmol/L   Potassium 4.0 3.5 - 5.1 mmol/L   Chloride 103 101 - 111 mmol/L   CO2 22 22 - 32 mmol/L   Glucose, Bld 98 65 - 99 mg/dL   BUN 5 (L) 6 - 20 mg/dL   Creatinine, Ser 2.840.58 0.44 - 1.00 mg/dL   Calcium 9.7 8.9 - 13.210.3 mg/dL   Total Protein 6.9 6.5 - 8.1 g/dL   Albumin 3.0 (L) 3.5 - 5.0 g/dL   AST 23 15 - 41 U/L   ALT 15 14  - 54 U/L   Alkaline Phosphatase 167 (H) 38 - 126 U/L   Total Bilirubin 0.4 0.3 - 1.2 mg/dL   GFR calc non Af Amer >60 >60 mL/min   GFR calc Af Amer >60 >60 mL/min   Anion gap 10 5 - 15  Type and screen     Status: None (Preliminary result)  Collection Time: 08/03/17  6:10 PM  Result Value Ref Range   ABO/RH(D) O NEG    Antibody Screen POS    Sample Expiration 08/06/2017    Antibody Identification PASSIVELY ACQUIRED ANTI-D    Unit Number [id number]    Blood Component Type RED CELLS,LR    Unit division 00    Status of Unit ALLOCATED    Transfusion Status OK TO TRANSFUSE    Crossmatch Result COMPATIBLE    Unit Number [id number]    Blood Component Type RBC LR PHER1    Unit division 00    Status of Unit ALLOCATED    Transfusion Status OK TO TRANSFUSE    Crossmatch Result COMPATIBLE   Protein / creatinine ratio, urine     Status: Abnormal   Collection Time: 08/03/17  6:21 PM  Result Value Ref Range   Creatinine, Urine 45 mg/dL   Total Protein, Urine 7 mg/dL   Protein Creatinine Ratio 0.16 (H) 0.00 - 0.15 mg/mg[Cre]  A: IUP at 34 week 6day with chronic hypertension Elevated blood pressures responded to labetalol  No evidence of preeclampsia FWB: cat 1 tracing Transverse presentation/ polyhydraminos  P: DC home on labetalol 200 mgm BID Follow up for NST/AFI and blood pressure check in 3-4 days Start maternity disability   Procedures: none  Discharge Condition: stable  Disposition: 01-Home or Self Care  Diet: Regular diet  Discharge Activity: Ambulate in house   Allergies as of 08/03/2017   No Known Allergies     Medication List    TAKE these medications   aspirin 81 MG chewable tablet Chew 81 mg by mouth daily.   escitalopram 10 MG tablet Commonly known as:  LEXAPRO Take 1 tablet (10 mg total) by mouth daily.   ferrous sulfate 325 (65 FE) MG tablet Take 325 mg by mouth daily with breakfast.   labetalol 200 MG tablet Commonly known as:   NORMODYNE Take 1 tablet (200 mg total) by mouth 2 (two) times daily.   multivitamin-prenatal 27-0.8 MG Tabs tablet Take 1 tablet by mouth daily at 12 noon.      Follow-up Information    [facility] [place] Schedule an appointment as soon as possible for a visit on 08/06/2017.   Why:  follow up with [facility] on thursday or friday. Call for an appointment. Contact information: [address] [place] [address] [phone]           Total time spent taking care of this patient: 20 minutes  Signed: Farrel Connersolleen Traevon Meiring 08/03/2017, 9:45 PM

## 2017-08-04 ENCOUNTER — Encounter: Payer: Self-pay | Admitting: Obstetrics and Gynecology

## 2017-08-05 LAB — RPR: RPR: NONREACTIVE

## 2017-08-06 ENCOUNTER — Other Ambulatory Visit: Payer: 59

## 2017-08-07 ENCOUNTER — Ambulatory Visit (INDEPENDENT_AMBULATORY_CARE_PROVIDER_SITE_OTHER): Payer: 59 | Admitting: Certified Nurse Midwife

## 2017-08-07 ENCOUNTER — Ambulatory Visit (INDEPENDENT_AMBULATORY_CARE_PROVIDER_SITE_OTHER): Payer: 59

## 2017-08-07 ENCOUNTER — Other Ambulatory Visit: Payer: Self-pay | Admitting: Obstetrics and Gynecology

## 2017-08-07 VITALS — BP 144/94 | Wt 286.0 lb

## 2017-08-07 DIAGNOSIS — O0990 Supervision of high risk pregnancy, unspecified, unspecified trimester: Secondary | ICD-10-CM

## 2017-08-07 DIAGNOSIS — O10919 Unspecified pre-existing hypertension complicating pregnancy, unspecified trimester: Secondary | ICD-10-CM | POA: Diagnosis not present

## 2017-08-07 DIAGNOSIS — O403XX0 Polyhydramnios, third trimester, not applicable or unspecified: Secondary | ICD-10-CM | POA: Diagnosis not present

## 2017-08-07 DIAGNOSIS — O3663X0 Maternal care for excessive fetal growth, third trimester, not applicable or unspecified: Secondary | ICD-10-CM | POA: Diagnosis not present

## 2017-08-07 DIAGNOSIS — O2621 Pregnancy care for patient with recurrent pregnancy loss, first trimester: Secondary | ICD-10-CM

## 2017-08-07 DIAGNOSIS — O0993 Supervision of high risk pregnancy, unspecified, third trimester: Secondary | ICD-10-CM

## 2017-08-07 DIAGNOSIS — Z3A34 34 weeks gestation of pregnancy: Secondary | ICD-10-CM | POA: Diagnosis not present

## 2017-08-07 DIAGNOSIS — O99210 Obesity complicating pregnancy, unspecified trimester: Secondary | ICD-10-CM | POA: Diagnosis not present

## 2017-08-07 DIAGNOSIS — Z3A35 35 weeks gestation of pregnancy: Secondary | ICD-10-CM

## 2017-08-07 DIAGNOSIS — O09512 Supervision of elderly primigravida, second trimester: Secondary | ICD-10-CM

## 2017-08-07 NOTE — Progress Notes (Signed)
HROB at 35 wk3d. Follow up from 11/26 hospital observation for elevated blood pressures. Labs all normal,no neurological symptoms, but severe range blood pressures-probable worsening of CHTN Started on labetalol 200 mgm BID and blood pressures normal when discharged from hospital BP today 144/94. Weight down 2#. Denies headache, CP, RUQ pain or visual changes. Has nasal congestion and rhinorrhea ?allergy vs URI AFI 27.85cm/ cephalic presentation (was transverse on 11/26) NST reactive with baseline 140 and accelerations to 160-170s, moderate variability A/P Chronic hypertension on meds: mild range blood pressures on labetalol acceptable. Keep current dose( consulted Dr Jean RosenthalJackson) Polyhydraminos and unstable lie-monitor presentation during AFIs NST and BP check twice weekly and AFI weekly Delivery planning/timing to be discussed with MD next week. Can use antihistimine or Flonase for nasal congestion Farrel Connersolleen Natalya Domzalski, CNM

## 2017-08-07 NOTE — Progress Notes (Signed)
Pt reports no problems. AFI today. F/u from L&D visit.

## 2017-08-09 LAB — TYPE AND SCREEN
ABO/RH(D): O NEG
ANTIBODY SCREEN: POSITIVE
UNIT DIVISION: 0
Unit division: 0

## 2017-08-09 LAB — BPAM RBC
BLOOD PRODUCT EXPIRATION DATE: 201812252359
Blood Product Expiration Date: 201812252359
UNIT TYPE AND RH: 9500
Unit Type and Rh: 9500

## 2017-08-11 ENCOUNTER — Ambulatory Visit (INDEPENDENT_AMBULATORY_CARE_PROVIDER_SITE_OTHER): Payer: 59 | Admitting: Obstetrics and Gynecology

## 2017-08-11 VITALS — BP 120/78 | Wt 286.0 lb

## 2017-08-11 DIAGNOSIS — O09513 Supervision of elderly primigravida, third trimester: Secondary | ICD-10-CM

## 2017-08-11 DIAGNOSIS — Z6841 Body Mass Index (BMI) 40.0 and over, adult: Secondary | ICD-10-CM

## 2017-08-11 DIAGNOSIS — Z3685 Encounter for antenatal screening for Streptococcus B: Secondary | ICD-10-CM

## 2017-08-11 DIAGNOSIS — O2621 Pregnancy care for patient with recurrent pregnancy loss, first trimester: Secondary | ICD-10-CM

## 2017-08-11 DIAGNOSIS — O0990 Supervision of high risk pregnancy, unspecified, unspecified trimester: Secondary | ICD-10-CM

## 2017-08-11 DIAGNOSIS — O0993 Supervision of high risk pregnancy, unspecified, third trimester: Secondary | ICD-10-CM

## 2017-08-11 DIAGNOSIS — O3663X0 Maternal care for excessive fetal growth, third trimester, not applicable or unspecified: Secondary | ICD-10-CM

## 2017-08-11 DIAGNOSIS — Z3A36 36 weeks gestation of pregnancy: Secondary | ICD-10-CM

## 2017-08-11 NOTE — Progress Notes (Signed)
NST, GBS Aptiima today. No complaints.

## 2017-08-11 NOTE — Patient Instructions (Signed)
Preventive care 

## 2017-08-11 NOTE — Progress Notes (Signed)
Routine Prenatal Care Visit  Subjective  Tiffany Chapman is a 36 y.o. G4P0030 at 861w0d being seen today for ongoing prenatal care.  She is currently monitored for the following issues for this high-risk pregnancy and has Supervision of high risk pregnancy, antepartum; Advanced maternal age, primigravida; Habitual abortion history, antepartum, first trimester; Obesity in pregnancy; BMI 40.0-44.9, adult (HCC); Chronic hypertension affecting pregnancy; Anxiety and depression; Rh negative state in antepartum period; Polyhydramnios affecting pregnancy in third trimester; Large for dates complicating pregnancy in third trimester, antepartum, not applicable or unspecified fetus; Elevated blood pressure affecting pregnancy in third trimester, antepartum; and Habitual aborter, currently pregnant on their problem list.  ----------------------------------------------------------------------------------- Patient reports no complaints.   Contractions: Not present. Vag. Bleeding: None.  Movement: Present. Denies leaking of fluid.  ----------------------------------------------------------------------------------- The following portions of the patient's history were reviewed and updated as appropriate: allergies, current medications, past family history, past medical history, past social history, past surgical history and problem list. Problem list updated.   Objective  Blood pressure 120/78, weight 286 lb (129.7 kg), last menstrual period 11/28/2016. Pregravid weight 251 lb (113.9 kg) Total Weight Gain 35 lb (15.9 kg) Urinalysis: Urine Protein: Negative Urine Glucose: Negative  Fetal Status: Fetal Heart Rate (bpm): 145   Movement: Present  Presentation: Vertex  General:  Alert, oriented and cooperative. Patient is in no acute distress.  Skin: Skin is warm and dry. No rash noted.   Cardiovascular: Normal heart rate noted  Respiratory: Normal respiratory effort, no problems with respiration noted    Abdomen: Soft, gravid, appropriate for gestational age. Pain/Pressure: Present     Pelvic:  Cervical exam performed Dilation: Closed Effacement (%): 50 Station: -3  Extremities: Normal range of motion.     ental Status: Normal mood and affect. Normal behavior. Normal judgment and thought content.   Baseline: 145  Variability: moderate Accelerations: present Decelerations: absent Tocometry: N/A The patient was monitored for 30 minutes, fetal heart rate tracing was deemed reactive, category I tracing,  Assessment   36 y.o. G4P0030 at 7261w0d by  09/08/2017, by Ultrasound presenting for routine prenatal visit  Plan   4th pregnancy Problems (from 01/14/17 to present)    Problem Noted Resolved   Supervision of high risk pregnancy, antepartum 01/14/2017 by Trellis Momentroy, Hollie, CMA No   Priority:  High     Overview Addendum 07/27/2017  3:44 PM by Natale MilchSchuman, Christanna R, MD     [facility]  Dating LMP = 6 week US Blood type: O/Positive/-- (05/09 1538)   Genetic Screen NIPS: XY normal Antibody:Negative (05/09 1538)  Anatomic US Complete Rubella: <0.90 (05/09 1538)  GTT Early: 121           Third trimester: 135 RPR: Non Reactive (05/09 1538)   Flu vaccine 06/18/17 HBsAg: Negative (05/09 1538)   TDaP vaccine 07/02/17  Rhogam: 06/18/17 HIV: Non Reactive (05/09 1538)   Baby Food  Bottle                 GBS: (For PCN allergy, check sensitivities)  Contraception POPs Pap: 01/14/17 NIL HPV negative  Circumcision  Varicella: Non immune  Pediatrician    Support Person      HIgh risk for obesity with BMI=40, CHTN, habitual abortions, AMA, and anxiety/depression Slight anemia on 28 wk labs--Lm to add Fe supp       Polyhydramnios affecting pregnancy in third trimester 07/02/2017 by Oswaldo ConroySchmid, Jacelyn Y, CNM No   Rh negative state in antepartum period 01/27/2017  by Farrel ConnersGutierrez, Colleen, CNM No   Overview Signed 06/18/2017 12:04 PM by Vena AustriaStaebler, Brady Plant, MD    Rhogam 06/18/17      Advanced  maternal age, primigravida 01/18/2017 by Farrel ConnersGutierrez, Colleen, CNM No   Overview Signed 06/18/2017 12:11 PM by Vena AustriaStaebler, Glorianna Gott, MD    Informaseq normal XY      Obesity in pregnancy 01/18/2017 by Farrel ConnersGutierrez, Colleen, CNM No   Overview Addendum 07/26/2017 10:22 PM by Natale MilchSchuman, Christanna R, MD    BMI >=40 [X]  bASA (>12 weeks) [ ]  Growth u/s 28 [ ] , 32 [ ] , 36 weeks [ ]  [ ]  NST/AFI weekly 36+ weeks (36[] , 37[] , 38[] , 39[] , 40[] ) [ ]  IOL by 41 weeks (scheduled, prn [] )        BMI 40.0-44.9, adult (HCC) 01/18/2017 by Farrel ConnersGutierrez, Colleen, CNM No   Chronic hypertension affecting pregnancy 01/18/2017 by Farrel ConnersGutierrez, Colleen, CNM No   Overview Addendum 07/27/2017  9:00 PM by Natale MilchSchuman, Christanna R, MD    Monitor blood pressures. CMP and TSH WNL. Baseline PC ratio 98 mgm  [X]  Aspirin 81 mg daily after 12 weeks; discontinue after 36 weeks [X]  baseline labs with CBC, CMP, urine protein/creatinine ratio [ ]  no BP meds unless BPs become elevated [ ]  ultrasound for growth at [X]  28 ordered 06/18/17, [x]  32, 36 weeks   Current antihypertensives:  None   Baseline and surveillance labs ([facility], refresh links as needed)  Lab Results  Component Value Date   PLT 283 01/23/2017   CREATININE 0.65 01/23/2017   AST 11 01/23/2017   ALT 7 01/23/2017    Antenatal Testing CHTN - O10.919  Group I  BP < 140/90, no preeclampsia, AGA,  nml AFV, +/- meds    Group II BP > 140/90, on meds, no preeclampsia, AGA, nml AFV  20-28-34-38  20-24-28-32-35-38  32//2 x wk  28//BPP wkly then 32//2 x wk  40 no meds; 39 meds  PRN or 37             Term labor symptoms and general obstetric precautions including but not limited to vaginal bleeding, contractions, leaking of fluid and fetal movement were reviewed in detail with the patient. Please refer to After Visit Summary for other counseling recommendations.  - cervix closed -- normotensive today asymptomatic - NST reactive - GBS culture Return  in about 3 days (around 08/14/2017) for NST/AFI 3 days.

## 2017-08-13 LAB — STREP GP B NAA: STREP GROUP B AG: NEGATIVE

## 2017-08-14 ENCOUNTER — Other Ambulatory Visit: Payer: Self-pay | Admitting: Obstetrics and Gynecology

## 2017-08-14 ENCOUNTER — Ambulatory Visit (INDEPENDENT_AMBULATORY_CARE_PROVIDER_SITE_OTHER): Payer: 59 | Admitting: Obstetrics and Gynecology

## 2017-08-14 ENCOUNTER — Ambulatory Visit: Payer: 59

## 2017-08-14 VITALS — BP 130/80 | Wt 286.0 lb

## 2017-08-14 DIAGNOSIS — O09513 Supervision of elderly primigravida, third trimester: Secondary | ICD-10-CM

## 2017-08-14 DIAGNOSIS — O3663X0 Maternal care for excessive fetal growth, third trimester, not applicable or unspecified: Secondary | ICD-10-CM

## 2017-08-14 DIAGNOSIS — Z3A36 36 weeks gestation of pregnancy: Secondary | ICD-10-CM

## 2017-08-14 DIAGNOSIS — O0990 Supervision of high risk pregnancy, unspecified, unspecified trimester: Secondary | ICD-10-CM

## 2017-08-14 DIAGNOSIS — O163 Unspecified maternal hypertension, third trimester: Secondary | ICD-10-CM

## 2017-08-14 DIAGNOSIS — O10919 Unspecified pre-existing hypertension complicating pregnancy, unspecified trimester: Secondary | ICD-10-CM

## 2017-08-14 DIAGNOSIS — Z6841 Body Mass Index (BMI) 40.0 and over, adult: Secondary | ICD-10-CM

## 2017-08-14 DIAGNOSIS — O403XX0 Polyhydramnios, third trimester, not applicable or unspecified: Secondary | ICD-10-CM

## 2017-08-14 DIAGNOSIS — O99210 Obesity complicating pregnancy, unspecified trimester: Secondary | ICD-10-CM

## 2017-08-14 DIAGNOSIS — O0993 Supervision of high risk pregnancy, unspecified, third trimester: Secondary | ICD-10-CM

## 2017-08-14 NOTE — Progress Notes (Addendum)
Routine Prenatal Care Visit  Subjective  Tiffany Chapman is a 36 y.o. G4P0030 at 9753w3d being seen today for ongoing prenatal care.  She is currently monitored for the following issues for this high-risk pregnancy and has Supervision of high risk pregnancy, antepartum; Advanced maternal age, primigravida; Habitual abortion history, antepartum, first trimester; Obesity in pregnancy; BMI 40.0-44.9, adult (HCC); Chronic hypertension affecting pregnancy; Anxiety and depression; Rh negative state in antepartum period; Polyhydramnios affecting pregnancy in third trimester; Large for dates complicating pregnancy in third trimester, antepartum, not applicable or unspecified fetus; Elevated blood pressure affecting pregnancy in third trimester, antepartum; and Habitual aborter, currently pregnant on their problem list.  ----------------------------------------------------------------------------------- Patient feeling well. Good fetal movement. AFI today was 30. NST is reactive. Patient denies headache, vision changes, or RUQ pain.  Contractions: Not present. Vag. Bleeding: None.  Movement: Present. Denies leaking of fluid.     Objective   Vitals:   08/14/17 1519  BP: 130/80  Weight: 286 lb (129.7 kg)   Pregravid Weight: 251 lb (113.9 kg)  Total Weight Gain: 35 lb (15.9 kg)  Urinalysis: Urine Protein: Trace Urine Glucose: Negative  Fetal Status: Fetal Heart Rate (bpm): 145   Movement: Present  Presentation: Vertex  General: Alert: Oriented and cooperative. Patient is in no acute distress. Skin: Skin is warm and dry. No rash noted.  Cardiovascular: Regular rate and rhythm. No murmurs, gallops, or rubs. Respiratory: Normal respiratory effort, no problems with respiration noted Abdomen: Soft, gravid, appropriate for gestational age. Pain/Pressure: Present Pelvic: Cervical exam deferred       Extremeties: Normal range of motion.    Mental Status: Normal mood and affect. Normal behavior. Normal  judgment and thought content.  Assessment   36 y.o. G4P0030 at 7053w3d by  09/08/2017, by Ultrasound presenting for routine prenatal visit  Plan   4th pregnancy Problems (from 01/14/17 to present)    Problem Noted Resolved   Polyhydramnios affecting pregnancy in third trimester 07/02/2017 by Oswaldo ConroySchmid, Jacelyn Y, CNM No   Rh negative state in antepartum period 01/27/2017 by Farrel ConnersGutierrez, Colleen, CNM No   Overview Signed 06/18/2017 12:04 PM by Vena AustriaStaebler, Andreas, MD    Rhogam 06/18/17      Advanced maternal age, primigravida 01/18/2017 by Farrel ConnersGutierrez, Colleen, CNM No   Overview Signed 06/18/2017 12:11 PM by Vena AustriaStaebler, Andreas, MD    Informaseq normal XY      Obesity in pregnancy 01/18/2017 by Farrel ConnersGutierrez, Colleen, CNM No   Overview Addendum 07/26/2017 10:22 PM by Natale MilchSchuman, Kevan Prouty R, MD    BMI >=40 [X]  bASA (>12 weeks) [ ]  Growth u/s 28 [ ] , 32 [ ] , 36 weeks [ ]  [ ]  NST/AFI weekly 36+ weeks (36[] , 37[] , 38[] , 39[] , 40[] ) [ ]  IOL by 41 weeks (scheduled, prn [] )        BMI 40.0-44.9, adult (HCC) 01/18/2017 by Farrel ConnersGutierrez, Colleen, CNM No   Chronic hypertension affecting pregnancy 01/18/2017 by Farrel ConnersGutierrez, Colleen, CNM No   Overview Addendum 08/14/2017  4:25 PM by Natale MilchSchuman, Minta Fair R, MD    Monitor blood pressures. CMP and TSH WNL. Baseline PC ratio 98 mgm  [X]  Aspirin 81 mg daily after 12 weeks; discontinue after 36 weeks [X]  baseline labs with CBC, CMP, urine protein/creatinine ratio [ ]  no BP meds unless BPs become elevated [ ]  ultrasound for growth at [X]  28 ordered, [x]  32, 36 [ ]  weeks   Current antihypertensives:  None   Baseline and surveillance labs [facility], refresh links as needed)  Lab Results  Component Value Date   PLT 283 01/23/2017   CREATININE 0.65 01/23/2017   AST 11 01/23/2017   ALT 7 01/23/2017    Antenatal Testing CHTN - O10.919  Group I  BP < 140/90, no preeclampsia, AGA,  nml AFV, +/- meds    Group II BP > 140/90, on meds, no preeclampsia, AGA, nml  AFV  20-28-34-38  20-24-28-32-35-38  32//2 x wk  28//BPP wkly then 32//2 x wk  40 no meds; 39 meds  PRN or 37         Supervision of high risk pregnancy, antepartum 01/14/2017 by Trellis Momentroy, Hollie, CMA No   Overview Addendum 07/27/2017  3:44 PM by Natale MilchSchuman, Daria Mcmeekin R, MD     [facility]  Dating LMP = 6 week US Blood type: O/Positive/-- (05/09 1538)   Genetic Screen NIPS: XY normal Antibody:Negative (05/09 1538)  Anatomic US Complete Rubella: <0.90 (05/09 1538)  GTT Early: 121           Third trimester: 135 RPR: Non Reactive (05/09 1538)   Flu vaccine 06/18/17 HBsAg: Negative (05/09 1538)   TDaP vaccine 07/02/17  Rhogam: 06/18/17 HIV: Non Reactive (05/09 1538)   Baby Food  Bottle                 GBS: (For PCN allergy, check sensitivities)  Contraception POPs Pap: 01/14/17 NIL HPV negative  Circumcision  Varicella: Non immune  Pediatrician    Support Person      HIgh risk for obesity with BMI=40, CHTN, habitual abortions, AMA, and anxiety/depression Slight anemia on 28 wk labs--Lm to add Fe supp          Preterm labor symptoms and general obstetric precautions including but not limited to vaginal bleeding, contractions, leaking of fluid and fetal movement were reviewed in detail with the patient.  Please refer to After Visit Summary for other counseling recommendations.   Growth US next week.  Return in about 4 days (around 08/18/2017) for return OB as planned, growth US next friday.  Adelene Idlerhristanna Jaimen Melone M.D. 08/14/17 4:26 PM

## 2017-08-15 ENCOUNTER — Encounter: Payer: Self-pay | Admitting: Obstetrics and Gynecology

## 2017-08-18 ENCOUNTER — Ambulatory Visit (INDEPENDENT_AMBULATORY_CARE_PROVIDER_SITE_OTHER): Payer: 59 | Admitting: Obstetrics and Gynecology

## 2017-08-18 VITALS — BP 142/88 | Wt 286.0 lb

## 2017-08-18 DIAGNOSIS — O26899 Other specified pregnancy related conditions, unspecified trimester: Secondary | ICD-10-CM

## 2017-08-18 DIAGNOSIS — O0990 Supervision of high risk pregnancy, unspecified, unspecified trimester: Secondary | ICD-10-CM

## 2017-08-18 DIAGNOSIS — O10919 Unspecified pre-existing hypertension complicating pregnancy, unspecified trimester: Secondary | ICD-10-CM | POA: Diagnosis not present

## 2017-08-18 DIAGNOSIS — O403XX0 Polyhydramnios, third trimester, not applicable or unspecified: Secondary | ICD-10-CM

## 2017-08-18 DIAGNOSIS — O99210 Obesity complicating pregnancy, unspecified trimester: Secondary | ICD-10-CM

## 2017-08-18 DIAGNOSIS — O09513 Supervision of elderly primigravida, third trimester: Secondary | ICD-10-CM

## 2017-08-18 DIAGNOSIS — O09899 Supervision of other high risk pregnancies, unspecified trimester: Secondary | ICD-10-CM

## 2017-08-18 DIAGNOSIS — Z3A37 37 weeks gestation of pregnancy: Secondary | ICD-10-CM

## 2017-08-18 DIAGNOSIS — Z6791 Unspecified blood type, Rh negative: Secondary | ICD-10-CM

## 2017-08-18 DIAGNOSIS — O2621 Pregnancy care for patient with recurrent pregnancy loss, first trimester: Secondary | ICD-10-CM

## 2017-08-18 NOTE — H&P (Signed)
Obstetric H&P   Chief Complaint: Induction Scheduling and ROB  Prenatal Care Provider: [facility]  History of Present Illness: 36 y.o. G4P0030 [redacted]w[redacted]d by 09/08/2017, by Ultrasound presenting today for routine follow up.  She has a history of morbid obesity, advanced maternal age, and chronic hypertension with worsening blood pressures in the third trimester requiring starting of antihypertensive agent "labetalol".  No headaches, no vision changes, no RUQ or epigastric pain.  +FM, no LOF, no VB, irregular contractions.  Pregravid weight 251 lb (113.9 kg) Total Weight Gain 35 lb (15.9 kg)  4th pregnancy Problems (from 01/14/17 to present)    Problem Noted Resolved   Supervision of high risk pregnancy, antepartum 01/14/2017 by Trellis Moment, CMA No   Priority:  High     Overview Addendum 08/15/2017 10:52 AM by Vena Austria, MD     Clinic [facility] Prenatal Labs  Dating LMP = 6 week [place] Blood type: O/Positive/-- (05/09 1538)   Genetic Screen NIPS: XY normal Antibody:Negative (05/09 1538)  Anatomic [place] Complete Rubella: <0.90 (05/09 1538)  GTT Early: 121           Third trimester: 135 RPR: Non Reactive (05/09 1538)   Flu vaccine 06/18/17 HBsAg: Negative (05/09 1538)   TDaP vaccine 07/02/17  Rhogam: 06/18/17 HIV: Non Reactive (05/09 1538)   Baby Food  Bottle                 GBS: negative  Contraception POPs Pap: 01/14/17 NIL HPV negative  Circumcision  Varicella: Non immune  Pediatrician    Support Person      HIgh risk for obesity with BMI=40, CHTN, habitual abortions, AMA, and anxiety/depression Slight anemia on 28 wk labs--Lm to add Fe supp       Polyhydramnios affecting pregnancy in third trimester 07/02/2017 by Oswaldo Conroy, CNM No   Rh negative state in antepartum period 01/27/2017 by Farrel Conners, CNM No   Overview Signed 06/18/2017 12:04 PM by Vena Austria, MD    Rhogam 06/18/17      Advanced maternal age, primigravida 01/18/2017 by Farrel Conners, CNM No   Overview  Signed 06/18/2017 12:11 PM by Vena Austria, MD    Informaseq normal XY      Obesity in pregnancy 01/18/2017 by Farrel Conners, CNM No   Overview Addendum 07/26/2017 10:22 PM by Natale Milch, MD    BMI >=40 [X]  bASA (>12 weeks) [ ]  Growth u/s 28 [ ] , 32 [ ] , 36 weeks [ ]  [ ]  NST/AFI weekly 36+ weeks (36[] , 37[] , 38[] , 39[] , 40[] ) [ ]  IOL by 41 weeks (scheduled, prn [] )        BMI 40.0-44.9, adult (HCC) 01/18/2017 by Farrel Conners, CNM No   Chronic hypertension affecting pregnancy 01/18/2017 by Farrel Conners, CNM No   Overview Addendum 08/14/2017  4:25 PM by Natale Milch, MD    Monitor blood pressures. CMP and TSH WNL. Baseline PC ratio 98 mgm  [X]  Aspirin 81 mg daily after 12 weeks; discontinue after 36 weeks [X]  baseline labs with CBC, CMP, urine protein/creatinine ratio [ ]  no BP meds unless BPs become elevated [ ]  ultrasound for growth at [X]  28 ordered, [x]  32, 36 [ ]  weeks   Current antihypertensives:  None   Baseline and surveillance labs (pulled in from [facility], refresh links as needed)  Lab Results  Component Value Date   PLT 283 01/23/2017   CREATININE 0.65 01/23/2017   AST 11 01/23/2017   ALT 7 01/23/2017  Antenatal Testing CHTN - O10.919  Group I  BP < 140/90, no preeclampsia, AGA,  nml AFV, +/- meds    Group II BP > 140/90, on meds, no preeclampsia, AGA, nml AFV  20-28-34-38  20-24-28-32-35-38  32//2 x wk  28//BPP wkly then 32//2 x wk  40 no meds; 39 meds  PRN or 37             Review of Systems: 10 point review of systems negative unless otherwise noted in HPI  Past Medical History: Past Medical History:  Diagnosis Date  . Anxiety   . 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 Dysmenorrhea   . Habitual aborter, currently pregnant   . Hypertension    labetalol discontinued in December 2017  . Missed abortion 02/2016, 06/2016  . Obesity     Past Surgical History: Past Surgical History:    Procedure Laterality Date  . BREAST ENHANCEMENT SURGERY Bilateral   . COLPOSCOPY  01/01/2012   Benign  . DILATION AND EVACUATION N/A 02/15/2016   Procedure: DILATATION AND EVACUATION;  Surgeon: Vena AustriaAndreas Kashten Gowin, MD;  Location: [facility] ORS;  Service: Gynecology;  Laterality: N/A;  . DILATION AND EVACUATION N/A 06/10/2016   Procedure: DILATATION AND EVACUATION;  Surgeon: Vena AustriaAndreas Advay Volante, MD;  Location: [facility] ORS;  Service: Gynecology;  Laterality: N/A;  . TONSILLECTOMY  age 36    Past Obstetric History:  Past Gynecologic History:  Family History: Family History  Problem Relation Age of Onset  . Diabetes Mother   . Multiple sclerosis Mother   . Hypertension Mother   . Lung cancer Maternal Grandmother 50  . Lung cancer Father        died at age 36  . Hypertension Father   . Diabetes Father     Social History: Social History   Socioeconomic History  . Marital status: Married    Spouse name: Not on file  . Number of children: 0  . Years of education: Not on file  . Highest education level: Not on file  Social Needs  . Financial resource strain: Not on file  . Food insecurity - worry: Not on file  . Food insecurity - inability: Not on file  . Transportation needs - medical: Not on file  . Transportation needs - non-medical: Not on file  Occupational History  . Not on file  Tobacco Use  . Smoking status: Former Smoker    Packs/day: 0.50    Years: 15.00    Pack years: 7.50    Types: Cigarettes    Last attempt to quit: 12/30/2016    Years since quitting: 0.6  . Smokeless tobacco: Former Engineer, waterUser  Substance and Sexual Activity  . Alcohol use: No  . Drug use: No  . Sexual activity: Yes    Birth control/protection: None  Other Topics Concern  . Not on file  Social History Narrative   Greig Castillandrew is FOB, and involved with care    Medications: Prior to Admission medications   Medication Sig Start Date End Date Taking? Authorizing Provider  aspirin 81 MG chewable tablet Chew 81  mg by mouth daily.    [provider]  escitalopram (LEXAPRO) 10 MG tablet Take 1 tablet (10 mg total) by mouth daily. 06/22/17 06/22/18  Vena AustriaStaebler, Nazaret Chea, MD  ferrous sulfate 325 (65 FE) MG tablet Take 325 mg by mouth daily with breakfast.    [provider]  labetalol (NORMODYNE) 200 MG tablet Take 1 tablet (200 mg total) by mouth 2 (  two) times daily. 08/03/17   Farrel ConnersGutierrez, Colleen, CNM  Prenatal Vit-Fe Fumarate-FA (MULTIVITAMIN-PRENATAL) 27-0.8 MG TABS tablet Take 1 tablet by mouth daily at 12 noon.    [provider]    Allergies: No Known Allergies  Physical Exam: Vitals: Blood pressure (!) 142/88, weight 286 lb (129.7 kg), last menstrual period 11/28/2016.  Urine Dip Protein: negative  FHT: 150  General: NAD HEENT: normocephalic, anicteric Pulmonary: No increased work of breathing Cardiovascular: RRR, distal pulses 2+ Abdomen: Gravid, non-tender Leopolds: vtx Genitourinary: deferred Extremities: no edema, erythema, or tenderness Neurologic: Grossly intact Psychiatric: mood appropriate, affect full  Baseline: 150 Variability: moderate Accelerations: present Decelerations: absent Tocometry: N/A The patient was monitored for 30 minutes, fetal heart rate tracing was deemed reactive, category I tracing,     Labs: No results found for this or any previous visit (from the past 24 hour(s)).  Assessment: 36 y.o. G4P0030 3734w0d by 09/08/2017, by Ultrasound presenting for ROB and IOL scheduling  Plan: 1) Proceed with IOL at 38 weeks for worsening CHTN   2) Fetus - reactive NST  3) PNL - Blood type --/--/O NEG (11/26 1810) / Anti-bodyscreen POS (11/26 1810) / Rubella 11.30 (05/18 1528) / Varicella Immune / RPR Non Reactive (11/26 1810) / HBsAg Negative (05/18 1528) / HIV negative / 1-hr OGTT 135  / GBS Negative (12/04 1313)  4) Immunization History -  Immunization History  Administered Date(s) Administered  . Influenza,inj,Quad PF,6+ Mos  06/18/2017  . Tdap 07/02/2017    5) Disposition - NST/AFI in 3 days then IOL 08/25/17

## 2017-08-18 NOTE — H&P (Deleted)
  The note originally documented on this encounter has been moved the the encounter in which it belongs.  

## 2017-08-18 NOTE — Progress Notes (Signed)
No vb. No lof. NST today °

## 2017-08-18 NOTE — Progress Notes (Signed)
  [facility] Tiffany Chapman 07/29/1981 Medical record #: [id number] Phone #:   Home Phone [phone]  Mobile [phone]    Prenatal Provider:"[facility] Delivering Group:[facility] Proposed admission date/time:08/25/17 Method of induction:Cytotec  Weight: Filed Weights12/11/18 1402Weight:286 lb (129.7 kg) BMI Body mass index is 44.79 kg/m.,". HIV Negative HSV Negative EDC Estimated Date of Delivery: 1/1/19based on:LMP  Gestational age on admission: 3955w0d Gravidity/parity:G4P0030  Cervix Score   0 1 2 3   Position Posterior Midposition Anterior   Consistency Firm Medium Soft   Effacement (%) 0-30 40-50 60-70 >80  Dilation (cm) Closed 1-2 3-4 >5  Baby's station -3 -2 -1 +1, +2   Bishop Score:4   Medical induction of labor  select indication(s) below Elective induction ?39 weeks multiparous patient ?39 weeks primiparous patient with Bishop score ?7 ?40 weeks primiparous patient   Medical Indications Adapted from ACOG Committee Opinion #[id number], "Medically Indicated Late Preterm and Early Term Deliveries," 2013.  PLACENTAL / UTERINE ISSUES FETAL ISSUES MATERNAL ISSUES  ? 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 Isoimmunization (37.0-38.6) ? Preeclampsia without severe features or gestational HTN (37.0)  ? Suspected accreta (34.0-35.6) ? Growth Restriction [name]([place]) ? Preeclampsia with severe features (34.0)  ? Prior classical CD, uterine window, rupture (36.0-37.6) ? Isolated (38.0-39.6) X    Chronic HTN (38.0-39.6)  ? Prior myomectomy (37.0-38.6) ? Concurrent findings (34.0-37.6) ? Cholestasis (37.0)  ? Umbilical vein varix (37.0) ? Growth Restriction (Twins) ? Diabetes  ? Placental abruption (chronic) ? Di-Di Isolated (36.0-37.6) ? Pregestational, controlled (39.0)  OBSTETRIC ISSUES ? Di-Di concurrent findings (32.0-34.6) ? Pregestational, uncontrolled (37.0-39.0)  ? Postdates ? (41 weeks) ? Mo-Di isolated (32.0-34.6) ? Pregestational,  vascular compromise (37.0- 39.0)  ? PPROM (34.0) ? Multiple Gestation ? Gestational, diet controlled (40.0)  ? Hx of IUFD (39.0 weeks) ? Di-Di (38.0-38.6) ? Gestational, med controlled (39.0)  ? Polyhydramnios, mild/moderate; SDV 8-16 or AFI 25-35 (39.0) ? Mo-Di (36.0-37.6) ? Gestational, uncontrolled (38.0-39.0)  ? Oligohydramnios (36.0-37.6); MVP <2 cm  For indications not listed above, delivery recommendations from maternal-fetal medicine consultant occurred on: Date: N/A with Dr. N/A  Provider Signature: Eun Vermeer Scheduled by: AMS Date:08/18/2017 2:35 PM   Call [phone] to finalize the induction date/time  [id number] (07/17)

## 2017-08-21 ENCOUNTER — Ambulatory Visit (INDEPENDENT_AMBULATORY_CARE_PROVIDER_SITE_OTHER): Payer: 59 | Admitting: Obstetrics and Gynecology

## 2017-08-21 ENCOUNTER — Encounter: Payer: Self-pay | Admitting: Obstetrics and Gynecology

## 2017-08-21 ENCOUNTER — Ambulatory Visit (INDEPENDENT_AMBULATORY_CARE_PROVIDER_SITE_OTHER): Payer: 59

## 2017-08-21 VITALS — BP 144/90 | Wt 286.0 lb

## 2017-08-21 DIAGNOSIS — O3663X0 Maternal care for excessive fetal growth, third trimester, not applicable or unspecified: Secondary | ICD-10-CM

## 2017-08-21 DIAGNOSIS — O0990 Supervision of high risk pregnancy, unspecified, unspecified trimester: Secondary | ICD-10-CM | POA: Diagnosis not present

## 2017-08-21 DIAGNOSIS — O09513 Supervision of elderly primigravida, third trimester: Secondary | ICD-10-CM | POA: Diagnosis not present

## 2017-08-21 DIAGNOSIS — Z3A37 37 weeks gestation of pregnancy: Secondary | ICD-10-CM | POA: Diagnosis not present

## 2017-08-21 DIAGNOSIS — O10919 Unspecified pre-existing hypertension complicating pregnancy, unspecified trimester: Secondary | ICD-10-CM | POA: Diagnosis not present

## 2017-08-21 DIAGNOSIS — O09899 Supervision of other high risk pregnancies, unspecified trimester: Secondary | ICD-10-CM

## 2017-08-21 DIAGNOSIS — O403XX0 Polyhydramnios, third trimester, not applicable or unspecified: Secondary | ICD-10-CM

## 2017-08-21 DIAGNOSIS — O163 Unspecified maternal hypertension, third trimester: Secondary | ICD-10-CM | POA: Diagnosis not present

## 2017-08-21 DIAGNOSIS — O2621 Pregnancy care for patient with recurrent pregnancy loss, first trimester: Secondary | ICD-10-CM

## 2017-08-21 DIAGNOSIS — Z6791 Unspecified blood type, Rh negative: Secondary | ICD-10-CM

## 2017-08-21 DIAGNOSIS — Z6841 Body Mass Index (BMI) 40.0 and over, adult: Secondary | ICD-10-CM

## 2017-08-21 DIAGNOSIS — O26899 Other specified pregnancy related conditions, unspecified trimester: Secondary | ICD-10-CM

## 2017-08-21 DIAGNOSIS — O99210 Obesity complicating pregnancy, unspecified trimester: Secondary | ICD-10-CM | POA: Diagnosis not present

## 2017-08-21 LAB — FETAL NONSTRESS TEST

## 2017-08-21 NOTE — Progress Notes (Signed)
Routine Prenatal Care Visit  Subjective  Tiffany Chapman is a 36 y.o. G4P0030 at 328w3d being seen today for ongoing prenatal care.  She is currently monitored for the following issues for this high-risk pregnancy and has Supervision of high risk pregnancy, antepartum; Advanced maternal age, primigravida; Habitual abortion history, antepartum, first trimester; Obesity in pregnancy; BMI 40.0-44.9, adult (HCC); Chronic hypertension affecting pregnancy; Anxiety and depression; Rh negative state in antepartum period; Polyhydramnios affecting pregnancy in third trimester; Large for dates complicating pregnancy in third trimester, antepartum, not applicable or unspecified fetus; Elevated blood pressure affecting pregnancy in third trimester, antepartum; and Habitual aborter, currently pregnant on their problem list.  ----------------------------------------------------------------------------------- Patient reports no complaints.  Denies HA, visual changes, and RUQ pain.  Contractions: Not present. Vag. Bleeding: None.  Movement: Present. Denies leaking of fluid.  U/s today for Growth 4,366 grams, AFI 28 cm, AC>90th %ile. Discussed growth of fetus with overall EFW > 90th %ile. ----------------------------------------------------------------------------------- The following portions of the patient's history were reviewed and updated as appropriate: allergies, current medications, past family history, past medical history, past social history, past surgical history and problem list. Problem list updated.  Objective  Blood pressure (!) 144/90, weight 286 lb (129.7 kg), last menstrual period 11/28/2016. Pregravid weight 251 lb (113.9 kg) Total Weight Gain 35 lb (15.9 kg) Urinalysis:      Fetal Status: Fetal Heart Rate (bpm): 150   Movement: Present  Presentation: Vertex  General:  Alert, oriented and cooperative. Patient is in no acute distress.  Skin: Skin is warm and dry. No rash noted.    Cardiovascular: Normal heart rate noted  Respiratory: Normal respiratory effort, no problems with respiration noted  Abdomen: Soft, gravid, appropriate for gestational age. Pain/Pressure: Absent     Pelvic:  Cervical exam deferred        Extremities: Normal range of motion.     Mental Status: Normal mood and affect. Normal behavior. Normal judgment and thought content.   Assessment   36 y.o. G4P0030 at 488w3d by  09/08/2017, by Ultrasound presenting for routine prenatal visit  Plan   4th pregnancy Problems (from 01/14/17 to present)    Problem Noted Resolved   Polyhydramnios affecting pregnancy in third trimester 07/02/2017 by Oswaldo ConroySchmid, Jacelyn Y, CNM No   Rh negative state in antepartum period 01/27/2017 by Farrel ConnersGutierrez, Colleen, CNM No   Overview Signed 06/18/2017 12:04 PM by Vena AustriaStaebler, Andreas, MD    Rhogam 06/18/17      Advanced maternal age, primigravida 01/18/2017 by Farrel ConnersGutierrez, Colleen, CNM No   Overview Signed 06/18/2017 12:11 PM by Vena AustriaStaebler, Andreas, MD    Informaseq normal XY      Obesity in pregnancy 01/18/2017 by Farrel ConnersGutierrez, Colleen, CNM No   Overview Addendum 07/26/2017 10:22 PM by Natale MilchSchuman, Christanna R, MD    BMI >=40 [X]  bASA (>12 weeks) [ ]  Growth u/s 28 [ ] , 32 [ ] , 36 weeks [ ]  [ ]  NST/AFI weekly 36+ weeks (36[] , 37[] , 38[] , 39[] , 40[] ) [ ]  IOL by 41 weeks (scheduled, prn [] )        BMI 40.0-44.9, adult (HCC) 01/18/2017 by Farrel ConnersGutierrez, Colleen, CNM No   Chronic hypertension affecting pregnancy 01/18/2017 by Farrel ConnersGutierrez, Colleen, CNM No   Overview Addendum 08/14/2017  4:25 PM by Natale MilchSchuman, Christanna R, MD    Monitor blood pressures. CMP and TSH WNL. Baseline PC ratio 98 mgm  [X]  Aspirin 81 mg daily after 12 weeks; discontinue after 36 weeks [X]  baseline labs with CBC, CMP, urine protein/creatinine ratio [ ]   no BP meds unless BPs become elevated [ ]  ultrasound for growth at [X]  28 ordered, [x]  32, 36 [ ]  weeks   Current antihypertensives:  None   Baseline and  surveillance labs ([facility], refresh links as needed)  Lab Results  Component Value Date   PLT 283 01/23/2017   CREATININE 0.65 01/23/2017   AST 11 01/23/2017   ALT 7 01/23/2017    Antenatal Testing CHTN - O10.919  Group I  BP < 140/90, no preeclampsia, AGA,  nml AFV, +/- meds    Group II BP > 140/90, on meds, no preeclampsia, AGA, nml AFV  20-28-34-38  20-24-28-32-35-38  32//2 x wk  28//BPP wkly then 32//2 x wk  40 no meds; 39 meds  PRN or 37         Supervision of high risk pregnancy, antepartum 01/14/2017 by Trellis Momentroy, Hollie, CMA No   Overview Addendum 08/15/2017 10:52 AM by Vena AustriaStaebler, Andreas, MD     [facility] Prenatal Labs  Dating LMP = 6 week US Blood type: O/Positive/-- (05/09 1538)   Genetic Screen NIPS: XY normal Antibody:Negative (05/09 1538)  Anatomic US Complete Rubella: <0.90 (05/09 1538)  GTT Early: 121           Third trimester: 135 RPR: Non Reactive (05/09 1538)   Flu vaccine 06/18/17 HBsAg: Negative (05/09 1538)   TDaP vaccine 07/02/17  Rhogam: 06/18/17 HIV: Non Reactive (05/09 1538)   Baby Food  Bottle                 GBS: negative  Contraception POPs Pap: 01/14/17 NIL HPV negative  Circumcision  Varicella: Non immune  Pediatrician    Support Person      HIgh risk for obesity with BMI=40, CHTN, habitual abortions, AMA, and anxiety/depression Slight anemia on 28 wk labs--Lm to add Fe supp           Term labor symptoms and general obstetric precautions including but not limited to vaginal bleeding, contractions, leaking of fluid and fetal movement were reviewed in detail with the patient. Please refer to After Visit Summary for other counseling recommendations.   Scheduled for IOL on 12/18 already.  Keep this appt unless BP worsens, intractable headache, blurry vision, ruq pain.   Discussed size of baby in relation to delivery and shoulder dystocia.  Possibility of undiagnosed GDM.  28 week 1h gtt = 135.  Will monitor labor curve  closely.  Thomasene MohairStephen Brice Kossman, MD  08/21/2017 6:25 PM

## 2017-08-24 ENCOUNTER — Encounter (INDEPENDENT_AMBULATORY_CARE_PROVIDER_SITE_OTHER): Payer: Self-pay

## 2017-08-24 ENCOUNTER — Telehealth: Payer: Self-pay | Admitting: Obstetrics and Gynecology

## 2017-08-24 ENCOUNTER — Telehealth: Payer: Self-pay

## 2017-08-24 NOTE — Telephone Encounter (Signed)
Toniann FailWendy from [employer] calling for reason why pt has been out of work since 11/27 and if we supported.  Spoke c a female coworker at [employer], gave chronic hypertension and polyhydramnios as reasons and 'yes' we do support 11/27 as start of STD and ends six weeks after delivery.

## 2017-08-25 ENCOUNTER — Encounter: Payer: Self-pay | Admitting: *Deleted

## 2017-08-25 ENCOUNTER — Inpatient Hospital Stay: Payer: 59 | Admitting: Certified Registered Nurse Anesthetist

## 2017-08-25 ENCOUNTER — Inpatient Hospital Stay
Admission: EM | Admit: 2017-08-25 | Discharge: 2017-08-28 | DRG: 787 | Disposition: A | Discharge status: Home / Self Care | Payer: 59 | Attending: Obstetrics & Gynecology | Admitting: Obstetrics & Gynecology

## 2017-08-25 ENCOUNTER — Other Ambulatory Visit: Payer: Self-pay

## 2017-08-25 ENCOUNTER — Encounter
Admission: EM | Disposition: A | Discharge status: Home / Self Care | Payer: Self-pay | Source: Home / Self Care | Attending: Obstetrics & Gynecology

## 2017-08-25 DIAGNOSIS — O321XX0 Maternal care for breech presentation, not applicable or unspecified: Secondary | ICD-10-CM | POA: Diagnosis present

## 2017-08-25 DIAGNOSIS — O9081 Anemia of the puerperium: Secondary | ICD-10-CM | POA: Diagnosis not present

## 2017-08-25 DIAGNOSIS — Z6791 Unspecified blood type, Rh negative: Secondary | ICD-10-CM

## 2017-08-25 DIAGNOSIS — O26899 Other specified pregnancy related conditions, unspecified trimester: Secondary | ICD-10-CM

## 2017-08-25 DIAGNOSIS — Z6841 Body Mass Index (BMI) 40.0 and over, adult: Secondary | ICD-10-CM

## 2017-08-25 DIAGNOSIS — D62 Acute posthemorrhagic anemia: Secondary | ICD-10-CM | POA: Diagnosis not present

## 2017-08-25 DIAGNOSIS — O1002 Pre-existing essential hypertension complicating childbirth: Principal | ICD-10-CM | POA: Diagnosis present

## 2017-08-25 DIAGNOSIS — O99210 Obesity complicating pregnancy, unspecified trimester: Secondary | ICD-10-CM

## 2017-08-25 DIAGNOSIS — O09519 Supervision of elderly primigravida, unspecified trimester: Secondary | ICD-10-CM

## 2017-08-25 DIAGNOSIS — Z3A38 38 weeks gestation of pregnancy: Secondary | ICD-10-CM

## 2017-08-25 DIAGNOSIS — O403XX0 Polyhydramnios, third trimester, not applicable or unspecified: Secondary | ICD-10-CM | POA: Diagnosis present

## 2017-08-25 DIAGNOSIS — O09513 Supervision of elderly primigravida, third trimester: Secondary | ICD-10-CM

## 2017-08-25 DIAGNOSIS — O3663X0 Maternal care for excessive fetal growth, third trimester, not applicable or unspecified: Secondary | ICD-10-CM | POA: Diagnosis present

## 2017-08-25 DIAGNOSIS — O99214 Obesity complicating childbirth: Secondary | ICD-10-CM | POA: Diagnosis present

## 2017-08-25 DIAGNOSIS — O0990 Supervision of high risk pregnancy, unspecified, unspecified trimester: Secondary | ICD-10-CM

## 2017-08-25 DIAGNOSIS — Z87891 Personal history of nicotine dependence: Secondary | ICD-10-CM

## 2017-08-25 DIAGNOSIS — O139 Gestational [pregnancy-induced] hypertension without significant proteinuria, unspecified trimester: Secondary | ICD-10-CM

## 2017-08-25 DIAGNOSIS — O10919 Unspecified pre-existing hypertension complicating pregnancy, unspecified trimester: Secondary | ICD-10-CM | POA: Diagnosis present

## 2017-08-25 LAB — CBC
HCT: 33.1 % — ABNORMAL LOW (ref 35.0–47.0)
Hemoglobin: 11.3 g/dL — ABNORMAL LOW (ref 12.0–16.0)
MCH: 32.8 pg (ref 26.0–34.0)
MCHC: 34.2 g/dL (ref 32.0–36.0)
MCV: 96 fL (ref 80.0–100.0)
PLATELETS: 198 10*3/uL (ref 150–440)
RBC: 3.45 MIL/uL — AB (ref 3.80–5.20)
RDW: 13.8 % (ref 11.5–14.5)
WBC: 9.1 10*3/uL (ref 3.6–11.0)

## 2017-08-25 LAB — COMPREHENSIVE METABOLIC PANEL
ALBUMIN: 3 g/dL — AB (ref 3.5–5.0)
ALK PHOS: 214 U/L — AB (ref 38–126)
ALT: 13 U/L — ABNORMAL LOW (ref 14–54)
ANION GAP: 10 (ref 5–15)
AST: 23 U/L (ref 15–41)
BILIRUBIN TOTAL: 0.6 mg/dL (ref 0.3–1.2)
BUN: 7 mg/dL (ref 6–20)
CALCIUM: 9.1 mg/dL (ref 8.9–10.3)
CO2: 20 mmol/L — ABNORMAL LOW (ref 22–32)
Chloride: 105 mmol/L (ref 101–111)
Creatinine, Ser: 0.47 mg/dL (ref 0.44–1.00)
GFR calc Af Amer: >60 mL/min (ref >60)
GFR calc non Af Amer: >60 mL/min (ref >60)
GLUCOSE: 76 mg/dL (ref 65–99)
Potassium: 3.9 mmol/L (ref 3.5–5.1)
Sodium: 135 mmol/L (ref 135–145)
TOTAL PROTEIN: 6.3 g/dL — AB (ref 6.5–8.1)

## 2017-08-25 LAB — PROTEIN / CREATININE RATIO, URINE
CREATININE, URINE: 60 mg/dL
Protein Creatinine Ratio: 0.23 mg/mg{Cre} — ABNORMAL HIGH (ref 0.00–0.15)
Total Protein, Urine: 14 mg/dL

## 2017-08-25 SURGERY — Surgical Case
Anesthesia: Spinal

## 2017-08-25 MED ORDER — PRENATAL MULTIVITAMIN CH
1.0000 | ORAL_TABLET | Freq: Every day | ORAL | Status: DC
  Administered 2017-08-26 – 2017-08-28 (×3): 1 via ORAL
  Filled 2017-08-25 (×3): qty 1

## 2017-08-25 MED ORDER — WITCH HAZEL-GLYCERIN EX PADS: 1.0000 "application " | MEDICATED_PAD | CUTANEOUS | Status: DC | PRN

## 2017-08-25 MED ORDER — SIMETHICONE 80 MG PO CHEW
80.0000 mg | CHEWABLE_TABLET | ORAL | Status: DC
  Administered 2017-08-25 – 2017-08-26 (×2): 80 mg via ORAL
  Filled 2017-08-25 (×2): qty 1

## 2017-08-25 MED ORDER — LABETALOL HCL 200 MG PO TABS
200.0000 mg | ORAL_TABLET | Freq: Two times a day (BID) | ORAL | Status: DC
  Administered 2017-08-25: 200 mg via ORAL
  Filled 2017-08-25: qty 2

## 2017-08-25 MED ORDER — KETOROLAC TROMETHAMINE 30 MG/ML IJ SOLN: 30.0000 mg | Freq: Four times a day (QID) | INTRAMUSCULAR | Status: AC | PRN

## 2017-08-25 MED ORDER — SIMETHICONE 80 MG PO CHEW
80.0000 mg | CHEWABLE_TABLET | Freq: Three times a day (TID) | ORAL | Status: DC
  Administered 2017-08-26 – 2017-08-28 (×8): 80 mg via ORAL
  Filled 2017-08-25 (×8): qty 1

## 2017-08-25 MED ORDER — OXYTOCIN 40 UNITS IN LACTATED RINGERS INFUSION - SIMPLE MED
2.5000 [IU]/h | INTRAVENOUS | Status: DC
  Administered 2017-08-25: 1 mL via INTRAVENOUS
  Administered 2017-08-25: 399 mL via INTRAVENOUS
  Filled 2017-08-25: qty 1000

## 2017-08-25 MED ORDER — NALBUPHINE HCL 10 MG/ML IJ SOLN: 5.0000 mg | INTRAMUSCULAR | Status: DC | PRN

## 2017-08-25 MED ORDER — NALOXONE HCL 0.4 MG/ML IJ SOLN: 0.4000 mg | INTRAMUSCULAR | Status: DC | PRN

## 2017-08-25 MED ORDER — DIPHENHYDRAMINE HCL 25 MG PO CAPS: 25.0000 mg | ORAL_CAPSULE | Freq: Four times a day (QID) | ORAL | Status: DC | PRN

## 2017-08-25 MED ORDER — LACTATED RINGERS IV SOLN
INTRAVENOUS | Status: DC
  Administered 2017-08-25 (×2): via INTRAVENOUS

## 2017-08-25 MED ORDER — OXYCODONE-ACETAMINOPHEN 5-325 MG PO TABS: 1.0000 | ORAL_TABLET | ORAL | Status: DC | PRN

## 2017-08-25 MED ORDER — OXYCODONE HCL 5 MG PO TABS: 5.0000 mg | ORAL_TABLET | ORAL | Status: AC | PRN

## 2017-08-25 MED ORDER — BUPIVACAINE ON-Q PAIN PUMP (FOR ORDER SET NO CHG)
INJECTION | Status: DC
  Filled 2017-08-25: qty 1

## 2017-08-25 MED ORDER — SODIUM CHLORIDE 0.9% FLUSH: 3.0000 mL | INTRAVENOUS | Status: DC | PRN

## 2017-08-25 MED ORDER — MISOPROSTOL 25 MCG QUARTER TABLET: 25.0000 ug | ORAL_TABLET | ORAL | Status: DC | PRN

## 2017-08-25 MED ORDER — BUPIVACAINE HCL (PF) 0.5 % IJ SOLN
INTRAMUSCULAR | Status: AC
  Filled 2017-08-25: qty 30

## 2017-08-25 MED ORDER — LACTATED RINGERS IV SOLN: INTRAVENOUS | Status: DC

## 2017-08-25 MED ORDER — FENTANYL CITRATE (PF) 100 MCG/2ML IJ SOLN
INTRAMUSCULAR | Status: AC
  Filled 2017-08-25: qty 2

## 2017-08-25 MED ORDER — BUPIVACAINE HCL (PF) 0.5 % IJ SOLN
INTRAMUSCULAR | Status: DC | PRN
  Administered 2017-08-25: 10 mL

## 2017-08-25 MED ORDER — FENTANYL CITRATE (PF) 100 MCG/2ML IJ SOLN: 25.0000 ug | INTRAMUSCULAR | Status: DC | PRN

## 2017-08-25 MED ORDER — SOD CITRATE-CITRIC ACID 500-334 MG/5ML PO SOLN
30.0000 mL | ORAL | Status: AC
  Administered 2017-08-25: 30 mL via ORAL

## 2017-08-25 MED ORDER — SIMETHICONE 80 MG PO CHEW: 80.0000 mg | CHEWABLE_TABLET | ORAL | Status: DC | PRN

## 2017-08-25 MED ORDER — SOD CITRATE-CITRIC ACID 500-334 MG/5ML PO SOLN
30.0000 mL | ORAL | Status: DC | PRN
  Filled 2017-08-25: qty 15

## 2017-08-25 MED ORDER — BUPIVACAINE 0.25 % ON-Q PUMP DUAL CATH 400 ML
400.0000 mL | INJECTION | Status: DC
  Filled 2017-08-25: qty 400

## 2017-08-25 MED ORDER — LACTATED RINGERS IV SOLN: 500.0000 mL | INTRAVENOUS | Status: DC | PRN

## 2017-08-25 MED ORDER — MEPERIDINE HCL 25 MG/ML IJ SOLN: 6.2500 mg | INTRAMUSCULAR | Status: DC | PRN

## 2017-08-25 MED ORDER — BUPIVACAINE IN DEXTROSE 0.75-8.25 % IT SOLN
INTRATHECAL | Status: DC | PRN
  Administered 2017-08-25: 1.8 mL via INTRATHECAL

## 2017-08-25 MED ORDER — ACETAMINOPHEN 325 MG PO TABS: 650.0000 mg | ORAL_TABLET | ORAL | Status: DC | PRN

## 2017-08-25 MED ORDER — MENTHOL 3 MG MT LOZG
1.0000 | LOZENGE | OROMUCOSAL | Status: DC | PRN
  Filled 2017-08-25: qty 9

## 2017-08-25 MED ORDER — ACETAMINOPHEN 500 MG PO TABS
1000.0000 mg | ORAL_TABLET | Freq: Three times a day (TID) | ORAL | Status: AC
Start: 2017-08-25 — End: 2017-08-26
  Administered 2017-08-25 – 2017-08-26 (×3): 1000 mg via ORAL
  Filled 2017-08-25 (×3): qty 2

## 2017-08-25 MED ORDER — DIPHENHYDRAMINE HCL 25 MG PO CAPS: 25.0000 mg | ORAL_CAPSULE | ORAL | Status: DC | PRN

## 2017-08-25 MED ORDER — BUPIVACAINE HCL (PF) 0.5 % IJ SOLN
20.0000 mL | INTRAMUSCULAR | Status: DC
  Filled 2017-08-25: qty 20

## 2017-08-25 MED ORDER — MORPHINE SULFATE (PF) 0.5 MG/ML IJ SOLN
INTRAMUSCULAR | Status: AC
  Filled 2017-08-25: qty 10

## 2017-08-25 MED ORDER — OXYTOCIN 40 UNITS IN LACTATED RINGERS INFUSION - SIMPLE MED
2.5000 [IU]/h | INTRAVENOUS | Status: AC
  Filled 2017-08-25 (×2): qty 1000

## 2017-08-25 MED ORDER — COCONUT OIL OIL: 1.0000 "application " | TOPICAL_OIL | Status: DC | PRN

## 2017-08-25 MED ORDER — NALBUPHINE HCL 10 MG/ML IJ SOLN: 5.0000 mg | Freq: Once | INTRAMUSCULAR | Status: DC | PRN

## 2017-08-25 MED ORDER — LIDOCAINE HCL (PF) 1 % IJ SOLN: 30.0000 mL | INTRAMUSCULAR | Status: DC | PRN

## 2017-08-25 MED ORDER — BUPIVACAINE IN DEXTROSE 0.75-8.25 % IT SOLN
INTRATHECAL | Status: AC
  Filled 2017-08-25: qty 2

## 2017-08-25 MED ORDER — DIBUCAINE 1 % RE OINT: 1.0000 "application " | TOPICAL_OINTMENT | RECTAL | Status: DC | PRN

## 2017-08-25 MED ORDER — ONDANSETRON HCL 4 MG/2ML IJ SOLN: 4.0000 mg | Freq: Three times a day (TID) | INTRAMUSCULAR | Status: DC | PRN

## 2017-08-25 MED ORDER — OXYCODONE HCL 5 MG PO TABS: 5.0000 mg | ORAL_TABLET | Freq: Once | ORAL | Status: DC | PRN

## 2017-08-25 MED ORDER — MORPHINE SULFATE (PF) 0.5 MG/ML IJ SOLN
INTRAMUSCULAR | Status: DC | PRN
  Administered 2017-08-25: .1 mg via INTRATHECAL

## 2017-08-25 MED ORDER — ESCITALOPRAM OXALATE 10 MG PO TABS
10.0000 mg | ORAL_TABLET | Freq: Every day | ORAL | Status: DC
  Administered 2017-08-25 – 2017-08-28 (×4): 10 mg via ORAL
  Filled 2017-08-25 (×3): qty 1

## 2017-08-25 MED ORDER — SENNOSIDES-DOCUSATE SODIUM 8.6-50 MG PO TABS
2.0000 | ORAL_TABLET | ORAL | Status: DC
  Administered 2017-08-26 – 2017-08-28 (×3): 2 via ORAL
  Filled 2017-08-25 (×3): qty 2

## 2017-08-25 MED ORDER — CEFAZOLIN SODIUM-DEXTROSE 2-4 GM/100ML-% IV SOLN: 2.0000 g | INTRAVENOUS | Status: DC

## 2017-08-25 MED ORDER — IBUPROFEN 600 MG PO TABS
600.0000 mg | ORAL_TABLET | Freq: Four times a day (QID) | ORAL | Status: DC
  Administered 2017-08-25 – 2017-08-28 (×11): 600 mg via ORAL
  Filled 2017-08-25 (×11): qty 1

## 2017-08-25 MED ORDER — OXYCODONE-ACETAMINOPHEN 5-325 MG PO TABS: 2.0000 | ORAL_TABLET | ORAL | Status: DC | PRN

## 2017-08-25 MED ORDER — TERBUTALINE SULFATE 1 MG/ML IJ SOLN: 0.2500 mg | Freq: Once | INTRAMUSCULAR | Status: DC | PRN

## 2017-08-25 MED ORDER — ONDANSETRON HCL 4 MG/2ML IJ SOLN: 4.0000 mg | Freq: Four times a day (QID) | INTRAMUSCULAR | Status: DC | PRN

## 2017-08-25 MED ORDER — OXYCODONE HCL 5 MG/5ML PO SOLN: 5.0000 mg | Freq: Once | ORAL | Status: DC | PRN

## 2017-08-25 MED ORDER — OXYTOCIN BOLUS FROM INFUSION: 500.0000 mL | Freq: Once | INTRAVENOUS | Status: DC

## 2017-08-25 MED ORDER — ACETAMINOPHEN 325 MG PO TABS
650.0000 mg | ORAL_TABLET | ORAL | Status: DC | PRN
  Administered 2017-08-26: 650 mg via ORAL
  Filled 2017-08-25: qty 2

## 2017-08-25 MED ORDER — FENTANYL CITRATE (PF) 100 MCG/2ML IJ SOLN
INTRAMUSCULAR | Status: DC | PRN
  Administered 2017-08-25: 15 ug via INTRATHECAL

## 2017-08-25 MED ORDER — DIPHENHYDRAMINE HCL 50 MG/ML IJ SOLN: 12.5000 mg | INTRAMUSCULAR | Status: DC | PRN

## 2017-08-25 MED ORDER — DEXTROSE 5 % IV SOLN
2.0000 g | Freq: Once | INTRAVENOUS | Status: AC
  Administered 2017-08-25: 2 g via INTRAVENOUS
  Filled 2017-08-25: qty 2000

## 2017-08-25 SURGICAL SUPPLY — 31 items
BAG COUNTER SPONGE EZ (MISCELLANEOUS) ×2 IMPLANT
CANISTER SUCT 3000ML PPV (MISCELLANEOUS) ×3 IMPLANT
CATH KIT ON-Q SILVERSOAK 5IN (CATHETERS) IMPLANT
CHLORAPREP W/TINT 26ML (MISCELLANEOUS) ×6 IMPLANT
CLOSURE WOUND 1/2 X4 (GAUZE/BANDAGES/DRESSINGS) ×1
COUNTER SPONGE BAG EZ (MISCELLANEOUS) ×1
DERMABOND ADVANCED (GAUZE/BANDAGES/DRESSINGS) ×2
DERMABOND ADVANCED .7 DNX12 (GAUZE/BANDAGES/DRESSINGS) ×1 IMPLANT
DRSG OPSITE POSTOP 4X10 (GAUZE/BANDAGES/DRESSINGS) ×3 IMPLANT
DRSG TELFA 3X8 NADH (GAUZE/BANDAGES/DRESSINGS) ×3 IMPLANT
ELECT CAUTERY BLADE 6.4 (BLADE) ×3 IMPLANT
ELECT REM PT RETURN 9FT ADLT (ELECTROSURGICAL) ×3
ELECTRODE REM PT RTRN 9FT ADLT (ELECTROSURGICAL) ×1 IMPLANT
GAUZE SPONGE 4X4 12PLY STRL (GAUZE/BANDAGES/DRESSINGS) ×3 IMPLANT
GLOVE BIO SURGEON STRL SZ7 (GLOVE) ×3 IMPLANT
GLOVE INDICATOR 7.5 STRL GRN (GLOVE) ×3 IMPLANT
GOWN STRL REUS W/ TWL LRG LVL3 (GOWN DISPOSABLE) ×3 IMPLANT
GOWN STRL REUS W/TWL LRG LVL3 (GOWN DISPOSABLE) ×6
NS IRRIG 1000ML POUR BTL (IV SOLUTION) ×3 IMPLANT
PACK C SECTION AR (MISCELLANEOUS) ×3 IMPLANT
PAD OB MATERNITY 4.3X12.25 (PERSONAL CARE ITEMS) ×3 IMPLANT
PAD PREP 24X41 OB/GYN DISP (PERSONAL CARE ITEMS) ×3 IMPLANT
RETRACTOR TRAXI PANNICULUS (MISCELLANEOUS) ×1 IMPLANT
STAPLER INSORB 30 2030 C-SECTI (MISCELLANEOUS) ×3 IMPLANT
STRIP CLOSURE SKIN 1/2X4 (GAUZE/BANDAGES/DRESSINGS) ×2 IMPLANT
SUT MNCRL AB 4-0 PS2 18 (SUTURE) ×3 IMPLANT
SUT PDS AB 1 TP1 96 (SUTURE) ×6 IMPLANT
SUT VIC AB 0 CTX 36 (SUTURE) ×4
SUT VIC AB 0 CTX36XBRD ANBCTRL (SUTURE) ×2 IMPLANT
SUT VIC AB 2-0 CT1 36 (SUTURE) ×3 IMPLANT
TRAXI PANNICULUS RETRACTOR (MISCELLANEOUS) ×2

## 2017-08-25 NOTE — H&P (Deleted)
  The note originally documented on this encounter has been moved the the encounter in which it belongs.  

## 2017-08-25 NOTE — Anesthesia Procedure Notes (Signed)
Spinal  Patient location during procedure: OR Start time: 08/25/2017 6:17 PM End time: 08/25/2017 6:18 PM Staffing Anesthesiologist: Piscitello, Precious Haws, MD Performed: anesthesiologist  Preanesthetic Checklist Completed: patient identified, site marked, surgical consent, pre-op evaluation, timeout performed, IV checked, risks and benefits discussed and monitors and equipment checked Spinal Block Patient position: sitting Prep: ChloraPrep Patient monitoring: heart rate, continuous pulse ox, blood pressure and cardiac monitor Approach: midline Location: L3-4 Injection technique: single-shot Needle Needle type: Whitacre and Introducer  Needle gauge: 24 G Needle length: 9 cm Assessment Sensory level: T3 Additional Notes Negative paresthesia. Negative blood return. Positive free-flowing CSF. Expiration date of kit checked and confirmed. Patient tolerated procedure well, without complications.

## 2017-08-25 NOTE — Anesthesia Procedure Notes (Signed)
Spinal

## 2017-08-25 NOTE — H&P (Signed)
Date of Initial H&P: 08/18/2017  History reviewed, patient examined, no change in status, proceed with IOL for worsening CHTN

## 2017-08-25 NOTE — Op Note (Addendum)
Preoperative Diagnosis: 1) 36 y.o. G4P0030 at 5554w0d 2) Unstable fetal lie  3) Obesity 4) Polyhydramnios 5) Macrosomia 6) Chronic hypertension with worsening blood pressures  Postoperative Diagnosis: 1) 36 y.o. [id number] at 3254w0d 2) Unstable fetal lie  3) Obesity 4) Polyhydramnios 5) Macrosomia 6) Chronic hypertension with worsening blood pressures  Operation Performed: Primary low transverse C-section via pfannenstiel skin incision  Indication: Presented for induction secondary to chronic hypertension and worsening blood pressures.  Polyhydramnios noted antenatal, with unstable fetal lie (breech 1.5 weeks ago, vertex last week, transverse back up today at presentation to labor and delivery).  Patient had eaten and fetus after 8-hr post last meal proceeded with cesarean section.  Fetus not to be vertex at the time of delivery  Anesthesia: Spinal  Primary Surgeon: Vena AustriaAndreas Yazan Gatling, MD  Assistant: Annamarie MajorPaul Harris, MD  Preoperative Antibiotics: 2g Ancef  Estimated Blood Loss: 700mL  IV Fluids: 800mL  Urine Output:: 30mL  Drains or Tubes: Foley to gravity drainage, ON-Q catheter system  Implants: none  Specimens Removed: none  Complications: none  Intraoperative Findings:  Normal tubes ovaries and uterus.  Delivery resulted in the birth of a liveborn female, APGAR (1 MIN): 9   APGAR (5 MINS): 9   Weight 9lbs 0oz  Patient Condition: stable  Procedure in Detail:  Patient was taken to the operating room were she was administered regional anesthesia.  She was positioned in the supine position, prepped and draped in the  Usual sterile fashion.  Prior to proceeding with the case a time out was performed and the level of anesthetic was checked and noted to be adequate.  Utilizing the scalpel a pfannenstiel skin incision was made 2cm above the pubic symphysis and carried down sharply to the the level of the rectus fascia.  The fascia was incised in the midline using the scalpel and then  extended using mayo scissors.  The superior border of the rectus fascia was grasped with two Kocher clamps and the underlying rectus muscles were dissected of the fascia using blunt dissection.  The median raphae was incised using Mayo scissors.   The inferior border of the rectus fascia was dissected of the rectus muscles in a similar fashion.  The midline was identified, the peritoneum was entered bluntly and expanded using manual tractions.  The uterus was noted to be in a none rotated position.  Next the bladder blade was placed retracting the bladder caudally.  A bladder flap was not created.  A low transverse incision was scored on the lower uterine segment.  The hysterotomy was entered bluntly using the operators finger.  The hysterotomy incision was extended using manual traction.  The operators hand was placed within the hysterotomy position noting the fetus to be within the vertex position.  The vertex was grasped, flexed, brought to the incision, and delivered a traumatically using fundal pressure.  The remainder of the body delivered with ease.  The infant was suctioned, cord was clamped and cut before handing off to the awaiting neonatologist.  The placenta was delivered using manual extraction.  The uterus was exteriorized, wiped clean of clots and debris using two moist laps.  The hysterotomy was closed using a two layer closure of 0 Vicryl, with the first being a running locked, the second a vertical imbricating.  The uterus was returned to the abdomen.  The peritoneal gutters were wiped clean of clots and debris using two moist laps.  The hysterotomy incision was re-inspected noted to be hemostatic.  The rectus  muscles were inspected noted to be hemostatic.  The superior border of the rectus fascia was grasped with a Kocher clamp.  The ON-Q trocars were then placed 4cm above the superior border of the incision and tunneled subfascially.  The introducers were removed and the catheters were threaded  through the sleeves after which the sleeves were removed.  The fascia was closed using a looped #1 PDS in a running fashion taking 1cm by 1cm bites.  The subcutaneous tissue was irrigated using warm saline, hemostasis achieved using the bovie.  The subcutaneous dead space was greater than 3cm and was closed.  The subcutaneous dead space was obliterated by using a 53-T 0 Chromic in a running fashion.  The skin was closed using Insorb staples.  Sponge needle and instrument counts were corrects times two.  The patient tolerated the procedure well and was taken to the recovery room in stable condition.

## 2017-08-25 NOTE — Anesthesia Preprocedure Evaluation (Signed)
Anesthesia Evaluation  Patient identified by MRN, date of birth, ID band Patient awake    Reviewed: Allergy & Precautions, H&P , NPO status , Patient's Chart, lab work & pertinent test results  History of Anesthesia Complications Negative for: history of anesthetic complications  Airway Mallampati: III  TM Distance: >3 FB Neck ROM: full    Dental  (+) Chipped, Poor Dentition   Pulmonary former smoker,           Cardiovascular Exercise Tolerance: Good hypertension,      Neuro/Psych PSYCHIATRIC DISORDERS Anxiety    GI/Hepatic negative GI ROS,   Endo/Other  Morbid obesity  Renal/GU   negative genitourinary   Musculoskeletal   Abdominal   Peds  Hematology negative hematology ROS (+)   Anesthesia Other Findings Past Medical History: No date: Anxiety 12/22/2011: ASCUS with positive high risk HPV cervical No date: Dysmenorrhea No date: Habitual aborter, currently pregnant No date: Hypertension     Comment:  labetalol discontinued in December 2017 02/2016, 06/2016: Missed abortion No date: Obesity  Past Surgical History: No date: BREAST ENHANCEMENT SURGERY; Bilateral 01/01/2012: COLPOSCOPY     Comment:  Benign 02/15/2016: DILATION AND EVACUATION; N/A     Comment:  Procedure: DILATATION AND EVACUATION;  Surgeon: Vena AustriaAndreas               Staebler, MD;  Location: [facility] ORS;  Service: Gynecology;               Laterality: N/A; 06/10/2016: DILATION AND EVACUATION; N/A     Comment:  Procedure: DILATATION AND EVACUATION;  Surgeon: Vena AustriaAndreas               Staebler, MD;  Location: [facility] ORS;  Service: Gynecology;               Laterality: N/A; age 36: TONSILLECTOMY     Reproductive/Obstetrics (+) Pregnancy                             Anesthesia Physical Anesthesia Plan  ASA: III  Anesthesia Plan: Spinal   Post-op Pain Management:    Induction:   PONV Risk Score and Plan:   Airway  Management Planned: Natural Airway and Nasal Cannula  Additional Equipment:   Intra-op Plan:   Post-operative Plan:   Informed Consent: I have reviewed the patients History and Physical, chart, labs and discussed the procedure including the risks, benefits and alternatives for the proposed anesthesia with the patient or authorized representative who has indicated his/her understanding and acceptance.   Dental Advisory Given  Plan Discussed with: Anesthesiologist, CRNA and Surgeon  Anesthesia Plan Comments: (Patient reports no bleeding problems and no anticoagulant use.  Plan for spinal with backup GA  Patient consented for risks of anesthesia including but not limited to:  - adverse reactions to medications - risk of bleeding, infection, nerve damage and headache - risk of failed spinal - damage to teeth, lips or other oral mucosa - sore throat or hoarseness - Damage to heart, brain, lungs or loss of life  Patient voiced understanding.)        Anesthesia Quick Evaluation

## 2017-08-25 NOTE — Discharge Summary (Signed)
Obstetric Discharge Summary Reason for Admission: induction of labor Prenatal Procedures: none Intrapartum Procedures: cesarean: low cervical, transverse Postpartum Procedures: none Complications-Operative and Postpartum: none Hemoglobin  Date Value Ref Range Status  08/26/2017 9.9 (L) 12.0 - 16.0 g/dL Final  09/81/191410/07/2017 78.210.0 (L) 11.1 - 15.9 g/dL Final   HCT  Date Value Ref Range Status  08/26/2017 29.2 (L) 35.0 - 47.0 % Final   Hematocrit  Date Value Ref Range Status  06/18/2017 30.4 (L) 34.0 - 46.6 % Final    Physical Exam:  General: alert, cooperative, appears stated age and no distress Lochia: appropriate Uterine Fundus: firm Incision: healing well DVT Evaluation: No evidence of DVT seen on physical exam.  Discharge Diagnoses: Term Pregnancy-delivered  Discharge Information: Date: 08/28/2017 Activity: pelvic rest no lifting >10lbs for 6 weeks Diet: routine Allergies as of 08/28/2017   No Known Allergies     Medication List    TAKE these medications   aspirin 81 MG chewable tablet Chew 81 mg by mouth daily.   escitalopram 10 MG tablet Commonly known as:  LEXAPRO Take 1 tablet (10 mg total) by mouth daily.   ferrous sulfate 325 (65 FE) MG tablet Take 325 mg by mouth daily with breakfast.   labetalol 200 MG tablet Commonly known as:  NORMODYNE Take 2 tablets (400 mg total) by mouth 2 (two) times daily. What changed:  how much to take   multivitamin-prenatal 27-0.8 MG Tabs tablet Take 1 tablet by mouth daily at 12 noon.   oxyCODONE-acetaminophen 5-325 MG tablet Commonly known as:  PERCOCET/ROXICET Take 1 tablet by mouth every 4 (four) hours as needed (pain scale 4-7).       Condition: stable Discharge to: home Follow-up Information    Vena AustriaStaebler, Andreas, MD Follow up in 1 week(s).   Specialty:  Obstetrics and Gynecology Why:  For wound re-check, BP check Contact information: [address] [place] [address] [phone]            Newborn Data: Live born female  Birth Weight: 8 lb 15.6 oz (4070 g) APGAR: 9, 9  Newborn Delivery   Birth date/time:  08/25/2017 18:39:00 Delivery type:  C-Section, Low Transverse C-section categorization:  Primary    Home with mother.  Letitia Libraobert Paul Kimber Fritts 08/28/2017, 7:51 AM

## 2017-08-25 NOTE — Anesthesia Post-op Follow-up Note (Signed)
Anesthesia QCDR form completed.        

## 2017-08-25 NOTE — Progress Notes (Signed)
Ultrasound today at bedside reveals transverse back up presentation.  Give last EFW of 9lbs 10oz not great candidate for ECV, lie has been unstable.  Discussed pros and cons.  Patient is agreement to proceed with LTCS.  The patient was counseled regarding risk and benefits to proceeding with Cesarean section to expedite delivery.  Risk of cesarean section were discussed including risk of bleeding and need for potential intraoperative or postoperative blood transfusion with a rate of approximately 5% quoted for all Cesarean sections, risk of injury to adjacent organs including but not limited to bowl and bladder, the need for additional surgical procedures to address such injuries, and the risk of infection.

## 2017-08-25 NOTE — Transfer of Care (Signed)
Immediate Anesthesia Transfer of Care Note  Patient: Tiffany FiddlerAshleigh V Chapman  Procedure(s) Performed: CESAREAN SECTION (N/A )  Patient Location: PACU  Anesthesia Type:Spinal  Level of Consciousness: awake, alert , oriented and patient cooperative  Airway & Oxygen Therapy: Patient Spontanous Breathing  Post-op Assessment: Report given to RN and Post -op Vital signs reviewed and stable  Post vital signs: Reviewed and stable  Last Vitals:  Vitals:   08/25/17 1201 08/25/17 1216  BP: 132/83 128/84  Pulse: 85 86    Last Pain: There were no vitals filed for this visit.       Complications: No apparent anesthesia complications

## 2017-08-26 ENCOUNTER — Encounter: Payer: Self-pay | Admitting: Obstetrics and Gynecology

## 2017-08-26 LAB — BPAM RBC
BLOOD PRODUCT EXPIRATION DATE: 201901172359
BLOOD PRODUCT EXPIRATION DATE: 201901172359
Unit Type and Rh: 9500
Unit Type and Rh: 9500

## 2017-08-26 LAB — CBC
HCT: 29.2 % — ABNORMAL LOW (ref 35.0–47.0)
Hemoglobin: 9.9 g/dL — ABNORMAL LOW (ref 12.0–16.0)
MCH: 32.5 pg (ref 26.0–34.0)
MCHC: 34.1 g/dL (ref 32.0–36.0)
MCV: 95.4 fL (ref 80.0–100.0)
PLATELETS: 171 10*3/uL (ref 150–440)
RBC: 3.06 MIL/uL — AB (ref 3.80–5.20)
RDW: 13.5 % (ref 11.5–14.5)
WBC: 9.3 10*3/uL (ref 3.6–11.0)

## 2017-08-26 LAB — TYPE AND SCREEN
ABO/RH(D): O NEG
Antibody Screen: POSITIVE
UNIT DIVISION: 0
Unit division: 0

## 2017-08-26 LAB — RPR: RPR Ser Ql: NONREACTIVE

## 2017-08-26 LAB — FETAL SCREEN: Fetal Screen: NEGATIVE

## 2017-08-26 MED ORDER — RHO D IMMUNE GLOBULIN 1500 UNIT/2ML IJ SOSY
300.0000 ug | PREFILLED_SYRINGE | Freq: Once | INTRAMUSCULAR | Status: AC
  Administered 2017-08-26: 300 ug via INTRAVENOUS
  Filled 2017-08-26: qty 2

## 2017-08-26 MED ORDER — LABETALOL HCL 200 MG PO TABS
200.0000 mg | ORAL_TABLET | Freq: Two times a day (BID) | ORAL | Status: DC
  Administered 2017-08-26 – 2017-08-27 (×3): 200 mg via ORAL
  Filled 2017-08-26 (×3): qty 1

## 2017-08-26 NOTE — Progress Notes (Signed)
Subjective:  Doing well no concerns.  Minimal to moderate lochia.  Tolerating po.  No fevers, no chills.  Pain well controlled  Objective:  Blood pressure 140/79, pulse 76, temperature 98.8 F (37.1 C), temperature source Oral, resp. rate 18, height 5' 7" (1.702 m), weight 286 lb (129.7 kg), last menstrual period 11/28/2016, SpO2 98 %, unknown if currently breastfeeding.  Intake/Output      12/18 0701 - 12/19 0700 12/19 0701 - 12/20 0700   P.O. 250    I.V. (mL/kg) 800 (6.2)    Total Intake(mL/kg) 1050 (8.1)    Urine (mL/kg/hr) 570    Blood 929    Total Output 1499    Net -449           General: NAD Pulmonary: no increased work of breathing Abdomen: non-distended, non-tender, fundus firm at level of umbilicus Incision: Extremities: no edema, no erythema, no tenderness  Results for orders placed or performed during the hospital encounter of 08/25/17 (from the past 72 hour(s))  CBC     Status: Abnormal   Collection Time: 08/25/17  4:15 PM  Result Value Ref Range   WBC 9.1 3.6 - 11.0 K/uL   RBC 3.45 (L) 3.80 - 5.20 MIL/uL   Hemoglobin 11.3 (L) 12.0 - 16.0 g/dL   HCT 33.1 (L) 35.0 - 47.0 %   MCV 96.0 80.0 - 100.0 fL   MCH 32.8 26.0 - 34.0 pg   MCHC 34.2 32.0 - 36.0 g/dL   RDW 13.8 11.5 - 14.5 %   Platelets 198 150 - 440 K/uL  Type and screen Index     Status: None (Preliminary result)   Collection Time: 08/25/17  4:15 PM  Result Value Ref Range   ABO/RH(D) O NEG    Antibody Screen POS    Sample Expiration 08/28/2017    Antibody Identification PASSIVELY ACQUIRED ANTI-D    Unit Number [id number]    Blood Component Type RBC LR PHER1    Unit division 00    Status of Unit ALLOCATED    Transfusion Status OK TO TRANSFUSE    Crossmatch Result COMPATIBLE    Unit Number [id number]    Blood Component Type RED CELLS,LR    Unit division 00    Status of Unit ALLOCATED    Transfusion Status OK TO TRANSFUSE    Crossmatch Result COMPATIBLE     Protein / creatinine ratio, urine     Status: Abnormal   Collection Time: 08/25/17  4:15 PM  Result Value Ref Range   Creatinine, Urine 60 mg/dL   Total Protein, Urine 14 mg/dL    Comment: NO NORMAL RANGE ESTABLISHED FOR THIS TEST   Protein Creatinine Ratio 0.23 (H) 0.00 - 0.15 mg/mg[Cre]  Comprehensive metabolic panel     Status: Abnormal   Collection Time: 08/25/17  4:15 PM  Result Value Ref Range   Sodium 135 135 - 145 mmol/L   Potassium 3.9 3.5 - 5.1 mmol/L   Chloride 105 101 - 111 mmol/L   CO2 20 (L) 22 - 32 mmol/L   Glucose, Bld 76 65 - 99 mg/dL   BUN 7 6 - 20 mg/dL   Creatinine, Ser 0.47 0.44 - 1.00 mg/dL   Calcium 9.1 8.9 - 10.3 mg/dL   Total Protein 6.3 (L) 6.5 - 8.1 g/dL   Albumin 3.0 (L) 3.5 - 5.0 g/dL   AST 23 15 - 41 U/L   ALT 13 (L) 14 - 54 U/L   Alkaline Phosphatase  214 (H) 38 - 126 U/L   Total Bilirubin 0.6 0.3 - 1.2 mg/dL   GFR calc non Af Amer >60 >60 mL/min   GFR calc Af Amer >60 >60 mL/min    Comment: (NOTE) The eGFR has been calculated using the CKD EPI equation. This calculation has not been validated in all clinical situations. eGFR's persistently <60 mL/min signify possible Chronic Kidney Disease.    Anion gap 10 5 - 15  CBC     Status: Abnormal   Collection Time: 08/26/17  4:18 AM  Result Value Ref Range   WBC 9.3 3.6 - 11.0 K/uL   RBC 3.06 (L) 3.80 - 5.20 MIL/uL   Hemoglobin 9.9 (L) 12.0 - 16.0 g/dL   HCT 29.2 (L) 35.0 - 47.0 %   MCV 95.4 80.0 - 100.0 fL   MCH 32.5 26.0 - 34.0 pg   MCHC 34.1 32.0 - 36.0 g/dL   RDW 13.5 11.5 - 14.5 %   Platelets 171 150 - 440 K/uL     Assessment:   36 y.o. G4P1031 postoperativeday # 1 LTCS for unstable fetal lie   Plan:  1) Acute blood loss anemia - hemodynamically stable and asymptomatic - po ferrous sulfate  2) Blood Type --/--/O NEG (12/18 1615) / Rubella Immune 11.30 (05/18 1528) / Varicella Immune  3) TDAP status UTD  4) Breast/Undecided  5) Disposition - anticipate discharge POD3-4  

## 2017-08-26 NOTE — Anesthesia Postprocedure Evaluation (Signed)
Anesthesia Post Note  Patient: Renaldo FiddlerAshleigh V Hunkins  Procedure(s) Performed: CESAREAN SECTION (N/A )  Patient location during evaluation: Mother Baby Anesthesia Type: Spinal Level of consciousness: awake and alert and oriented Pain management: pain level controlled Vital Signs Assessment: post-procedure vital signs reviewed and stable Respiratory status: respiratory function stable Cardiovascular status: blood pressure returned to baseline Postop Assessment: no headache, no backache, spinal receding, no apparent nausea or vomiting, adequate PO intake and patient able to bend at knees Anesthetic complications: no     Last Vitals:  Vitals:   08/26/17 0025 08/26/17 0305  BP: 140/82 140/79  Pulse: 75 76  Resp:  18  Temp:    SpO2:  98%    Last Pain:  Vitals:   08/25/17 2300  TempSrc: Oral  PainSc:                  Clydene PughBeane, Joell Buerger D

## 2017-08-26 NOTE — Anesthesia Post-op Follow-up Note (Signed)
  Anesthesia Pain Follow-up Note  Patient: Tiffany Chapman  Day #: 1  Date of Follow-up: 08/26/2017 Time: 7:21 AM  Last Vitals:  Vitals:   08/26/17 0025 08/26/17 0305  BP: 140/82 140/79  Pulse: 75 76  Resp:  18  Temp:    SpO2:  98%    Level of Consciousness: alert  Pain: none   Side Effects:None  Catheter Site Exam:clean, dry     Plan: D/C from anesthesia care at surgeon's request  Clydene PughBeane, Jaqualyn Juday D

## 2017-08-27 LAB — RHOGAM INJECTION: UNIT DIVISION: 0

## 2017-08-27 LAB — COMPREHENSIVE METABOLIC PANEL
ALBUMIN: 2.6 g/dL — AB (ref 3.5–5.0)
ALK PHOS: 163 U/L — AB (ref 38–126)
ALT: 14 U/L (ref 14–54)
AST: 26 U/L (ref 15–41)
Anion gap: 7 (ref 5–15)
BUN: 10 mg/dL (ref 6–20)
CALCIUM: 9.1 mg/dL (ref 8.9–10.3)
CO2: 24 mmol/L (ref 22–32)
CREATININE: 0.53 mg/dL (ref 0.44–1.00)
Chloride: 108 mmol/L (ref 101–111)
GFR calc Af Amer: >60 mL/min (ref >60)
GFR calc non Af Amer: >60 mL/min (ref >60)
GLUCOSE: 98 mg/dL (ref 65–99)
Potassium: 4.3 mmol/L (ref 3.5–5.1)
SODIUM: 139 mmol/L (ref 135–145)
Total Bilirubin: 0.4 mg/dL (ref 0.3–1.2)
Total Protein: 6.1 g/dL — ABNORMAL LOW (ref 6.5–8.1)

## 2017-08-27 LAB — PROTEIN / CREATININE RATIO, URINE
Creatinine, Urine: 87 mg/dL
Protein Creatinine Ratio: 0.15 mg/mg{Cre} (ref 0.00–0.15)
Total Protein, Urine: 13 mg/dL

## 2017-08-27 MED ORDER — LABETALOL HCL 200 MG PO TABS
200.0000 mg | ORAL_TABLET | Freq: Once | ORAL | Status: AC
  Administered 2017-08-27: 200 mg via ORAL
  Filled 2017-08-27: qty 1

## 2017-08-27 MED ORDER — LABETALOL HCL 5 MG/ML IV SOLN: 20.0000 mg | INTRAVENOUS | Status: DC | PRN

## 2017-08-27 MED ORDER — LABETALOL HCL 200 MG PO TABS
400.0000 mg | ORAL_TABLET | Freq: Two times a day (BID) | ORAL | Status: DC
  Administered 2017-08-27 – 2017-08-28 (×2): 400 mg via ORAL
  Filled 2017-08-27 (×2): qty 2

## 2017-08-27 MED ORDER — FERROUS SULFATE 325 (65 FE) MG PO TABS
325.0000 mg | ORAL_TABLET | Freq: Two times a day (BID) | ORAL | Status: DC
  Administered 2017-08-27 – 2017-08-28 (×2): 325 mg via ORAL
  Filled 2017-08-27 (×2): qty 1

## 2017-08-27 MED ORDER — HYDRALAZINE HCL 20 MG/ML IJ SOLN: 10.0000 mg | Freq: Once | INTRAMUSCULAR | Status: DC | PRN

## 2017-08-27 NOTE — Progress Notes (Addendum)
POD#1 pLTCS Subjective:  Resting in bed. Able to get some sleep last night. Denies visual changes, epigastric pain, headache. Ambulating and voiding without difficulty. Tolerating a regular diet. Not yet passing flatus.   Objective:  Blood pressure (!) 179/86, pulse 73, temperature 98 F (36.7 C), temperature source Oral, resp. rate 20, height '5\' 7"'  (1.702 m), weight 286 lb (129.7 kg), last menstrual period 11/28/2016, SpO2 100 %, currently breastfeeding.  General: NAD Pulmonary: CTAB, no increased work of breathing Abdomen: non-distended, non-tender, fundus firm at level of umbilicus, lochia rubra small, BS x4 Incision: dressing C/D/I and On Q infusing Extremities: trace edema, no erythema, no tenderness  Results for orders placed or performed during the hospital encounter of 08/25/17 (from the past 72 hour(s))  CBC     Status: Abnormal   Collection Time: 08/25/17  4:15 PM  Result Value Ref Range   WBC 9.1 3.6 - 11.0 K/uL   RBC 3.45 (L) 3.80 - 5.20 MIL/uL   Hemoglobin 11.3 (L) 12.0 - 16.0 g/dL   HCT 33.1 (L) 35.0 - 47.0 %   MCV 96.0 80.0 - 100.0 fL   MCH 32.8 26.0 - 34.0 pg   MCHC 34.2 32.0 - 36.0 g/dL   RDW 13.8 11.5 - 14.5 %   Platelets 198 150 - 440 K/uL  Type and screen [place]     Status: None   Collection Time: 08/25/17  4:15 PM  Result Value Ref Range   ABO/RH(D) O NEG    Antibody Screen POS    Sample Expiration 08/28/2017    Antibody Identification PASSIVELY ACQUIRED ANTI-D    Unit Number [id number]    Blood Component Type RBC LR PHER1    Unit division 00    Status of Unit REL FROM [facility]    Transfusion Status OK TO TRANSFUSE    Crossmatch Result COMPATIBLE    Unit Number [id number]    Blood Component Type RED CELLS,LR    Unit division 00    Status of Unit REL FROM [facility]    Transfusion Status OK TO TRANSFUSE    Crossmatch Result COMPATIBLE   RPR     Status: None   Collection Time: 08/25/17  4:15 PM  Result Value Ref Range   RPR  Ser Ql Non Reactive Non Reactive    Comment: (NOTE) Performed At: [facility] [place], [place] [id number] Rush Farmer MD LK:[id number]   Protein / creatinine ratio, urine     Status: Abnormal   Collection Time: 08/25/17  4:15 PM  Result Value Ref Range   Creatinine, Urine 60 mg/dL   Total Protein, Urine 14 mg/dL    Comment: NO NORMAL RANGE ESTABLISHED FOR THIS TEST   Protein Creatinine Ratio 0.23 (H) 0.00 - 0.15 mg/mg[Cre]  Comprehensive metabolic panel     Status: Abnormal   Collection Time: 08/25/17  4:15 PM  Result Value Ref Range   Sodium 135 135 - 145 mmol/L   Potassium 3.9 3.5 - 5.1 mmol/L   Chloride 105 101 - 111 mmol/L   CO2 20 (L) 22 - 32 mmol/L   Glucose, Bld 76 65 - 99 mg/dL   BUN 7 6 - 20 mg/dL   Creatinine, Ser 0.47 0.44 - 1.00 mg/dL   Calcium 9.1 8.9 - 10.3 mg/dL   Total Protein 6.3 (L) 6.5 - 8.1 g/dL   Albumin 3.0 (L) 3.5 - 5.0 g/dL   AST 23 15 - 41 U/L   ALT 13 (L) 14 -  54 U/L   Alkaline Phosphatase 214 (H) 38 - 126 U/L   Total Bilirubin 0.6 0.3 - 1.2 mg/dL   GFR calc non Af Amer >60 >60 mL/min   GFR calc Af Amer >60 >60 mL/min    Comment: (NOTE) The eGFR has been calculated using the CKD EPI equation. This calculation has not been validated in all clinical situations. eGFR's persistently <60 mL/min signify possible Chronic Kidney Disease.    Anion gap 10 5 - 15  Fetal screen     Status: None   Collection Time: 08/26/17  4:18 AM  Result Value Ref Range   Fetal Screen NEG   Rhogam injection     Status: None   Collection Time: 08/26/17  4:18 AM  Result Value Ref Range   Unit Number [id number]    Blood Component Type RHIG    Unit division 00    Status of Unit ISSUED,FINAL    Transfusion Status OK TO TRANSFUSE   CBC     Status: Abnormal   Collection Time: 08/26/17  4:18 AM  Result Value Ref Range   WBC 9.3 3.6 - 11.0 K/uL   RBC 3.06 (L) 3.80 - 5.20 MIL/uL   Hemoglobin 9.9 (L) 12.0 - 16.0 g/dL   HCT 29.2 (L) 35.0 - 47.0  %   MCV 95.4 80.0 - 100.0 fL   MCH 32.5 26.0 - 34.0 pg   MCHC 34.1 32.0 - 36.0 g/dL   RDW 13.5 11.5 - 14.5 %   Platelets 171 150 - 440 K/uL     Assessment:   36 y.o. [id number] postoperative day # 1 in stable condition with elevated blood pressures.   Plan:  1) Acute blood loss anemia - hemodynamically stable and asymptomatic - PO ferrous sulfate  2) Blood Type --/--/O NEG (12/18 1615) / Rubella 11.30 (05/18 1528) / Varicella immune Information for the patient's newborn:  Tenesha, Garza [[id number]]  O POS RHIG given.  3) TDAP status: given 07/02/2017. Influenza given 06/18/2017  4) Breast feeding  5) Contraception: [place]  6) Disposition: Increase labetalol to 400 mg BID. Observe for signs of pre-eclampsia. CMP and protein creatinine ratio ordered.  Avel Sensor, CNM 08/27/2017  9:19 AM

## 2017-08-28 MED ORDER — OXYCODONE-ACETAMINOPHEN 5-325 MG PO TABS: 1.0000 | ORAL_TABLET | ORAL | 0 refills | Status: DC | PRN

## 2017-08-28 MED ORDER — LABETALOL HCL 200 MG PO TABS: 400.0000 mg | ORAL_TABLET | Freq: Two times a day (BID) | ORAL | 5 refills | Status: DC

## 2017-08-28 NOTE — Progress Notes (Signed)
Patient discharged home with infant and spouse. Discharge instructions, prescriptions, hygiene kit and follow up appointment given to and reviewed with patient and spouse. Patient verbalized understanding. Escorted out via wheelchair by auxiliary.  

## 2017-08-28 NOTE — Progress Notes (Signed)
Period of purple cry video watched by parents. Parents verbalized understanding and had no questions.  

## 2017-08-28 NOTE — Plan of Care (Signed)
Progressing well; voiding independently; taking only scheduled motrin; ambulates to SCN to see baby

## 2017-09-04 ENCOUNTER — Ambulatory Visit (INDEPENDENT_AMBULATORY_CARE_PROVIDER_SITE_OTHER): Payer: 59 | Admitting: Obstetrics and Gynecology

## 2017-09-04 ENCOUNTER — Encounter: Payer: Self-pay | Admitting: Obstetrics and Gynecology

## 2017-09-04 VITALS — BP 134/82 | HR 100 | Ht 67.0 in | Wt 257.0 lb

## 2017-09-04 DIAGNOSIS — Z4889 Encounter for other specified surgical aftercare: Secondary | ICD-10-CM

## 2017-09-04 NOTE — Progress Notes (Signed)
      Postoperative Follow-up Patient presents post op from LTCS 2weeks ago for unstable fetal lie, and worsening CHTN.  Subjective: Patient reports marked improvement in her preop symptoms. Eating a regular diet without difficulty. Pain is controlled without any medications.  Activity: normal activities of daily living.  Objective: Vitals:   09/04/17 0925  BP: 134/82  Pulse: 100   Physical Exam  Constitutional: She appears well-developed and well-nourished. No distress.  HENT:  Head: Normocephalic.  Cardiovascular: Normal rate.  Pulmonary/Chest: No respiratory distress.  Abdominal: Soft. Bowel sounds are normal. She exhibits no distension. There is no tenderness. There is no guarding.  Skin: She is not diaphoretic.  Incision D/C/I  Assessment: 36 y.o. s/p LTCS stable  Plan: Patient has done well after surgery with no apparent complications.  I have discussed the post-operative course to date, and the expected progress moving forward.  The patient understands what complications to be concerned about.  I will see the patient in routine follow up, or sooner if needed.    Activity plan: No heavy lifting. Contraception OCP Pap 01/14/17 NIL HPV negative  Vena Austriandreas Javonn Gauger 09/04/2017, 9:54 AM

## 2017-10-12 ENCOUNTER — Encounter: Payer: Self-pay | Admitting: Obstetrics and Gynecology

## 2017-10-12 ENCOUNTER — Ambulatory Visit (INDEPENDENT_AMBULATORY_CARE_PROVIDER_SITE_OTHER): Payer: Managed Care, Other (non HMO) | Admitting: Obstetrics and Gynecology

## 2017-10-12 MED ORDER — NORETHIN-ETH ESTRAD-FE BIPHAS 1 MG-10 MCG / 10 MCG PO TABS: 1.0000 | ORAL_TABLET | Freq: Every day | ORAL | 3 refills | Status: DC

## 2017-10-12 NOTE — Progress Notes (Signed)
Postpartum Visit  Chief Complaint:  Chief Complaint  Patient presents with  . Postpartum Care    C/S on 12/17    History of Present Illness: Patient is a 37 y.o. [id number] presents for postpartum visit.   Review the Delivery Report for details.  Date of delivery:  08/25/17 Type of delivery: C-section Pregnancy or labor problems:  CHTN and polyhydramnios, C-section for unstable fetal lie Any problems since the delivery:  no  Newborn Details:  SINGLETON :  1. BabyGender: female Birth weight: 9lbs 0oz Maternal Details:  Breast Feeding:  no Post partum depression/anxiety noted:  no Edinburgh Post-Partum Depression Score:  1  Date of last PAP: 01/14/17 NIL HPV negative  Review of Systems: Review of Systems  Constitutional: Negative for chills and fever.  HENT: Negative for congestion.   Respiratory: Negative for cough and shortness of breath.   Cardiovascular: Negative for chest pain and palpitations.  Gastrointestinal: Negative for abdominal pain, constipation, diarrhea, heartburn, nausea and vomiting.  Genitourinary: Negative for dysuria, frequency and urgency.  Skin: Negative for itching and rash.  Neurological: Negative for dizziness and headaches.  Endo/Heme/Allergies: Negative for polydipsia.  Psychiatric/Behavioral: Negative for depression.    The following portions of the patient's history were reviewed and updated as appropriate: allergies, current medications, past family history, past medical history, past social history, past surgical history and problem list.  Past Medical History:  Past Medical History:  Diagnosis Date  . Anxiety   . 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 Dysmenorrhea   . Habitual aborter, currently pregnant   . Hypertension    labetalol discontinued in December 2017  . Missed abortion 02/2016, 06/2016  . Obesity     Past Surgical History:  Past Surgical History:  Procedure Laterality Date  . BREAST ENHANCEMENT  SURGERY Bilateral   . CESAREAN SECTION N/A 08/25/2017   Procedure: CESAREAN SECTION;  Surgeon: Vena AustriaStaebler, Jayce Kainz, MD;  Location: [facility] ORS;  Service: Obstetrics;  Laterality: N/A;  birth at [id number] weight 9lb 0oz apgar 9/9  . COLPOSCOPY  01/01/2012   Benign  . DILATION AND EVACUATION N/A 02/15/2016   Procedure: DILATATION AND EVACUATION;  Surgeon: Vena AustriaAndreas Dewitte Vannice, MD;  Location: [facility] ORS;  Service: Gynecology;  Laterality: N/A;  . DILATION AND EVACUATION N/A 06/10/2016   Procedure: DILATATION AND EVACUATION;  Surgeon: Vena AustriaAndreas Givanni Staron, MD;  Location: [facility] ORS;  Service: Gynecology;  Laterality: N/A;  . TONSILLECTOMY  age 37    Family History:  Family History  Problem Relation Age of Onset  . Diabetes Mother   . Multiple sclerosis Mother   . Hypertension Mother   . Lung cancer Maternal Grandmother 50  . Lung cancer Father        died at age 37  . Hypertension Father   . Diabetes Father     Social History:  Social History   Socioeconomic History  . Marital status: Married    Spouse name: Not on file  . Number of children: 0  . Years of education: Not on file  . Highest education level: Not on file  Social Needs  . Financial resource strain: Not on file  . Food insecurity - worry: Not on file  . Food insecurity - inability: Not on file  . Transportation needs - medical: Not on file  . Transportation needs - non-medical: Not on file  Occupational History  . Not on file  Tobacco Use  . Smoking status: Former Smoker    Packs/day:  0.50    Years: 15.00    Pack years: 7.50    Types: Cigarettes    Last attempt to quit: 12/30/2016    Years since quitting: 0.7  . Smokeless tobacco: Former Engineer, water and Sexual Activity  . Alcohol use: No  . Drug use: No  . Sexual activity: Yes    Birth control/protection: None  Other Topics Concern  . Not on file  Social History Narrative   Greig Castilla is FOB, and involved with care    Allergies:  No Known  Allergies  Medications: Prior to Admission medications   Medication Sig Start Date End Date Taking? Authorizing Provider  escitalopram (LEXAPRO) 10 MG tablet Take 1 tablet (10 mg total) by mouth daily. 06/22/17 06/22/18 Yes Vena Austria, MD  labetalol (NORMODYNE) 200 MG tablet Take 2 tablets (400 mg total) by mouth 2 (two) times daily. 08/28/17  Yes Nadara Mustard, MD    Physical Exam Vitals:  Vitals:   10/12/17 1021  BP: 118/70  Pulse: 81    General: NAD HEENT: normocephalic, anicteric Pulmonary: No increased work of breathing Abdomen: NABS, soft, non-tender, non-distended.  Umbilicus without lesions.  No hepatomegaly, splenomegaly or masses palpable. No evidence of hernia. Incision D/C/I Genitourinary:  External: Normal external female genitalia.  Normal urethral meatus, normal  Bartholin's and Skene's glands.    Vagina: Normal vaginal mucosa, no evidence of prolapse.    Cervix: Grossly normal in appearance, no bleeding  Uterus: Non-enlarged, mobile, normal contour.  No CMT  Adnexa: ovaries non-enlarged, no adnexal masses  Rectal: deferred Extremities: no edema, erythema, or tenderness Neurologic: Grossly intact Psychiatric: mood appropriate, affect full  Assessment: 37 y.o. [id number] presenting for 6 week postpartum visit  Plan: Problem List Items Addressed This Visit    None    Visit Diagnoses    Encounter for postpartum visit    -  Primary       1) Contraception Education given regarding options for contraception, including oral contraceptives. Patient plans OCPs for contraception. Pros and cons and systemic estrogen discussed with patient. She is not breast feeding, nor does she have any other medical contra-indications to estrogen use as listed in WHO guidelines for contraceptive use.  While effective at preventing pregnancy combination oral contraceptive pills do not prevent transmission of sexually transmitted diseases and use of barrier methods for this  purpose was discussed.   - lo loestrin fe  2)  Pap - ASCCP guidelines and rational discussed.  Patient opts for q3year screening interval  3) Patient underwent screening for postpartum depression with no concerns noted.  4) Follow up 1 year for routine annual exam

## 2017-10-21 ENCOUNTER — Telehealth: Payer: Self-pay

## 2017-10-21 NOTE — Telephone Encounter (Signed)
Additional form for [employer] filled out, signature obtained, and given to [place] for processing.

## 2017-11-23 ENCOUNTER — Encounter: Payer: Self-pay | Admitting: Obstetrics and Gynecology

## 2017-11-23 NOTE — Telephone Encounter (Signed)
Hi,  This was a message from your patient.  Thanks,  Christanna

## 2017-11-24 ENCOUNTER — Other Ambulatory Visit: Payer: Self-pay | Admitting: Obstetrics and Gynecology

## 2017-11-24 MED ORDER — NORGESTIMATE-ETH ESTRADIOL 0.25-35 MG-MCG PO TABS: 1.0000 | ORAL_TABLET | Freq: Every day | ORAL | 3 refills | Status: DC

## 2018-02-26 ENCOUNTER — Other Ambulatory Visit: Payer: Self-pay | Admitting: Obstetrics & Gynecology

## 2018-03-01 NOTE — Telephone Encounter (Signed)
Pease advise.

## 2018-04-09 ENCOUNTER — Encounter: Payer: Self-pay | Admitting: Obstetrics and Gynecology

## 2018-04-09 ENCOUNTER — Other Ambulatory Visit: Payer: Self-pay | Admitting: Obstetrics and Gynecology

## 2018-04-09 MED ORDER — ESCITALOPRAM OXALATE 10 MG PO TABS: 10.0000 mg | ORAL_TABLET | Freq: Every day | ORAL | 3 refills | Status: DC

## 2018-05-07 IMAGING — US US MFM OB DETAIL+14 WK
1 series · 12 of 28 positions shown · non-contrast
Comparison: none

PATIENT INFO:

PERFORMED BY:
SERVICE(S) PROVIDED:
INDICATIONS:
33 weeks gestation of pregnancy
FETAL EVALUATION:
Num Of Fetuses:     1
Fetal Heart         143
Rate(bpm):
Cardiac Activity:   Present
Presentation:       Cephalic
Placenta:           Anterior No previa
AFI Sum(cm)     %Tile       Largest Pocket(cm)
27.[REDACTED]
RUQ(cm)       RLQ(cm)       LUQ(cm)        LLQ(cm)
11.15
BIOMETRY:
BPD:      89.8  mm     G. Age:  36w 3d         97  %    CI:        75.19   %    70 - 86
FL/HC:      19.4   %    19.4 -
HC:      328.5  mm     G. Age:  37w 2d         91  %    HC/AC:      1.03        0.96 -
AC:      319.5  mm     G. Age:  35w 6d         94  %    FL/BPD:     70.9   %    71 - 87
FL:       63.7  mm     G. Age:  33w 0d         19  %    FL/AC:      19.9   %    20 - 24
HUM:      55.6  mm     G. Age:  32w 3d         26  %
Est. FW:    2344  gm    5 lb 13 oz      80  %
GESTATIONAL AGE:
LMP:           33w 6d        Date:  11/28/16                 EDD:   09/04/17
U/S Today:     35w 5d                                        EDD:   08/22/17
Best:          33w 6d     Det. By:  LMP  (11/28/16)          EDD:   09/04/17
ANATOMY:
Cavum:                 CSP visualized         Ductal Arch:            Normal appearance
Ventricles:            Normal appearance      Diaphragm:              Within Normal Limits
Cerebellum:            Within Normal Limits   Stomach:                Seen
Posterior Fossa:       Within Normal Limits   Abdomen:                Within Normal
Limits
Nuchal Fold:           Within Normal Limits   Abdominal Wall:         Normal appearance
Face:                  Orbits visualized      Cord Vessels:           3 vessels
Lips:                  Normal appearance      Kidneys:                Normal appearance
Thoracic:              Within Normal Limits   Bladder:                Seen
Heart:                 4-Chamber view         Spine:                  Normal appearance
appears normal
RVOT:                  Normal appearance      Upper Extremities:      Visualized
LVOT:                  Normal appearance      Lower Extremities:      Visualized
Aortic Arch:           Normal appearance

[Series 1: us mfm ob detail+14 wk · 0.25mm/px · 12 of 69 slices shown]
[im 3/69]
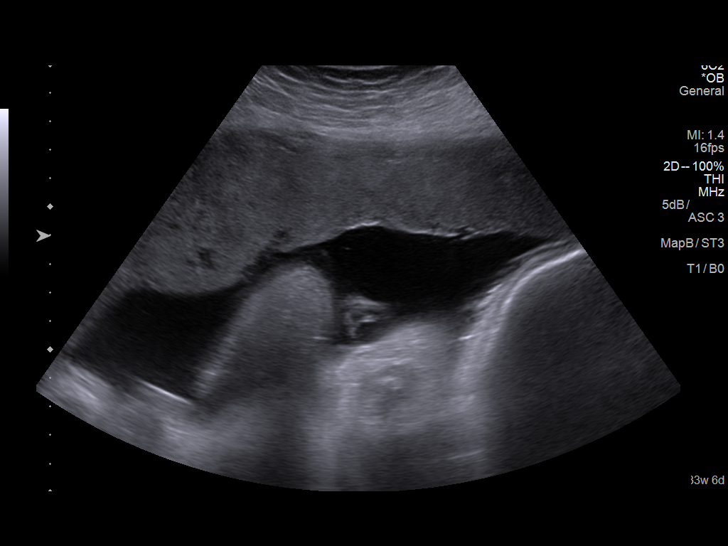
[im 8/69]
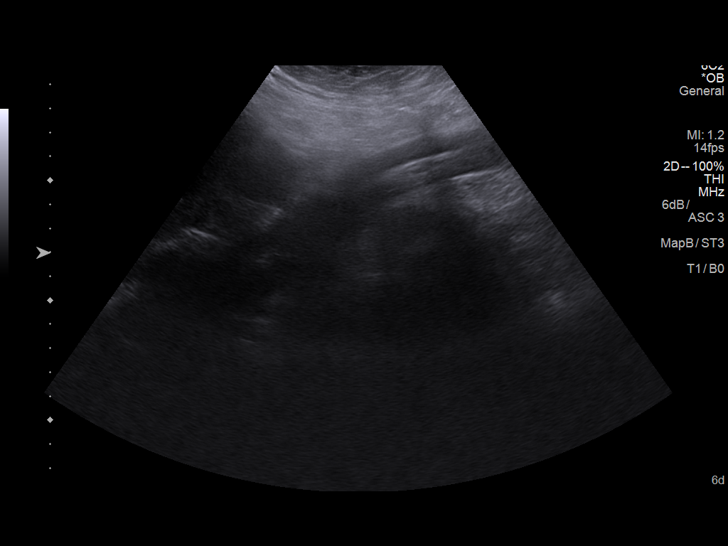
[im 13/69]
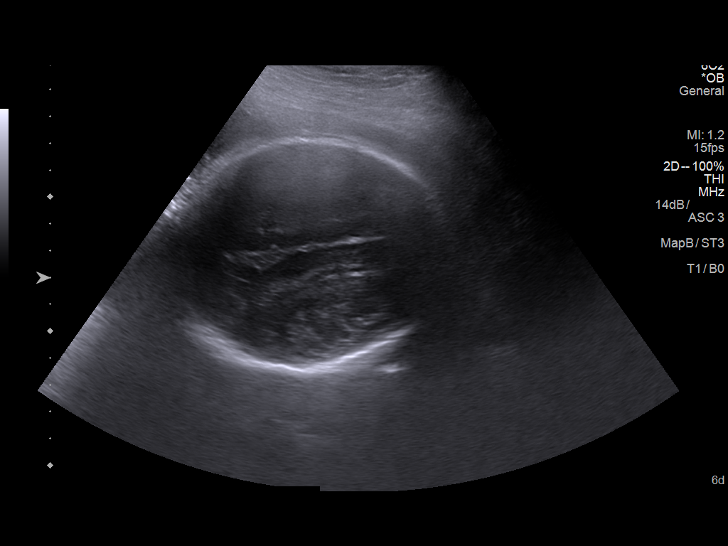
[im 21/69]
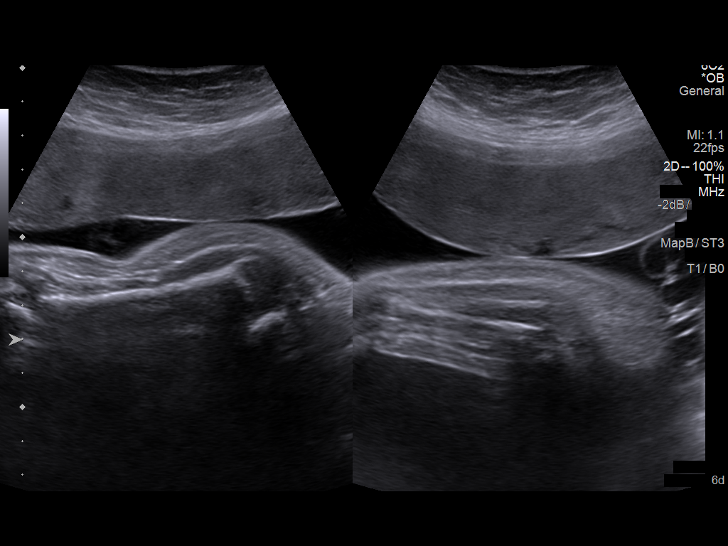
[im 26/69]
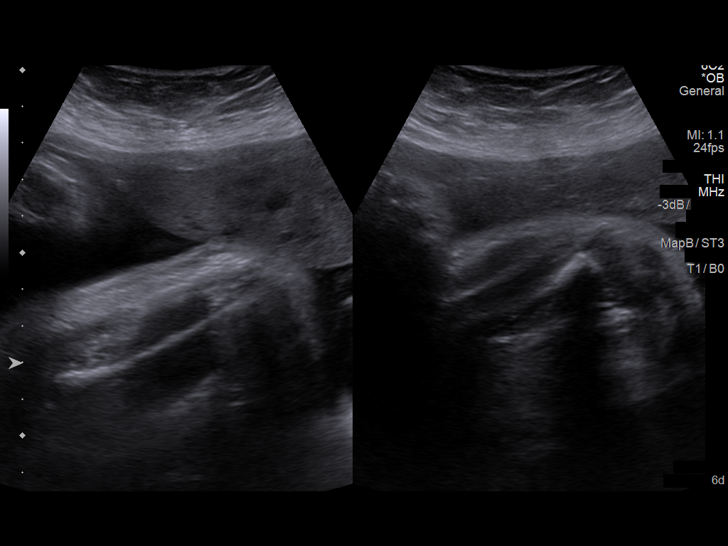
[im 31/69]
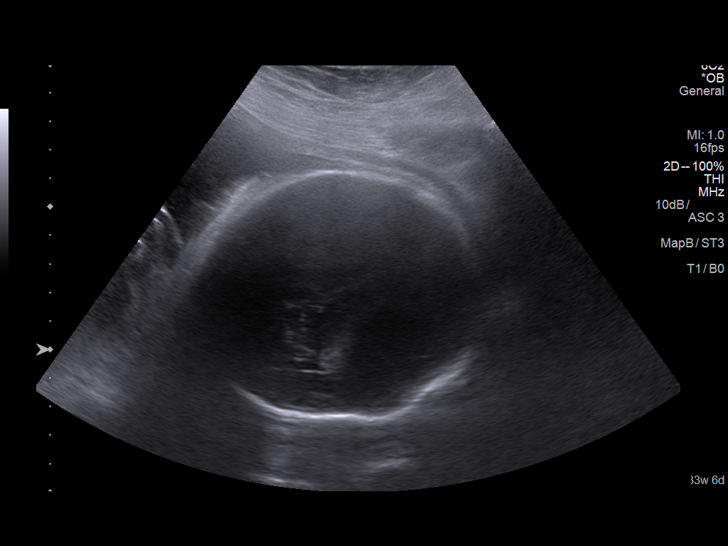
[im 38/69]
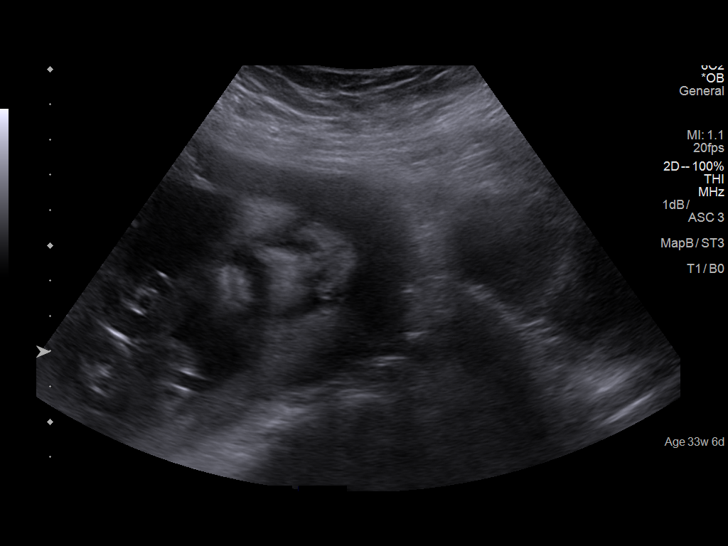
[im 43/69]
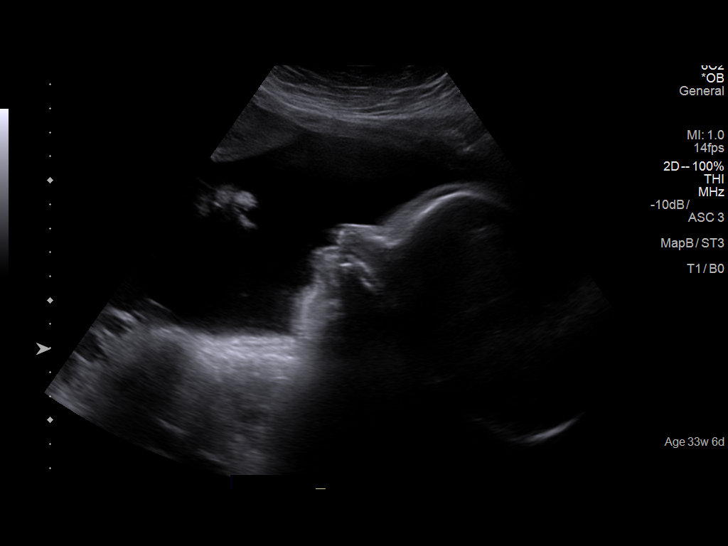
[im 48/69]
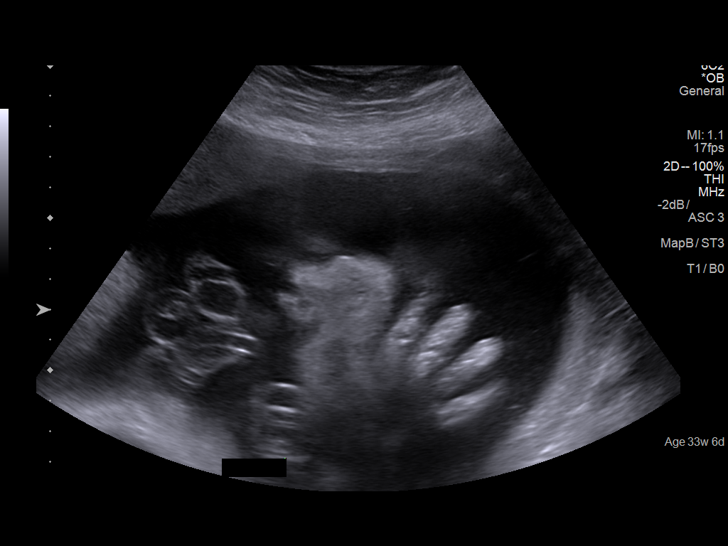
[im 56/69]
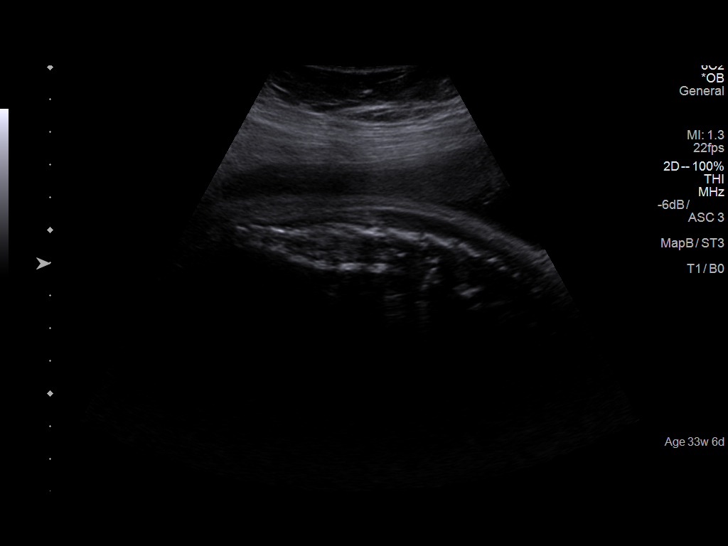
[im 61/69]
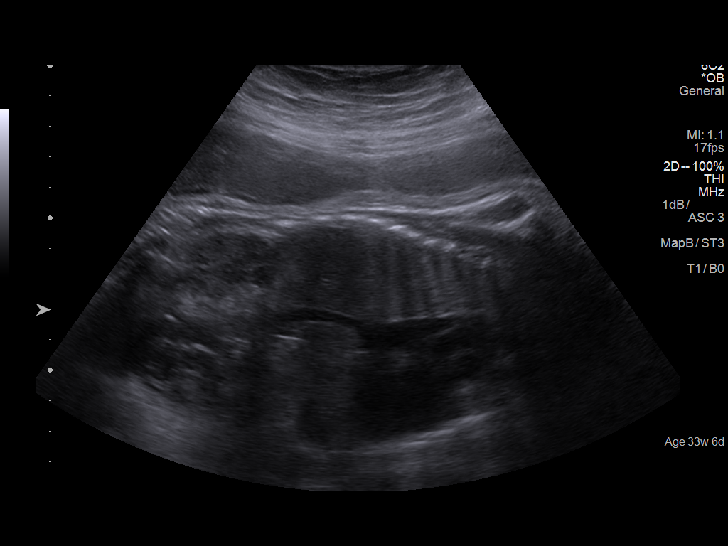
[im 66/69]
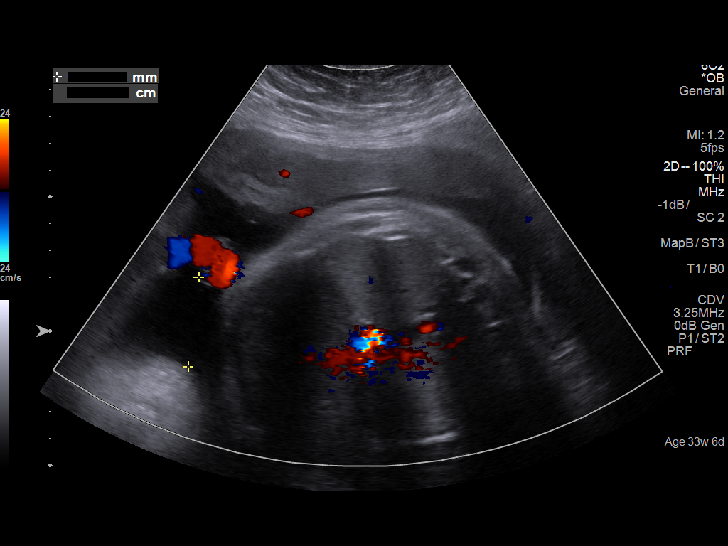

[12 of 28 positions shown; findings below may reference images not displayed]

IMPRESSION: Dear Dr.   AMAZIGH ,

Thank you for referring your patient for detailed anaotmic
survey due to polyhydramnios noted on her ultrasound in
your office. She reports normal glucose screening.
Pregnancy is also significant for recurrent pregnancy loss,
Htn, BMI 40 and anxiety/depression.

Ultrasound demonstrates a single live, intrauterine pregnancy
at 33 weeks 6 days.   Dating is by LMP consistent with
ultrasound performed at [facility] on 01/14/17;
measurements were consistent with 6 weeks 1 day.

The amniotic fluid volume is mildly elevated at 27cm (MVP
11cm) and active fetal movments are seen.

Detailed evaluation of the fetal anatomy was unremarkable.

Polyhydramnios.
These findings were reviewed.
Congenital anomalies in all organ systems have been linked
with excessive accumulation of amniotic fluid. The most
common structural defects associated with polyhydramnios
are those that interfere with fetal swallowing and/or
absorption of fluid, such as gastrointestinal obstruction due to
duodenal, esophageal, or intestinal atresia. Decreased
swallowing may also be due to neuromuscular disorders,
such as myotonic dystrophy.

An AFI <30 (mild polyhydramnios)  is more frequently
associated with idiopathic polyhydramnios, while an etiology
is more likely identified when the AFI >30.

The patient reports a normal GCT.  The fetus was noted to be
moving freely, and the nose and lips were visualized.  There
were no dilated loops of bowel, and the kidneys appeared
WNL.  The bladder was visualized and there was no sign of
fetal hydrops.

The patient was counseled regarding these factors and
possible etiologies; the patient was informed that in a normal
appearing fetus with normal movement in a non-diabetic
mother, she was reassured that these findings are most likely
consistent with  idiopathic polyhydramnios.

Recommend weekly NST/AFI and follow up growth in 4
weeks. Please schedule where convenient.

Thank you for allowing us to participate in your patient's care.
assistance.

## 2018-10-04 ENCOUNTER — Ambulatory Visit (INDEPENDENT_AMBULATORY_CARE_PROVIDER_SITE_OTHER): Payer: BLUE CROSS/BLUE SHIELD | Admitting: Obstetrics and Gynecology

## 2018-10-04 ENCOUNTER — Encounter: Payer: Self-pay | Admitting: Obstetrics and Gynecology

## 2018-10-04 ENCOUNTER — Other Ambulatory Visit (HOSPITAL_COMMUNITY)
Admission: RE | Admit: 2018-10-04 | Discharge: 2018-10-04 | Disposition: A | Discharge status: Home / Self Care | Payer: BLUE CROSS/BLUE SHIELD | Source: Ambulatory Visit | Attending: Obstetrics and Gynecology | Admitting: Obstetrics and Gynecology

## 2018-10-04 DIAGNOSIS — O0991 Supervision of high risk pregnancy, unspecified, first trimester: Secondary | ICD-10-CM

## 2018-10-04 DIAGNOSIS — O09529 Supervision of elderly multigravida, unspecified trimester: Secondary | ICD-10-CM | POA: Insufficient documentation

## 2018-10-04 DIAGNOSIS — O3680X0 Pregnancy with inconclusive fetal viability, not applicable or unspecified: Secondary | ICD-10-CM

## 2018-10-04 DIAGNOSIS — N96 Recurrent pregnancy loss: Secondary | ICD-10-CM

## 2018-10-04 DIAGNOSIS — O0990 Supervision of high risk pregnancy, unspecified, unspecified trimester: Secondary | ICD-10-CM

## 2018-10-04 DIAGNOSIS — O09521 Supervision of elderly multigravida, first trimester: Secondary | ICD-10-CM

## 2018-10-04 DIAGNOSIS — Z3A01 Less than 8 weeks gestation of pregnancy: Secondary | ICD-10-CM

## 2018-10-04 NOTE — Progress Notes (Signed)
NOB 

## 2018-10-04 NOTE — Progress Notes (Signed)
New Obstetric Patient H&P    Chief Complaint: "Desires prenatal care"   History of Present Illness: Patient is a 38 y.o. [id number] Not Hispanic or Latino female, presents with amenorrhea and positive home pregnancy test. Patient's last menstrual period was 08/12/2018 (exact date). and based on her  LMP, her EDD is Estimated Date of Delivery: 05/19/19 and her EGA is [redacted]w[redacted]d.   She had a urine pregnancy test which was positive 2 week(s)  ago. Her last menstrual period was normal. Since her LMP she claims she has experienced nausea, fatigue, breast tenderness. She denies vaginal bleeding. Her past medical history is noncontributory. Her prior pregnancies are notable for recurrent pregnancy loss  Since her LMP, she admits to the use of tobacco products  no There are cats in the home in the home  no She admits close contact with children on a regular basis  yes  She has had chicken pox in the past yes She has had Tuberculosis exposures, symptoms, or previously tested positive for TB   no Current or past history of domestic violence. no  Genetic Screening/Teratology Counseling: (Includes patient, baby's father, or anyone in either family with:)   1. Patient's age >/= 3 at [facility]  yes 2. Thalassemia ([place], [place], Mediterranean, or Asian background): MCV<80  no 3. Neural tube defect (meningomyelocele, spina bifida, anencephaly)  no 4. Congenital heart defect  no  5. Down syndrome  no 6. Tay-Sachs (Jewish, [place]  no 7. Canavan's Disease  no 8. Sickle cell disease or trait (African)  no  9. Hemophilia or other blood disorders  no  10. Muscular dystrophy  no  11. Cystic fibrosis  no  12. Huntington's Chorea  no  13. Mental retardation/autism  no 14. Other inherited genetic or chromosomal disorder  no 15. Maternal metabolic disorder (DM, PKU, etc)  no 16. Patient or FOB with a child with a birth defect not listed above no  16a. Patient or FOB with a birth defect themselves no 17.  Recurrent pregnancy loss, or stillbirth  no  18. Any medications since LMP other than prenatal vitamins (include vitamins, supplements, OTC meds, drugs, alcohol)  no 19. Any other genetic/environmental exposure to discuss  no  Infection History:   1. Lives with someone with TB or TB exposed  no  2. Patient or partner has history of genital herpes  no 3. Rash or viral illness since LMP  no 4. History of STI (GC, CT, HPV, syphilis, HIV)  no 5. History of recent travel :  no  Other pertinent information:  no     Review of Systems:10 point review of systems negative unless otherwise noted in HPI  Past Medical History:  Past Medical History:  Diagnosis Date  . Anxiety   . 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 Dysmenorrhea   . Habitual aborter, currently pregnant   . Hypertension    labetalol discontinued in December 2017  . Missed abortion 02/2016, 06/2016  . Obesity     Past Surgical History:  Past Surgical History:  Procedure Laterality Date  . BREAST ENHANCEMENT SURGERY Bilateral   . CESAREAN SECTION N/A 08/25/2017   Procedure: CESAREAN SECTION;  Surgeon: Vena Austria, MD;  Location: [facility] ORS;  Service: Obstetrics;  Laterality: N/A;  birth at 42 weight 9lb 0oz apgar 9/9  . COLPOSCOPY  01/01/2012   Benign  . DILATION AND EVACUATION N/A 02/15/2016   Procedure: DILATATION AND EVACUATION;  Surgeon: Vena Austria, MD;  Location:  [facility] ORS;  Service: Gynecology;  Laterality: N/A;  . DILATION AND EVACUATION N/A 06/10/2016   Procedure: DILATATION AND EVACUATION;  Surgeon: Vena Austria, MD;  Location: [facility] ORS;  Service: Gynecology;  Laterality: N/A;  . TONSILLECTOMY  age 68    Gynecologic History: Patient's last menstrual period was 08/12/2018 (exact date).  Obstetric History: [id number]  Family History:  Family History  Problem Relation Age of Onset  . Diabetes Mother   . Multiple sclerosis Mother   . Hypertension Mother   . Lung cancer  Maternal Grandmother 50  . Lung cancer Father        died at age 39  . Hypertension Father   . Diabetes Father     Social History:  Social History   Socioeconomic History  . Marital status: Married    Spouse name: Not on file  . Number of children: 0  . Years of education: Not on file  . Highest education level: Not on file  Occupational History  . Not on file  Social Needs  . Financial resource strain: Not on file  . Food insecurity:    Worry: Not on file    Inability: Not on file  . Transportation needs:    Medical: Not on file    Non-medical: Not on file  Tobacco Use  . Smoking status: Former Smoker    Packs/day: 0.50    Years: 15.00    Pack years: 7.50    Types: Cigarettes    Last attempt to quit: 12/30/2016    Years since quitting: 1.7  . Smokeless tobacco: Former Engineer, water and Sexual Activity  . Alcohol use: No  . Drug use: No  . Sexual activity: Yes    Birth control/protection: None  Lifestyle  . Physical activity:    Days per week: Not on file    Minutes per session: Not on file  . Stress: Not on file  Relationships  . Social connections:    Talks on phone: Not on file    Gets together: Not on file    Attends religious service: Not on file    Active member of club or organization: Not on file    Attends meetings of clubs or organizations: Not on file    Relationship status: Not on file  . Intimate partner violence:    Fear of current or ex partner: Not on file    Emotionally abused: Not on file    Physically abused: Not on file    Forced sexual activity: Not on file  Other Topics Concern  . Not on file  Social History Narrative   Greig Castilla is FOB, and involved with care    Allergies:  No Known Allergies  Medications: Prior to Admission medications   Medication Sig Start Date End Date Taking? Authorizing Provider  escitalopram (LEXAPRO) 10 MG tablet Take 1 tablet (10 mg total) by mouth daily. 04/09/18 04/09/19 Yes Vena Austria, MD    labetalol (NORMODYNE) 200 MG tablet TAKE 2 TABLETS BY MOUTH TWICE DAILY Patient not taking: Reported on 10/04/2018 03/02/18   Vena Austria, MD    Physical Exam Vitals: Blood pressure 124/82, weight 256 lb (116.1 kg), last menstrual period 08/12/2018, not currently breastfeeding.  General: NAD HEENT: normocephalic, anicteric Pulmonary: No increased work of breathing, CTAB Abdomen: NABS, soft, non-tender, non-distended.  Umbilicus without lesions.  No hepatomegaly, splenomegaly or masses palpable. No evidence of hernia  Genitourinary:  External: Normal external female genitalia.  Normal urethral meatus, normal  Bartholin's  and Skene's glands.    Vagina: Normal vaginal mucosa, no evidence of prolapse.    Cervix: Grossly normal in appearance, no bleeding  Uterus:  Non-enlarged, mobile, normal contour.  No CMT  Adnexa: ovaries non-enlarged, no adnexal masses  Rectal: deferred Extremities: no edema, erythema, or tenderness Neurologic: Grossly intact Psychiatric: mood appropriate, affect full   Assessment: 38 y.o. [id number] at 7774w4d presenting to initiate prenatal care  Plan: 1) Avoid alcoholic beverages. 2) Patient encouraged not to smoke.  3) Discontinue the use of all non-medicinal drugs and chemicals.  4) Take prenatal vitamins daily.  5) Nutrition, food safety (fish, cheese advisories, and high nitrite foods) and exercise discussed. 6) Hospital and practice style discussed with cross coverage system.  7) Genetic Screening, such as with 1st Trimester Screening, cell free fetal DNA, AFP testing, and Ultrasound, as well as with amniocentesis and CVS as appropriate, is discussed with patient. At the conclusion of today's visit patient requested genetic testing 8) Patient is asked about travel to areas at risk for the Zika virus, and counseled to avoid travel and exposure to mosquitoes or sexual partners who may have themselves been exposed to the virus. Testing is discussed, and will be  ordered as appropriate.   Vena AustriaAndreas Korra Christine, MD, Evern CoreFACOG [facility], [facility] 10/04/2018, 3:26 PM

## 2018-10-05 LAB — RPR+RH+ABO+RUB AB+AB SCR+CB...
Antibody Screen: NEGATIVE
HIV SCREEN 4TH GENERATION: NONREACTIVE
Hematocrit: 39.9 % (ref 34.0–46.6)
Hemoglobin: 13.4 g/dL (ref 11.1–15.9)
Hepatitis B Surface Ag: NEGATIVE
MCH: 30.7 pg (ref 26.6–33.0)
MCHC: 33.6 g/dL (ref 31.5–35.7)
MCV: 91 fL (ref 79–97)
Platelets: 261 10*3/uL (ref 150–450)
RBC: 4.37 x10E6/uL (ref 3.77–5.28)
RDW: 12 % (ref 11.7–15.4)
RPR: NONREACTIVE
RUBELLA: 12.1 {index} (ref >0.99)
Rh Factor: NEGATIVE
VARICELLA: 3308 {index} (ref >165)
WBC: 9.9 10*3/uL (ref 3.4–10.8)

## 2018-10-05 LAB — BETA HCG QUANT (REF LAB): HCG QUANT: 88572 m[IU]/mL

## 2018-10-06 ENCOUNTER — Other Ambulatory Visit: Payer: BLUE CROSS/BLUE SHIELD

## 2018-10-06 DIAGNOSIS — N96 Recurrent pregnancy loss: Secondary | ICD-10-CM

## 2018-10-06 DIAGNOSIS — O3680X0 Pregnancy with inconclusive fetal viability, not applicable or unspecified: Secondary | ICD-10-CM

## 2018-10-06 LAB — URINE CULTURE: ORGANISM ID, BACTERIA: NO GROWTH

## 2018-10-06 LAB — CERVICOVAGINAL ANCILLARY ONLY
CHLAMYDIA, DNA PROBE: NEGATIVE
NEISSERIA GONORRHEA: NEGATIVE

## 2018-10-07 ENCOUNTER — Ambulatory Visit (INDEPENDENT_AMBULATORY_CARE_PROVIDER_SITE_OTHER): Payer: BLUE CROSS/BLUE SHIELD | Admitting: Obstetrics and Gynecology

## 2018-10-07 VITALS — BP 130/80 | Wt 258.0 lb

## 2018-10-07 DIAGNOSIS — F329 Major depressive disorder, single episode, unspecified: Secondary | ICD-10-CM

## 2018-10-07 DIAGNOSIS — Z3A08 8 weeks gestation of pregnancy: Secondary | ICD-10-CM

## 2018-10-07 DIAGNOSIS — F419 Anxiety disorder, unspecified: Secondary | ICD-10-CM

## 2018-10-07 DIAGNOSIS — O09291 Supervision of pregnancy with other poor reproductive or obstetric history, first trimester: Secondary | ICD-10-CM

## 2018-10-07 DIAGNOSIS — Z8759 Personal history of other complications of pregnancy, childbirth and the puerperium: Secondary | ICD-10-CM | POA: Insufficient documentation

## 2018-10-07 DIAGNOSIS — Z3689 Encounter for other specified antenatal screening: Secondary | ICD-10-CM

## 2018-10-07 DIAGNOSIS — F32A Depression, unspecified: Secondary | ICD-10-CM

## 2018-10-07 DIAGNOSIS — O99341 Other mental disorders complicating pregnancy, first trimester: Secondary | ICD-10-CM

## 2018-10-07 DIAGNOSIS — O10919 Unspecified pre-existing hypertension complicating pregnancy, unspecified trimester: Secondary | ICD-10-CM

## 2018-10-07 DIAGNOSIS — O99210 Obesity complicating pregnancy, unspecified trimester: Secondary | ICD-10-CM

## 2018-10-07 DIAGNOSIS — O10911 Unspecified pre-existing hypertension complicating pregnancy, first trimester: Secondary | ICD-10-CM

## 2018-10-07 DIAGNOSIS — O0990 Supervision of high risk pregnancy, unspecified, unspecified trimester: Secondary | ICD-10-CM

## 2018-10-07 DIAGNOSIS — O99211 Obesity complicating pregnancy, first trimester: Secondary | ICD-10-CM

## 2018-10-07 LAB — POCT URINALYSIS DIPSTICK OB
GLUCOSE, UA: NEGATIVE
POC,PROTEIN,UA: NEGATIVE

## 2018-10-07 LAB — BETA HCG QUANT (REF LAB): hCG Quant: 103365 m[IU]/mL

## 2018-10-07 MED ORDER — PROGESTERONE 4 % VA GEL: 1.0000 | Freq: Every day | VAGINAL | 2 refills | Status: DC

## 2018-10-07 NOTE — Progress Notes (Signed)
ROB

## 2018-10-07 NOTE — Progress Notes (Signed)
Routine Prenatal Care Visit  Subjective  Tiffany Chapman is a 38 y.o. G5P1031 at [redacted]w[redacted]d being seen today for ongoing prenatal care.  She is currently monitored for the following issues for this high-risk pregnancy and has Supervision of high risk pregnancy, antepartum; BMI 40.0-44.9, adult (HCC); Chronic hypertension affecting pregnancy; and Anxiety and depression on their problem list.  ----------------------------------------------------------------------------------- Patient reports no complaints.    .  .   . Denies leaking of fluid.  ----------------------------------------------------------------------------------- The following portions of the patient's history were reviewed and updated as appropriate: allergies, current medications, past family history, past medical history, past social history, past surgical history and problem list. Problem list updated.   Objective  Blood pressure 130/80, weight 258 lb (117 kg), last menstrual period 08/12/2018, not currently breastfeeding. Pregravid weight 256 lb (116.1 kg) Total Weight Gain 2 lb (0.907 kg) Urinalysis:      Fetal Status:           General:  Alert, oriented and cooperative. Patient is in no acute distress.  Skin: Skin is warm and dry. No rash noted.   Cardiovascular: Normal heart rate noted  Respiratory: Normal respiratory effort, no problems with respiration noted  Abdomen: Soft, gravid, appropriate for gestational age.       Pelvic:  Cervical exam deferred        Extremities: Normal range of motion.     ental Status: Normal mood and affect. Normal behavior. Normal judgment and thought content.     Assessment   38 y.o. G1W2993 at [redacted]w[redacted]d by  05/19/2019, by Last Menstrual Period presenting for routine prenatal visit  Plan   Pregnancy#5 Problems (from 08/12/18 to present)    Problem Noted Resolved   Supervision of high risk pregnancy, antepartum 01/14/2017 by Trellis Moment, CMA No   Priority:  High     Overview Addendum  10/07/2018  1:34 PM by Vena Austria, MD     Clinic [facility] Prenatal Labs  Dating  Blood type: O negative  Genetic Screen NIPS:  Antibody: Negative  Anatomic [place]  Rubella: Immune Varicella: Immune  GTT  RPR: Immune  Rhogam [ ]  28 weeks HBsAg: negative  TDaP vaccine [ ]  30 weeks   HIV: negative  Baby Food Bottle                 GBS:   Contraception POPs Pap: 01/14/17 NIL HPV negative          Support Person Husband: Greig Castilla History of polyhydramnios last preganacy    HIgh risk for obesity with BMI=40, CHTN, habitual abortions, AMA, and anxiety/depression        History of polyhydramnios 10/07/2018 by Vena Austria, MD No   Maternal obesity, antepartum 01/18/2017 by Farrel Conners, CNM No   Overview Addendum 10/07/2018  1:35 PM by Vena Austria, MD    BMI >=40 []  bASA (>12 weeks) [ ]  Growth u/s 28 [ ] , 32 [ ] , 36 weeks [ ]  [ ]  NST/AFI weekly 36+ weeks (36[] , 37[] , 38[] , 39[] , 40[] ) [ ]  IOL by 41 weeks (scheduled, prn [] )        Chronic hypertension affecting pregnancy 01/18/2017 by Farrel Conners, CNM No   Overview Addendum 08/14/2017  4:25 PM by Natale Milch, MD    Monitor blood pressures. CMP and TSH WNL. Baseline PC ratio 98 mgm  [X]  Aspirin 81 mg daily after 12 weeks; discontinue after 36 weeks [X]  baseline labs with CBC, CMP, urine protein/creatinine ratio [ ]  no BP meds  unless BPs become elevated [ ]  ultrasound for growth at [X]  28 ordered, [x]  32, 36 [ ]  weeks   Current antihypertensives:  None   Baseline and surveillance labs [facility], refresh links as needed)  Lab Results  Component Value Date   PLT 283 01/23/2017   CREATININE 0.65 01/23/2017   AST 11 01/23/2017   ALT 7 01/23/2017    Antenatal Testing CHTN - O10.919  Group I  BP < 140/90, no preeclampsia, AGA,  nml AFV, +/- meds    Group II BP > 140/90, on meds, no preeclampsia, AGA, nml AFV  20-28-34-38  20-24-28-32-35-38  32//2 x wk  28//BPP wkly then 32//2 x wk  40  no meds; 39 meds  PRN or 437             Gestational age appropriate obstetric precautions including but not limited to vaginal bleeding, contractions, leaking of fluid and fetal movement were reviewed in detail with the patient.    - Bedside US today 8 week IUP with +FHT  Return in about 1 week (around 10/14/2018) for ROB and dating scan (Colby Catanese).  Vena AustriaAndreas Davontae Prusinski, MD, Evern CoreFACOG [facility], [facility] 10/07/2018, 1:32 PM

## 2018-10-18 ENCOUNTER — Ambulatory Visit (INDEPENDENT_AMBULATORY_CARE_PROVIDER_SITE_OTHER): Payer: BLUE CROSS/BLUE SHIELD | Admitting: Certified Nurse Midwife

## 2018-10-18 ENCOUNTER — Ambulatory Visit (INDEPENDENT_AMBULATORY_CARE_PROVIDER_SITE_OTHER): Payer: BLUE CROSS/BLUE SHIELD

## 2018-10-18 ENCOUNTER — Encounter: Payer: BLUE CROSS/BLUE SHIELD | Admitting: Obstetrics and Gynecology

## 2018-10-18 VITALS — BP 138/88 | Wt 256.0 lb

## 2018-10-18 DIAGNOSIS — N96 Recurrent pregnancy loss: Secondary | ICD-10-CM

## 2018-10-18 DIAGNOSIS — O10919 Unspecified pre-existing hypertension complicating pregnancy, unspecified trimester: Secondary | ICD-10-CM

## 2018-10-18 DIAGNOSIS — Z3A09 9 weeks gestation of pregnancy: Secondary | ICD-10-CM

## 2018-10-18 DIAGNOSIS — O0990 Supervision of high risk pregnancy, unspecified, unspecified trimester: Secondary | ICD-10-CM

## 2018-10-18 DIAGNOSIS — O10911 Unspecified pre-existing hypertension complicating pregnancy, first trimester: Secondary | ICD-10-CM

## 2018-10-18 DIAGNOSIS — Z3689 Encounter for other specified antenatal screening: Secondary | ICD-10-CM

## 2018-10-18 DIAGNOSIS — O99211 Obesity complicating pregnancy, first trimester: Secondary | ICD-10-CM

## 2018-10-18 DIAGNOSIS — O99210 Obesity complicating pregnancy, unspecified trimester: Secondary | ICD-10-CM

## 2018-10-18 MED ORDER — DOXYLAMINE-PYRIDOXINE 10-10 MG PO TBEC: 2.0000 | DELAYED_RELEASE_TABLET | Freq: Every day | ORAL | 5 refills | Status: DC

## 2018-10-18 NOTE — Progress Notes (Signed)
ROB Nausea Ultrasound

## 2018-10-21 NOTE — Progress Notes (Signed)
ROB/ Viability scan at 9wk4d: CRL 9wk1d, FCA 166. Has been having frequent ultrasounds due to hx of recurrent SABs. No vaginal bleeding. Increasing nausea but no vomiting: RX for Diclegis sent to pharmacy. Discussed how to take medication CHTN: BP 138/88, off labetalol, needs CMP and PC ratio for baseline Anxiety: taking Lexapro AMA-desires MaterniT 21 Obesity: needs early 1 hour GTT  ROB/ viability scan/ labs in 2 weeks  Farrel Conners, CNM

## 2018-11-01 ENCOUNTER — Ambulatory Visit (INDEPENDENT_AMBULATORY_CARE_PROVIDER_SITE_OTHER): Payer: BLUE CROSS/BLUE SHIELD

## 2018-11-01 ENCOUNTER — Ambulatory Visit (INDEPENDENT_AMBULATORY_CARE_PROVIDER_SITE_OTHER): Payer: BLUE CROSS/BLUE SHIELD | Admitting: Obstetrics and Gynecology

## 2018-11-01 VITALS — BP 140/80 | Wt 258.0 lb

## 2018-11-01 DIAGNOSIS — Z6841 Body Mass Index (BMI) 40.0 and over, adult: Secondary | ICD-10-CM

## 2018-11-01 DIAGNOSIS — Z3A11 11 weeks gestation of pregnancy: Secondary | ICD-10-CM

## 2018-11-01 DIAGNOSIS — O99210 Obesity complicating pregnancy, unspecified trimester: Secondary | ICD-10-CM

## 2018-11-01 DIAGNOSIS — O09529 Supervision of elderly multigravida, unspecified trimester: Secondary | ICD-10-CM

## 2018-11-01 DIAGNOSIS — O0990 Supervision of high risk pregnancy, unspecified, unspecified trimester: Secondary | ICD-10-CM

## 2018-11-01 DIAGNOSIS — O10911 Unspecified pre-existing hypertension complicating pregnancy, first trimester: Secondary | ICD-10-CM

## 2018-11-01 DIAGNOSIS — N96 Recurrent pregnancy loss: Secondary | ICD-10-CM

## 2018-11-01 DIAGNOSIS — O09521 Supervision of elderly multigravida, first trimester: Secondary | ICD-10-CM

## 2018-11-01 DIAGNOSIS — Z8759 Personal history of other complications of pregnancy, childbirth and the puerperium: Secondary | ICD-10-CM

## 2018-11-01 DIAGNOSIS — O99211 Obesity complicating pregnancy, first trimester: Secondary | ICD-10-CM

## 2018-11-01 DIAGNOSIS — O3680X0 Pregnancy with inconclusive fetal viability, not applicable or unspecified: Secondary | ICD-10-CM | POA: Diagnosis not present

## 2018-11-01 DIAGNOSIS — Z1379 Encounter for other screening for genetic and chromosomal anomalies: Secondary | ICD-10-CM

## 2018-11-01 DIAGNOSIS — O10919 Unspecified pre-existing hypertension complicating pregnancy, unspecified trimester: Secondary | ICD-10-CM

## 2018-11-01 DIAGNOSIS — Z31438 Encounter for other genetic testing of female for procreative management: Secondary | ICD-10-CM

## 2018-11-01 DIAGNOSIS — O09291 Supervision of pregnancy with other poor reproductive or obstetric history, first trimester: Secondary | ICD-10-CM

## 2018-11-01 LAB — POCT URINALYSIS DIPSTICK OB
Glucose, UA: NEGATIVE
PROTEIN: NEGATIVE

## 2018-11-01 NOTE — Addendum Note (Signed)
Addended by: [place], Yossef Gilkison B on: 11/01/2018 03:33 PM   Modules accepted: Orders

## 2018-11-01 NOTE — Progress Notes (Signed)
ROB and Vialibity u/s- no concerns

## 2018-11-01 NOTE — Progress Notes (Signed)
Routine Prenatal Care Visit  Subjective  Tiffany Chapman is a 38 y.o. G5P1031 at [redacted]w[redacted]d being seen today for ongoing prenatal care.  She is currently monitored for the following issues for this high-risk pregnancy and has Supervision of high risk pregnancy, antepartum; Maternal obesity, antepartum; BMI 40.0-44.9, adult (HCC); Chronic hypertension affecting pregnancy; Anxiety and depression; and History of polyhydramnios on their problem list.  ----------------------------------------------------------------------------------- Patient reports no complaints.  Nausea significantly improved. Contractions: Not present. Vag. Bleeding: None.  Movement: Absent. Denies leaking of fluid.  ----------------------------------------------------------------------------------- The following portions of the patient's history were reviewed and updated as appropriate: allergies, current medications, past family history, past medical history, past social history, past surgical history and problem list. Problem list updated.   Objective  Blood pressure 140/80, weight 258 lb (117 kg), last menstrual period 08/12/2018, not currently breastfeeding. Pregravid weight 256 lb (116.1 kg) Total Weight Gain 2 lb (0.907 kg) Urinalysis:      Fetal Status: Fetal Heart Rate (bpm): 156   Movement: Absent     General:  Alert, oriented and cooperative. Patient is in no acute distress.  Skin: Skin is warm and dry. No rash noted.   Cardiovascular: Normal heart rate noted  Respiratory: Normal respiratory effort, no problems with respiration noted  Abdomen: Soft, gravid, appropriate for gestational age. Pain/Pressure: Absent     Pelvic:  Cervical exam deferred        Extremities: Normal range of motion.     ental Status: Normal mood and affect. Normal behavior. Normal judgment and thought content.   US Ob Comp Less 14 Wks  Result Date: 11/01/2018 ULTRASOUND REPORT Location: [facility] Date of Service: 11/01/2018 Patient  Name: Tiffany Chapman DOB: [birth year] MRN: [id number] Indications:VIABILITY Findings: [name] intrauterine pregnancy is visualized with a CRL consistent with [redacted]w[redacted]d gestation, giving an (U/S) EDD of 05/18/2019. The (U/S) EDD is consistent with the clinically established EDD of 05/19/2019. FHR: 156 BPM CRL measurement: 50.1 mm Yolk sac is visualized and appears normal and early anatomy is normal. Amnion: visualized and appears normal Survey of the adnexa demonstrates no adnexal masses. There is no free peritoneal fluid in the cul de sac. Impression: 1. [redacted]w[redacted]d Viable Singleton Intrauterine pregnancy by U/S. 2. (U/S) EDD is consistent with Clinically established EDD of 05/19/2019. Recommendations: 1.Clinical correlation with the patient's History and Physical Exam. Mital bahen P Patel, RDMS There is a viable singleton gestation.  The fetal biometry correlates with established dating. Detailed evaluation of the fetal anatomy is precluded by early gestational age.  It must be noted that a normal ultrasound particular at this early gestational age is unable to rule out fetal aneuploidy, risk of first trimester miscarriage, or anatomic birth defects. Vena [place], MD, [facility], [facility] 11/01/2018, 2:45 PM   US Ob Comp Less 14 Wks  Result Date: 10/18/2018 Patient Name: Tiffany Chapman DOB: 24-Jul-1981 MRN: [id number] ULTRASOUND REPORT Location: [facility] Date of Service: 10/18/2018 Indications:dating Findings: [name] intrauterine pregnancy is visualized with a CRL consistent with [redacted]w[redacted]d gestation, giving an (U/S) EDD of 05/22/19. The (U/S) EDD is consistent with the clinically established EDD of 05/19/19. FHR: 166 BPM CRL measurement: 24.8 mm Yolk sac is visualized and appears normal and early anatomy is normal. Amnion: visualized and appears normal Right Ovary is normal in appearance. Left Ovary is normal appearance. Survey of the adnexa demonstrates no adnexal masses. There is no free  peritoneal fluid in the cul de sac. Impression: 1. [redacted]w[redacted]d Viable  Singleton Intrauterine pregnancy by U/S. 2. (U/S) EDD is consistent with Clinically established EDD of 05/19/19. Recommendations: 1.Clinical correlation with the patient's History and Physical Exam. Darlina Guys, RDMS RVT There is a viable singleton gestation.  The fetal biometry correlates with established dating. Detailed evaluation of the fetal anatomy is precluded by early gestational age.  It must be noted that a normal ultrasound particular at this early gestational age is unable to rule out fetal aneuploidy, risk of first trimester miscarriage, or anatomic birth defects. Vena [place], MD, FACOG [facility], [facility] 10/18/2018, 5:24 PM     Assessment   37 y.o. Tiffany Chapman at [redacted]w[redacted]d by  05/19/2019, by Last Menstrual Period presenting for routine prenatal visit  Plan   Pregnancy#5 Problems (from 08/12/18 to present)    Problem Noted Resolved   Supervision of high risk pregnancy, antepartum 01/14/2017 by Trellis Moment, CMA No   Priority:  High     Overview Addendum 10/07/2018  1:34 PM by Vena [place], MD     Clinic [facility] Prenatal Labs  Dating LMP = 9 week [place] Blood type: O negative  Genetic Screen NIPS:  Antibody: Negative  Anatomic [place]  Rubella: Immune Varicella: Immune  GTT  RPR: Immune  Rhogam  28 weeks HBsAg: negative  TDaP vaccine  30 weeks   HIV: negative  Baby Food Bottle                 GBS:   Contraception POPs Pap: 01/14/17 NIL HPV negative          Support Person Husband: Greig Castilla History of polyhydramnios last preganacy    HIgh risk for obesity with BMI=40, CHTN, habitual abortions, AMA, and anxiety/depression        History of polyhydramnios 10/07/2018 by Vena [place], MD No   Maternal obesity, antepartum 01/18/2017 by Farrel Conners, CNM No   Overview Addendum 10/07/2018  1:35 PM by Vena [place], MD    BMI >=40  bASA (>12 weeks)  Growth u/s 28 , 32 , 36  weeks   NST/AFI weekly 36+ weeks (36[] , 37[] , 38[] , 39[] , 40[] )  IOL by 41 weeks (scheduled, prn )        Chronic hypertension affecting pregnancy 01/18/2017 by Farrel Conners, CNM No   Overview Addendum 08/14/2017  4:25 PM by Natale Milch, MD    Monitor blood pressures. CMP and TSH WNL. Baseline PC ratio 98 mgm   Aspirin 81 mg daily after 12 weeks; discontinue after 36 weeks  baseline labs with CBC, CMP, urine protein/creatinine ratio  no BP meds unless BPs become elevated  ultrasound for growth at  28 ordered,  32, 36  weeks   Current antihypertensives:  None   Baseline and surveillance labs (pulled in from [facility], refresh links as needed)  Lab Results  Component Value Date   PLT 283 01/23/2017   CREATININE 0.65 01/23/2017   AST 11 01/23/2017   ALT 7 01/23/2017    Antenatal Testing CHTN - O10.919  Group I  BP < 140/90, no preeclampsia, AGA,  nml AFV, +/- meds    Group II BP > 140/90, on meds, no preeclampsia, AGA, nml AFV  20-28-34-38  20-24-28-32-35-38  32//2 x wk  28//BPP wkly then 32//2 x wk  40 no meds; 39 meds  PRN or 60             Gestational age appropriate obstetric precautions including but not limited  to vaginal bleeding, contractions, leaking of fluid and fetal movement were reviewed in detail with the patient.    - genetic today - CMP and P/C ratio today - start ASA next week  Return in about 1 week (around 11/08/2018) for ROB Daleisa Halperin.  Vena [place], MD, [facility], [facility] 11/01/2018, 2:58 PM

## 2018-11-02 LAB — COMPREHENSIVE METABOLIC PANEL
A/G RATIO: 1.5 (ref 1.2–2.2)
ALT: 8 IU/L (ref 0–32)
AST: 9 IU/L (ref 0–40)
Albumin: 4.1 g/dL (ref 3.8–4.8)
BUN/Creatinine Ratio: 13 (ref 9–23)
BUN: 7 mg/dL (ref 6–20)
Bilirubin Total: <0.2 mg/dL (ref 0.0–1.2)
Creatinine, Ser: 0.52 mg/dL — ABNORMAL LOW (ref 0.57–1.00)
GFR, EST AFRICAN AMERICAN: 141 mL/min/{1.73_m2} (ref >59)
GFR, EST NON AFRICAN AMERICAN: 122 mL/min/{1.73_m2} (ref >59)
GLOBULIN, TOTAL: 2.7 g/dL (ref 1.5–4.5)
Glucose: 87 mg/dL (ref 65–99)
TOTAL PROTEIN: 6.8 g/dL (ref 6.0–8.5)

## 2018-11-02 LAB — PROTEIN / CREATININE RATIO, URINE
Creatinine, Urine: 46.6 mg/dL
Protein, Ur: 4.6 mg/dL
Protein/Creat Ratio: 99 mg/g creat (ref 0–200)

## 2018-11-06 LAB — MATERNIT 21 PLUS CORE, BLOOD
CHROMOSOME 18: NEGATIVE
Chromosome 13: NEGATIVE
Chromosome 21: NEGATIVE
Y Chromosome: NOT DETECTED

## 2018-11-08 ENCOUNTER — Ambulatory Visit (INDEPENDENT_AMBULATORY_CARE_PROVIDER_SITE_OTHER): Payer: BLUE CROSS/BLUE SHIELD | Admitting: Obstetrics and Gynecology

## 2018-11-08 VITALS — BP 140/92 | Wt 260.0 lb

## 2018-11-08 DIAGNOSIS — O10911 Unspecified pre-existing hypertension complicating pregnancy, first trimester: Secondary | ICD-10-CM

## 2018-11-08 DIAGNOSIS — O99211 Obesity complicating pregnancy, first trimester: Secondary | ICD-10-CM

## 2018-11-08 DIAGNOSIS — O0990 Supervision of high risk pregnancy, unspecified, unspecified trimester: Secondary | ICD-10-CM

## 2018-11-08 DIAGNOSIS — O09291 Supervision of pregnancy with other poor reproductive or obstetric history, first trimester: Secondary | ICD-10-CM

## 2018-11-08 DIAGNOSIS — O99210 Obesity complicating pregnancy, unspecified trimester: Secondary | ICD-10-CM

## 2018-11-08 DIAGNOSIS — Z8759 Personal history of other complications of pregnancy, childbirth and the puerperium: Secondary | ICD-10-CM

## 2018-11-08 DIAGNOSIS — O10919 Unspecified pre-existing hypertension complicating pregnancy, unspecified trimester: Secondary | ICD-10-CM

## 2018-11-08 DIAGNOSIS — Z3A12 12 weeks gestation of pregnancy: Secondary | ICD-10-CM

## 2018-11-08 LAB — POCT URINALYSIS DIPSTICK OB
Glucose, UA: NEGATIVE
POC,PROTEIN,UA: NEGATIVE

## 2018-11-08 NOTE — Progress Notes (Signed)
ROB

## 2018-11-09 NOTE — Progress Notes (Signed)
Routine Prenatal Care Visit  Subjective  Tiffany Chapman is a 38 y.o. G5P1031 at [redacted]w[redacted]d being seen today for ongoing prenatal care.  She is currently monitored for the following issues for this high-risk pregnancy and has Supervision of high risk pregnancy, antepartum; Maternal obesity, antepartum; BMI 40.0-44.9, adult (HCC); Chronic hypertension affecting pregnancy; Anxiety and depression; and History of polyhydramnios on their problem list.  ----------------------------------------------------------------------------------- Patient reports no complaints.   Contractions: Not present. Vag. Bleeding: None.  Movement: Absent. Denies leaking of fluid.  ----------------------------------------------------------------------------------- The following portions of the patient's history were reviewed and updated as appropriate: allergies, current medications, past family history, past medical history, past social history, past surgical history and problem list. Problem list updated.   Objective  Blood pressure (!) 140/92, weight 260 lb (117.9 kg), last menstrual period 08/12/2018, not currently breastfeeding. Pregravid weight 256 lb (116.1 kg) Total Weight Gain 4 lb (1.814 kg) Urinalysis:      Fetal Status: Fetal Heart Rate (bpm): 150   Movement: Absent     General:  Alert, oriented and cooperative. Patient is in no acute distress.  Skin: Skin is warm and dry. No rash noted.   Cardiovascular: Normal heart rate noted  Respiratory: Normal respiratory effort, no problems with respiration noted  Abdomen: Soft, gravid, appropriate for gestational age. Pain/Pressure: Absent     Pelvic:  Cervical exam deferred        Extremities: Normal range of motion.     ental Status: Normal mood and affect. Normal behavior. Normal judgment and thought content.     Assessment   38 y.o. [id number] at [redacted]w[redacted]d by  05/19/2019, by Last Menstrual Period presenting for routine prenatal visit  Plan   Pregnancy#5  Problems (from 08/12/18 to present)    Problem Noted Resolved   Supervision of high risk pregnancy, antepartum 01/14/2017 by Trellis Moment, CMA No   Priority:  High     Overview Addendum 10/07/2018  1:34 PM by Vena Austria, MD     Clinic [facility] Prenatal Labs  Dating  Blood type: O negative  Genetic Screen NIPS:  Antibody: Negative  Anatomic [place]  Rubella: Immune Varicella: Immune  GTT  RPR: Immune  Rhogam [ ]  28 weeks HBsAg: negative  TDaP vaccine [ ]  30 weeks   HIV: negative  Baby Food Bottle                 GBS:   Contraception POPs Pap: 01/14/17 NIL HPV negative          Support Person Husband: Greig Castilla History of polyhydramnios last preganacy    HIgh risk for obesity with BMI=40, CHTN, habitual abortions, AMA, and anxiety/depression        History of polyhydramnios 10/07/2018 by Vena Austria, MD No   Maternal obesity, antepartum 01/18/2017 by Farrel Conners, CNM No   Overview Addendum 10/07/2018  1:35 PM by Vena Austria, MD    BMI >=40 []  bASA (>12 weeks) [ ]  Growth u/s 28 [ ] , 32 [ ] , 36 weeks [ ]  [ ]  NST/AFI weekly 36+ weeks (36[] , 37[] , 38[] , 39[] , 40[] ) [ ]  IOL by 41 weeks (scheduled, prn [] )        Chronic hypertension affecting pregnancy 01/18/2017 by Farrel Conners, CNM No   Overview Addendum 08/14/2017  4:25 PM by Natale Milch, MD    Monitor blood pressures. CMP and TSH WNL. Baseline PC ratio 98 mgm  [X]  Aspirin 81 mg daily after 12 weeks; discontinue after 36 weeks [X]  baseline  labs with CBC, CMP, urine protein/creatinine ratio [ ]  no BP meds unless BPs become elevated [ ]  ultrasound for growth at [X]  28 ordered, [x]  32, 36 [ ]  weeks   Current antihypertensives:  None   Baseline and surveillance labs (pulled in from [facility], refresh links as needed)  Lab Results  Component Value Date   PLT 283 01/23/2017   CREATININE 0.65 01/23/2017   AST 11 01/23/2017   ALT 7 01/23/2017    Antenatal Testing CHTN - O10.919  Group I  BP < 140/90, no  preeclampsia, AGA,  nml AFV, +/- meds    Group II BP > 140/90, on meds, no preeclampsia, AGA, nml AFV  20-28-34-38  20-24-28-32-35-38  32//2 x wk  28//BPP wkly then 32//2 x wk  40 no meds; 39 meds  PRN or 34             Gestational age appropriate obstetric precautions including but not limited to vaginal bleeding, contractions, leaking of fluid and fetal movement were reviewed in detail with the patient.    - start ASA  Return in about 1 week (around 11/15/2018) for ROB.  Vena Austria, MD, [facility], [facility] 11/09/2018, 10:22 PM

## 2018-11-12 LAB — INHERITEST CORE(CF97,SMA,FRAX)

## 2018-11-15 ENCOUNTER — Ambulatory Visit (INDEPENDENT_AMBULATORY_CARE_PROVIDER_SITE_OTHER): Payer: BLUE CROSS/BLUE SHIELD | Admitting: Obstetrics and Gynecology

## 2018-11-15 VITALS — BP 144/82 | Wt 259.0 lb

## 2018-11-15 DIAGNOSIS — O99210 Obesity complicating pregnancy, unspecified trimester: Secondary | ICD-10-CM

## 2018-11-15 DIAGNOSIS — Z8759 Personal history of other complications of pregnancy, childbirth and the puerperium: Secondary | ICD-10-CM

## 2018-11-15 DIAGNOSIS — O0990 Supervision of high risk pregnancy, unspecified, unspecified trimester: Secondary | ICD-10-CM

## 2018-11-15 DIAGNOSIS — O99211 Obesity complicating pregnancy, first trimester: Secondary | ICD-10-CM

## 2018-11-15 DIAGNOSIS — Z3A13 13 weeks gestation of pregnancy: Secondary | ICD-10-CM

## 2018-11-15 DIAGNOSIS — O10919 Unspecified pre-existing hypertension complicating pregnancy, unspecified trimester: Secondary | ICD-10-CM

## 2018-11-15 DIAGNOSIS — O10911 Unspecified pre-existing hypertension complicating pregnancy, first trimester: Secondary | ICD-10-CM

## 2018-11-15 DIAGNOSIS — O09291 Supervision of pregnancy with other poor reproductive or obstetric history, first trimester: Secondary | ICD-10-CM

## 2018-11-15 LAB — POCT URINALYSIS DIPSTICK OB
Glucose, UA: NEGATIVE
PROTEIN: NEGATIVE

## 2018-11-15 MED ORDER — ASPIRIN EC 81 MG PO TBEC: 81.0000 mg | DELAYED_RELEASE_TABLET | Freq: Every day | ORAL | 2 refills | Status: DC

## 2018-11-15 NOTE — Progress Notes (Signed)
ROB

## 2018-11-15 NOTE — Progress Notes (Signed)
Routine Prenatal Care Visit  Subjective  Tiffany Chapman is a 38 y.o. G5P1031 at [redacted]w[redacted]d being seen today for ongoing prenatal care.  She is currently monitored for the following issues for this high-risk pregnancy and has Supervision of high risk pregnancy, antepartum; Maternal obesity, antepartum; BMI 40.0-44.9, adult (HCC); Chronic hypertension affecting pregnancy; Anxiety and depression; and History of polyhydramnios on their problem list.  ----------------------------------------------------------------------------------- Patient reports no complaints.   Contractions: Not present. Vag. Bleeding: None.  Movement: Absent. Denies leaking of fluid.  ----------------------------------------------------------------------------------- The following portions of the patient's history were reviewed and updated as appropriate: allergies, current medications, past family history, past medical history, past social history, past surgical history and problem list. Problem list updated.   Objective  Last menstrual period 08/12/2018, not currently breastfeeding. Pregravid weight 256 lb (116.1 kg) Total Weight Gain 3 lb (1.361 kg) Urinalysis:      Fetal Status: Fetal Heart Rate (bpm): 153   Movement: Absent     General:  Alert, oriented and cooperative. Patient is in no acute distress.  Skin: Skin is warm and dry. No rash noted.   Cardiovascular: Normal heart rate noted  Respiratory: Normal respiratory effort, no problems with respiration noted  Abdomen: Soft, gravid, appropriate for gestational age. Pain/Pressure: Absent     Pelvic:  Cervical exam deferred        Extremities: Normal range of motion.     ental Status: Normal mood and affect. Normal behavior. Normal judgment and thought content.   Immunization History  Administered Date(s) Administered  . Influenza,inj,Quad PF,6+ Mos 06/18/2017  . Tdap 07/02/2017    Assessment   37 y.o. [id number] at [redacted]w[redacted]d by  05/19/2019, by Last Menstrual Period  presenting for routine prenatal visit  Plan   Pregnancy#5 Problems (from 08/12/18 to present)    Problem Noted Resolved   Supervision of high risk pregnancy, antepartum 01/14/2017 by Trellis Moment, CMA No   Priority:  High     Overview Addendum 11/12/2018  2:39 PM by Vena Austria, MD     Clinic [facility] Prenatal Labs  Dating  Blood type: O negative  Genetic Screen NIPS: Normal XX, Inheritest negative Antibody: Negative  Anatomic [place]  Rubella: Immune Varicella: Immune  GTT  RPR: Immune  Rhogam [ ]  28 weeks HBsAg: negative  TDaP vaccine [ ]  30 weeks   HIV: negative  Baby Food Bottle                 GBS:   Contraception POPs Pap: 01/14/17 NIL HPV negative          Support Person Husband: Greig Castilla History of polyhydramnios last preganacy    HIgh risk for obesity with BMI=40, CHTN, habitual abortions, AMA, and anxiety/depression        History of polyhydramnios 10/07/2018 by Vena Austria, MD No   Maternal obesity, antepartum 01/18/2017 by Farrel Conners, CNM No   Overview Addendum 10/07/2018  1:35 PM by Vena Austria, MD    BMI >=40 []  bASA (>12 weeks) [ ]  Growth u/s 28 [ ] , 32 [ ] , 36 weeks [ ]  [ ]  NST/AFI weekly 36+ weeks (36[] , 37[] , 38[] , 39[] , 40[] ) [ ]  IOL by 41 weeks (scheduled, prn [] )        Chronic hypertension affecting pregnancy 01/18/2017 by Farrel Conners, CNM No   Overview Addendum 08/14/2017  4:25 PM by Natale Milch, MD    Monitor blood pressures. CMP and TSH WNL. Baseline PC ratio 98 mgm  [X]  Aspirin 81  mg daily after 12 weeks; discontinue after 36 weeks [X]  baseline labs with CBC, CMP, urine protein/creatinine ratio [ ]  no BP meds unless BPs become elevated [ ]  ultrasound for growth at [X]  28 ordered, [x]  32, 36 [ ]  weeks   Current antihypertensives:  None   Baseline and surveillance labs (pulled in from [facility], refresh links as needed)  Lab Results  Component Value Date   PLT 283 01/23/2017   CREATININE 0.65 01/23/2017   AST 11  01/23/2017   ALT 7 01/23/2017    Antenatal Testing CHTN - O10.919  Group I  BP < 140/90, no preeclampsia, AGA,  nml AFV, +/- meds    Group II BP > 140/90, on meds, no preeclampsia, AGA, nml AFV  20-28-34-38  20-24-28-32-35-38  32//2 x wk  28//BPP wkly then 32//2 x wk  40 no meds; 39 meds  PRN or 32             Gestational age appropriate obstetric precautions including but not limited to vaginal bleeding, contractions, leaking of fluid and fetal movement were reviewed in detail with the patient.    Return in about 1 week (around 11/22/2018) for ROB and early 1-hr glucola.  Vena Austria, MD, [facility], [facility] 11/15/2018, 3:40 PM

## 2018-11-23 ENCOUNTER — Other Ambulatory Visit: Payer: Self-pay

## 2018-11-23 ENCOUNTER — Other Ambulatory Visit: Payer: Self-pay | Admitting: Certified Nurse Midwife

## 2018-11-23 ENCOUNTER — Other Ambulatory Visit: Payer: BLUE CROSS/BLUE SHIELD

## 2018-11-23 ENCOUNTER — Ambulatory Visit (INDEPENDENT_AMBULATORY_CARE_PROVIDER_SITE_OTHER): Payer: BLUE CROSS/BLUE SHIELD | Admitting: Obstetrics and Gynecology

## 2018-11-23 VITALS — BP 138/74 | Wt 261.0 lb

## 2018-11-23 DIAGNOSIS — O99342 Other mental disorders complicating pregnancy, second trimester: Secondary | ICD-10-CM

## 2018-11-23 DIAGNOSIS — O99210 Obesity complicating pregnancy, unspecified trimester: Secondary | ICD-10-CM

## 2018-11-23 DIAGNOSIS — O0990 Supervision of high risk pregnancy, unspecified, unspecified trimester: Secondary | ICD-10-CM

## 2018-11-23 DIAGNOSIS — O10919 Unspecified pre-existing hypertension complicating pregnancy, unspecified trimester: Secondary | ICD-10-CM

## 2018-11-23 DIAGNOSIS — O99212 Obesity complicating pregnancy, second trimester: Secondary | ICD-10-CM

## 2018-11-23 DIAGNOSIS — O10912 Unspecified pre-existing hypertension complicating pregnancy, second trimester: Secondary | ICD-10-CM

## 2018-11-23 DIAGNOSIS — O09292 Supervision of pregnancy with other poor reproductive or obstetric history, second trimester: Secondary | ICD-10-CM

## 2018-11-23 DIAGNOSIS — Z8759 Personal history of other complications of pregnancy, childbirth and the puerperium: Secondary | ICD-10-CM

## 2018-11-23 DIAGNOSIS — Z6841 Body Mass Index (BMI) 40.0 and over, adult: Secondary | ICD-10-CM

## 2018-11-23 DIAGNOSIS — F32A Depression, unspecified: Secondary | ICD-10-CM

## 2018-11-23 DIAGNOSIS — F329 Major depressive disorder, single episode, unspecified: Secondary | ICD-10-CM

## 2018-11-23 DIAGNOSIS — Z3A14 14 weeks gestation of pregnancy: Secondary | ICD-10-CM

## 2018-11-23 DIAGNOSIS — F419 Anxiety disorder, unspecified: Secondary | ICD-10-CM

## 2018-11-23 LAB — POCT URINALYSIS DIPSTICK OB
GLUCOSE, UA: NEGATIVE
POC,PROTEIN,UA: NEGATIVE

## 2018-11-23 NOTE — Progress Notes (Signed)
ROB Early GTT 

## 2018-11-24 LAB — COMPREHENSIVE METABOLIC PANEL
ALBUMIN: 3.6 g/dL — AB (ref 3.8–4.8)
ALT: 10 IU/L (ref 0–32)
AST: 11 IU/L (ref 0–40)
Albumin/Globulin Ratio: 1.6 (ref 1.2–2.2)
Alkaline Phosphatase: 57 IU/L (ref 39–117)
BUN/Creatinine Ratio: 12 (ref 9–23)
BUN: 6 mg/dL (ref 6–20)
Bilirubin Total: <0.2 mg/dL (ref 0.0–1.2)
CALCIUM: 9 mg/dL (ref 8.7–10.2)
CHLORIDE: 102 mmol/L (ref 96–106)
CO2: 19 mmol/L — AB (ref 20–29)
CREATININE: 0.5 mg/dL — AB (ref 0.57–1.00)
GFR calc Af Amer: 143 mL/min/{1.73_m2} (ref >59)
GFR calc non Af Amer: 124 mL/min/{1.73_m2} (ref >59)
GLUCOSE: 131 mg/dL — AB (ref 65–99)
Globulin, Total: 2.3 g/dL (ref 1.5–4.5)
Potassium: 3.8 mmol/L (ref 3.5–5.2)
Sodium: 137 mmol/L (ref 134–144)
Total Protein: 5.9 g/dL — ABNORMAL LOW (ref 6.0–8.5)

## 2018-11-24 LAB — GLUCOSE, 1 HOUR GESTATIONAL: Gestational Diabetes Screen: 116 mg/dL (ref 65–139)

## 2018-11-24 NOTE — Progress Notes (Signed)
Routine Prenatal Care Visit  Subjective  Tiffany Chapman is a 38 y.o. G5P1031 at [redacted]w[redacted]d being seen today for ongoing prenatal care.  She is currently monitored for the following issues for this high-risk pregnancy and has Supervision of high risk pregnancy, antepartum; Maternal obesity, antepartum; BMI 40.0-44.9, adult (HCC); Chronic hypertension affecting pregnancy; Anxiety and depression; and History of polyhydramnios on their problem list.  ----------------------------------------------------------------------------------- Patient reports no complaints.   Contractions: Not present. Vag. Bleeding: None.  Movement: Absent. Denies leaking of fluid.  ----------------------------------------------------------------------------------- The following portions of the patient's history were reviewed and updated as appropriate: allergies, current medications, past family history, past medical history, past social history, past surgical history and problem list. Problem list updated.   Objective  Blood pressure 138/74, weight 261 lb (118.4 kg), last menstrual period 08/12/2018, not currently breastfeeding. Pregravid weight 256 lb (116.1 kg) Total Weight Gain 5 lb (2.268 kg) Urinalysis:      Fetal Status: Fetal Heart Rate (bpm): 145   Movement: Absent     General:  Alert, oriented and cooperative. Patient is in no acute distress.  Skin: Skin is warm and dry. No rash noted.   Cardiovascular: Normal heart rate noted  Respiratory: Normal respiratory effort, no problems with respiration noted  Abdomen: Soft, gravid, appropriate for gestational age. Pain/Pressure: Absent     Pelvic:  Cervical exam deferred        Extremities: Normal range of motion.     ental Status: Normal mood and affect. Normal behavior. Normal judgment and thought content.     Assessment   38 y.o. [id number] at [redacted]w[redacted]d by  05/19/2019, by Last Menstrual Period presenting for routine prenatal visit  Plan   Pregnancy#5 Problems  (from 08/12/18 to present)    Problem Noted Resolved   Supervision of high risk pregnancy, antepartum 01/14/2017 by Trellis Moment, CMA No   Priority:  High     Overview Addendum 11/24/2018  8:28 AM by Farrel Conners, CNM     Clinic [facility] Prenatal Labs  Dating  Blood type: O negative  Genetic Screen NIPS: Normal XX, Inheritest negative Antibody: Negative  Anatomic [place]  Rubella: Immune Varicella: Immune  GTT 116-early; 28 wk ( ) RPR: Immune  Rhogam [ ]  28 weeks HBsAg: negative  TDaP vaccine [ ]  30 weeks   HIV: negative  Baby Food Bottle                 GBS:   Contraception POPs Pap: 01/14/17 NIL HPV negative  Influenza Declined 3/9       Support Person Husband: Greig Castilla History of polyhydramnios last preganacy    HIgh risk for obesity with BMI=40, CHTN, habitual abortions, AMA, and anxiety/depression        History of polyhydramnios 10/07/2018 by Vena Austria, MD No   Maternal obesity, antepartum 01/18/2017 by Farrel Conners, CNM No   Overview Addendum 10/07/2018  1:35 PM by Vena Austria, MD    BMI >=40 []  bASA (>12 weeks) [ ]  Growth u/s 28 [ ] , 32 [ ] , 36 weeks [ ]  [ ]  NST/AFI weekly 36+ weeks (36[] , 37[] , 38[] , 39[] , 40[] ) [ ]  IOL by 41 weeks (scheduled, prn [] )        Chronic hypertension affecting pregnancy 01/18/2017 by Farrel Conners, CNM No   Overview Addendum 08/14/2017  4:25 PM by Natale Milch, MD    Monitor blood pressures. CMP and TSH WNL. Baseline PC ratio 98 mgm  [X]  Aspirin 81 mg daily after 12  weeks; discontinue after 36 weeks [X]  baseline labs with CBC, CMP, urine protein/creatinine ratio [ ]  no BP meds unless BPs become elevated [ ]  ultrasound for growth at [X]  28 ordered, [x]  32, 36 [ ]  weeks   Current antihypertensives:  None   Baseline and surveillance labs (pulled in from [facility], refresh links as needed)  Lab Results  Component Value Date   PLT 283 01/23/2017   CREATININE 0.65 01/23/2017   AST 11 01/23/2017   ALT 7 01/23/2017     Antenatal Testing CHTN - O10.919  Group I  BP < 140/90, no preeclampsia, AGA,  nml AFV, +/- meds    Group II BP > 140/90, on meds, no preeclampsia, AGA, nml AFV  20-28-34-38  20-24-28-32-35-38  32//2 x wk  28//BPP wkly then 32//2 x wk  40 no meds; 39 meds  PRN or 63             Gestational age appropriate obstetric precautions including but not limited to vaginal bleeding, contractions, leaking of fluid and fetal movement were reviewed in detail with the patient.   - early 1-hr OGTT today  Return in about 1 week (around 11/30/2018) for ROB Shakur Lembo.  Vena Austria, MD, [facility], [facility]

## 2018-11-25 ENCOUNTER — Other Ambulatory Visit: Payer: Self-pay | Admitting: Obstetrics and Gynecology

## 2018-12-01 ENCOUNTER — Ambulatory Visit (INDEPENDENT_AMBULATORY_CARE_PROVIDER_SITE_OTHER): Payer: BLUE CROSS/BLUE SHIELD | Admitting: Obstetrics and Gynecology

## 2018-12-01 ENCOUNTER — Other Ambulatory Visit: Payer: Self-pay

## 2018-12-01 VITALS — BP 128/68 | Wt 259.0 lb

## 2018-12-01 DIAGNOSIS — Z3A15 15 weeks gestation of pregnancy: Secondary | ICD-10-CM

## 2018-12-01 DIAGNOSIS — O0990 Supervision of high risk pregnancy, unspecified, unspecified trimester: Secondary | ICD-10-CM

## 2018-12-01 DIAGNOSIS — O0992 Supervision of high risk pregnancy, unspecified, second trimester: Secondary | ICD-10-CM

## 2018-12-01 DIAGNOSIS — O10919 Unspecified pre-existing hypertension complicating pregnancy, unspecified trimester: Secondary | ICD-10-CM

## 2018-12-01 DIAGNOSIS — O99212 Obesity complicating pregnancy, second trimester: Secondary | ICD-10-CM

## 2018-12-01 DIAGNOSIS — O10912 Unspecified pre-existing hypertension complicating pregnancy, second trimester: Secondary | ICD-10-CM

## 2018-12-01 DIAGNOSIS — O99210 Obesity complicating pregnancy, unspecified trimester: Secondary | ICD-10-CM

## 2018-12-01 DIAGNOSIS — Z363 Encounter for antenatal screening for malformations: Secondary | ICD-10-CM

## 2018-12-01 NOTE — Progress Notes (Signed)
ROB

## 2018-12-01 NOTE — Progress Notes (Signed)
Routine Prenatal Care Visit  Subjective  Tiffany Chapman is a 38 y.o. G5P1031 at [redacted]w[redacted]d being seen today for ongoing prenatal care.  She is currently monitored for the following issues for this high-risk pregnancy and has Supervision of high risk pregnancy, antepartum; Maternal obesity, antepartum; BMI 40.0-44.9, adult (HCC); Chronic hypertension affecting pregnancy; Anxiety and depression; and History of polyhydramnios on their problem list.  ----------------------------------------------------------------------------------- Patient reports no complaints.   Contractions: Not present. Vag. Bleeding: None.  Movement: Absent. Denies leaking of fluid.  ----------------------------------------------------------------------------------- The following portions of the patient's history were reviewed and updated as appropriate: allergies, current medications, past family history, past medical history, past social history, past surgical history and problem list. Problem list updated.   Objective  Last menstrual period 08/12/2018, not currently breastfeeding. Pregravid weight 256 lb (116.1 kg) Total Weight Gain 3 lb (1.361 kg) Urinalysis:      Fetal Status: Fetal Heart Rate (bpm): 145   Movement: Absent     General:  Alert, oriented and cooperative. Patient is in no acute distress.  Skin: Skin is warm and dry. No rash noted.   Cardiovascular: Normal heart rate noted  Respiratory: Normal respiratory effort, no problems with respiration noted  Abdomen: Soft, gravid, appropriate for gestational age. Pain/Pressure: Absent     Pelvic:  Cervical exam deferred        Extremities: Normal range of motion.     ental Status: Normal mood and affect. Normal behavior. Normal judgment and thought content.     Assessment   38 y.o. [id number] at [redacted]w[redacted]d by  05/19/2019, by Last Menstrual Period presenting for routine prenatal visit  Plan   Pregnancy#5 Problems (from 08/12/18 to present)    Problem Noted  Resolved   Supervision of high risk pregnancy, antepartum 01/14/2017 by Trellis Moment, CMA No   Priority:  High     Overview Addendum 11/24/2018  8:28 AM by Farrel Conners, CNM     Clinic [facility] Prenatal Labs  Dating  Blood type: O negative  Genetic Screen NIPS: Normal XX, Inheritest negative Antibody: Negative  Anatomic [place]  Rubella: Immune Varicella: Immune  GTT 116-early; 28 wk ( ) RPR: Immune  Rhogam [ ]  28 weeks HBsAg: negative  TDaP vaccine [ ]  30 weeks   HIV: negative  Baby Food Bottle                 GBS:   Contraception POPs Pap: 01/14/17 NIL HPV negative  Influenza Declined 3/9       Support Person Husband: Greig Castilla History of polyhydramnios last preganacy    HIgh risk for obesity with BMI=40, CHTN, habitual abortions, AMA, and anxiety/depression        History of polyhydramnios 10/07/2018 by Vena Austria, MD No   Maternal obesity, antepartum 01/18/2017 by Farrel Conners, CNM No   Overview Addendum 10/07/2018  1:35 PM by Vena Austria, MD    BMI >=40 [X]  bASA (>12 weeks) [ ]  Growth u/s 28 [ ] , 32 [ ] , 36 weeks [ ]  [ ]  NST/AFI weekly 36+ weeks (36[] , 37[] , 38[] , 39[] , 40[] ) [ ]  IOL by 41 weeks (scheduled, prn [] )        Chronic hypertension affecting pregnancy 01/18/2017 by Farrel Conners, CNM No   Overview Addendum 08/14/2017  4:25 PM by Natale Milch, MD    Monitor blood pressures. CMP and TSH WNL. Baseline PC ratio 98 mgm  [X]  Aspirin 81 mg daily after 12 weeks; discontinue after 36 weeks [X]  baseline labs  with CBC, CMP, urine protein/creatinine ratio [ ]  no BP meds unless BPs become elevated [ ]  ultrasound for growth at [X]  28 ordered, [x]  32, 36 [ ]  weeks   Current antihypertensives:  None   Baseline and surveillance labs (pulled in from [facility], refresh links as needed)  Lab Results  Component Value Date   PLT 283 01/23/2017   CREATININE 0.65 01/23/2017   AST 11 01/23/2017   ALT 7 01/23/2017    Antenatal Testing CHTN - O10.919  Group  I  BP < 140/90, no preeclampsia, AGA,  nml AFV, +/- meds    Group II BP > 140/90, on meds, no preeclampsia, AGA, nml AFV  20-28-34-38  20-24-28-32-35-38  32//2 x wk  28//BPP wkly then 32//2 x wk  40 no meds; 39 meds  PRN or 77             Gestational age appropriate obstetric precautions including but not limited to vaginal bleeding, contractions, leaking of fluid and fetal movement were reviewed in detail with the patient.    Return in about 4 weeks (around 12/29/2018) for ROB and anatomy scan.  Vena Austria, MD, [facility], [facility] 12/01/2018, 11:48 AM

## 2018-12-27 ENCOUNTER — Ambulatory Visit (INDEPENDENT_AMBULATORY_CARE_PROVIDER_SITE_OTHER): Payer: BLUE CROSS/BLUE SHIELD | Admitting: Obstetrics and Gynecology

## 2018-12-27 ENCOUNTER — Ambulatory Visit (INDEPENDENT_AMBULATORY_CARE_PROVIDER_SITE_OTHER): Payer: BLUE CROSS/BLUE SHIELD

## 2018-12-27 ENCOUNTER — Other Ambulatory Visit: Payer: Self-pay

## 2018-12-27 VITALS — BP 128/84 | Wt 263.0 lb

## 2018-12-27 DIAGNOSIS — Z8759 Personal history of other complications of pregnancy, childbirth and the puerperium: Secondary | ICD-10-CM

## 2018-12-27 DIAGNOSIS — Z363 Encounter for antenatal screening for malformations: Secondary | ICD-10-CM | POA: Diagnosis not present

## 2018-12-27 DIAGNOSIS — O10919 Unspecified pre-existing hypertension complicating pregnancy, unspecified trimester: Secondary | ICD-10-CM

## 2018-12-27 DIAGNOSIS — O10912 Unspecified pre-existing hypertension complicating pregnancy, second trimester: Secondary | ICD-10-CM

## 2018-12-27 DIAGNOSIS — O99210 Obesity complicating pregnancy, unspecified trimester: Secondary | ICD-10-CM

## 2018-12-27 DIAGNOSIS — O09292 Supervision of pregnancy with other poor reproductive or obstetric history, second trimester: Secondary | ICD-10-CM

## 2018-12-27 DIAGNOSIS — Z3A19 19 weeks gestation of pregnancy: Secondary | ICD-10-CM

## 2018-12-27 DIAGNOSIS — O99212 Obesity complicating pregnancy, second trimester: Secondary | ICD-10-CM

## 2018-12-27 DIAGNOSIS — O0990 Supervision of high risk pregnancy, unspecified, unspecified trimester: Secondary | ICD-10-CM

## 2018-12-27 LAB — POCT URINALYSIS DIPSTICK OB
Glucose, UA: NEGATIVE
POC,PROTEIN,UA: NEGATIVE

## 2018-12-27 NOTE — Progress Notes (Signed)
ROB Anat scan/ It is a GIRL!!

## 2018-12-27 NOTE — Progress Notes (Signed)
Routine Prenatal Care Visit  Subjective  Tiffany Chapman is a 38 y.o. G5P1031 at 1220w4d being seen today for ongoing prenatal care.  She is currently monitored for the following issues for this low-risk pregnancy and has Supervision of high risk pregnancy, antepartum; Maternal obesity, antepartum; BMI 40.0-44.9, adult (HCC); Chronic hypertension affecting pregnancy; Anxiety and depression; and History of polyhydramnios on their problem list.  ----------------------------------------------------------------------------------- Patient reports no complaints.   Contractions: Not present. Vag. Bleeding: None.  Movement: Present. Denies leaking of fluid.  ----------------------------------------------------------------------------------- The following portions of the patient's history were reviewed and updated as appropriate: allergies, current medications, past family history, past medical history, past social history, past surgical history and problem list. Problem list updated.   Objective  Blood pressure 128/84, weight 263 lb (119.3 kg), last menstrual period 08/12/2018, not currently breastfeeding. Pregravid weight 256 lb (116.1 kg) Total Weight Gain 7 lb (3.175 kg) Urinalysis:      Fetal Status: Fetal Heart Rate (bpm): 140   Movement: Present     General:  Alert, oriented and cooperative. Patient is in no acute distress.  Skin: Skin is warm and dry. No rash noted.   Cardiovascular: Normal heart rate noted  Respiratory: Normal respiratory effort, no problems with respiration noted  Abdomen: Soft, gravid, appropriate for gestational age. Pain/Pressure: Absent     Pelvic:  Cervical exam deferred        Extremities: Normal range of motion.     ental Status: Normal mood and affect. Normal behavior. Normal judgment and thought content.   [place] Ob Comp + 14 Wk  Result Date: 12/27/2018 Patient Name: Tiffany Paddockshleigh V Schunk DOB: 09/28/1980 MRN: [id number] ULTRASOUND REPORT Location: [facility] Date of  Service: 12/27/2018 Indications:Anatomy Ultrasound Findings: Mason JimSingleton intrauterine pregnancy is visualized with FHR at 144 BPM. Biometrics give an (U/S) Gestational age of 541w6d and an (U/S) EDD of 05/17/2019; this correlates with the clinically established Estimated Date of Delivery: 05/19/19 Fetal presentation is Cephalic. EFW: 320 g (11 oz). Placenta: anterior. Grade: 1 AFI: subjectively normal. Anatomic survey is complete and normal; Gender - female.  Right Ovary is normal in appearance. Left Ovary is normal appearance. Survey of the adnexa demonstrates no adnexal masses. There is no free peritoneal fluid in the cul de sac. Impression: 1. 3620w4d Viable Singleton Intrauterine pregnancy by U/S. 2. (U/S) EDD is consistent with Clinically established Estimated Date of Delivery: 05/19/19 . 3. Normal Anatomy Scan Recommendations: 1.Clinical correlation with the patient's History and Physical Exam. Clydene LamingJenine M Alessi, RDMS  There is a singleton gestation with subjectively normal amniotic fluid volume. The fetal biometry correlates with established dating. Detailed evaluation of the fetal anatomy was performed.The fetal anatomical survey appears within normal limits within the resolution of ultrasound as described above.  It must be noted that a normal ultrasound is unable to rule out fetal aneuploidy.  Vena AustriaAndreas Kamran Coker, MD, FACOG [facility], [facility] 12/27/2018, 2:01 PM     Assessment   37 y.o. [id number] at 2620w4d by  05/19/2019, by Last Menstrual Period presenting for routine prenatal visit  Plan   Pregnancy#5 Problems (from 08/12/18 to present)    Problem Noted Resolved   Supervision of high risk pregnancy, antepartum 01/14/2017 by Trellis Momentroy, Hollie, CMA No   Priority:  High     Overview Addendum 11/24/2018  8:28 AM by Farrel ConnersGutierrez, Colleen, CNM     [facility] Prenatal Labs  Dating  Blood type: O negative  Genetic Screen NIPS: Normal XX, Inheritest negative  Antibody: Negative  Anatomic [place]   Rubella: Immune Varicella: Immune  GTT 116-early; 28 wk ( ) RPR: Immune  Rhogam [ ]  28 weeks HBsAg: negative  TDaP vaccine [ ]  30 weeks   HIV: negative  Baby Food Bottle                 GBS:   Contraception POPs Pap: 01/14/17 NIL HPV negative  Influenza Declined 3/9       Support Person Husband: Greig Castilla History of polyhydramnios last preganacy    HIgh risk for obesity with BMI=40, CHTN, habitual abortions, AMA, and anxiety/depression        History of polyhydramnios 10/07/2018 by Vena Austria, MD No   Maternal obesity, antepartum 01/18/2017 by Farrel Conners, CNM No   Overview Addendum 12/01/2018 11:36 AM by Vena Austria, MD    BMI >=40 [X]  bASA (>12 weeks) [ ]  Growth u/s 28 [ ] , 32 [ ] , 36 weeks [ ]  [ ]  NST/AFI weekly 36+ weeks (36[] , 37[] , 38[] , 39[] , 40[] ) [ ]  IOL by 41 weeks (scheduled, prn [] )        Chronic hypertension affecting pregnancy 01/18/2017 by Farrel Conners, CNM No   Overview Addendum 08/14/2017  4:25 PM by Natale Milch, MD    Monitor blood pressures. CMP and TSH WNL. Baseline PC ratio 98 mgm  [X]  Aspirin 81 mg daily after 12 weeks; discontinue after 36 weeks [X]  baseline labs with CBC, CMP, urine protein/creatinine ratio [ ]  no BP meds unless BPs become elevated [ ]  ultrasound for growth at [X]  28 ordered, [x]  32, 36 [ ]  weeks   Current antihypertensives:  None   Baseline and surveillance labs (pulled in from [facility], refresh links as needed)  Lab Results  Component Value Date   PLT 283 01/23/2017   CREATININE 0.65 01/23/2017   AST 11 01/23/2017   ALT 7 01/23/2017    Antenatal Testing CHTN - O10.919  Group I  BP < 140/90, no preeclampsia, AGA,  nml AFV, +/- meds    Group II BP > 140/90, on meds, no preeclampsia, AGA, nml AFV  20-28-34-38  20-24-28-32-35-38  32//2 x wk  28//BPP wkly then 32//2 x wk  40 no meds; 39 meds  PRN or 69             Gestational age appropriate obstetric precautions including but not  limited to vaginal bleeding, contractions, leaking of fluid and fetal movement were reviewed in detail with the patient.    Return in about 4 weeks (around 01/24/2019) for ROB.  Vena Austria, MD, Evern Core [facility], [facility] 12/27/2018, 2:14 PM

## 2019-01-21 ENCOUNTER — Ambulatory Visit (INDEPENDENT_AMBULATORY_CARE_PROVIDER_SITE_OTHER): Payer: BLUE CROSS/BLUE SHIELD | Admitting: Obstetrics and Gynecology

## 2019-01-21 ENCOUNTER — Other Ambulatory Visit: Payer: Self-pay

## 2019-01-21 VITALS — BP 136/90 | Wt 272.0 lb

## 2019-01-21 DIAGNOSIS — Z8759 Personal history of other complications of pregnancy, childbirth and the puerperium: Secondary | ICD-10-CM

## 2019-01-21 DIAGNOSIS — O10919 Unspecified pre-existing hypertension complicating pregnancy, unspecified trimester: Secondary | ICD-10-CM

## 2019-01-21 DIAGNOSIS — O99210 Obesity complicating pregnancy, unspecified trimester: Secondary | ICD-10-CM

## 2019-01-21 DIAGNOSIS — O99212 Obesity complicating pregnancy, second trimester: Secondary | ICD-10-CM

## 2019-01-21 DIAGNOSIS — O10912 Unspecified pre-existing hypertension complicating pregnancy, second trimester: Secondary | ICD-10-CM

## 2019-01-21 DIAGNOSIS — Z3A23 23 weeks gestation of pregnancy: Secondary | ICD-10-CM

## 2019-01-21 DIAGNOSIS — O09292 Supervision of pregnancy with other poor reproductive or obstetric history, second trimester: Secondary | ICD-10-CM

## 2019-01-21 DIAGNOSIS — O0990 Supervision of high risk pregnancy, unspecified, unspecified trimester: Secondary | ICD-10-CM

## 2019-01-21 LAB — POCT URINALYSIS DIPSTICK OB: Glucose, UA: NEGATIVE

## 2019-01-21 NOTE — Progress Notes (Signed)
ROB

## 2019-01-21 NOTE — Progress Notes (Signed)
Routine Prenatal Care Visit  Subjective  Tiffany Chapman is a 38 y.o. G5P1031 at [redacted]w[redacted]d being seen today for ongoing prenatal care.  She is currently monitored for the following issues for this high-risk pregnancy and has Supervision of high risk pregnancy, antepartum; Maternal obesity, antepartum; BMI 40.0-44.9, adult (HCC); Chronic hypertension affecting pregnancy; Anxiety and depression; and History of polyhydramnios on their problem list.  ----------------------------------------------------------------------------------- Patient reports no complaints.   Contractions: Not present. Vag. Bleeding: None.  Movement: Present. Denies leaking of fluid.  ----------------------------------------------------------------------------------- The following portions of the patient's history were reviewed and updated as appropriate: allergies, current medications, past family history, past medical history, past social history, past surgical history and problem list. Problem list updated.   Objective  Blood pressure 136/90, weight 272 lb (123.4 kg), last menstrual period 08/12/2018, not currently breastfeeding. Pregravid weight 256 lb (116.1 kg) Total Weight Gain 16 lb (7.258 kg) Urinalysis:      Fetal Status: Fetal Heart Rate (bpm): 145 Fundal Height: 26 cm Movement: Present     General:  Alert, oriented and cooperative. Patient is in no acute distress.  Skin: Skin is warm and dry. No rash noted.   Cardiovascular: Normal heart rate noted  Respiratory: Normal respiratory effort, no problems with respiration noted  Abdomen: Soft, gravid, appropriate for gestational age. Pain/Pressure: Present     Pelvic:  Cervical exam deferred        Extremities: Normal range of motion.     ental Status: Normal mood and affect. Normal behavior. Normal judgment and thought content.     Assessment   38 y.o. G5P1031 at [redacted]w[redacted]d by  05/19/2019, by Last Menstrual Period presenting for routine prenatal visit  Plan    Pregnancy#5 Problems (from 08/12/18 to present)    Problem Noted Resolved   Supervision of high risk pregnancy, antepartum 01/14/2017 by Trellis Moment, CMA No   Priority:  High     Overview Addendum 12/27/2018  2:15 PM by Vena Austria, MD     Clinic [facility] Prenatal Labs  Dating  Blood type: O negative  Genetic Screen NIPS: Normal XX, Inheritest negative Antibody: Negative  Anatomic [place] Normal Rubella: Immune Varicella: Immune  GTT 116-early; 28 wk ( ) RPR: Immune  Rhogam [ ]  28 weeks HBsAg: negative  TDaP vaccine [ ]  30 weeks   HIV: negative  Baby Food Bottle                 GBS:   Contraception POPs Pap: 01/14/17 NIL HPV negative  Influenza Declined 3/9       Support Person Husband: Greig Castilla History of polyhydramnios last preganacy    HIgh risk for obesity with BMI=40, CHTN, habitual abortions, AMA, and anxiety/depression        History of polyhydramnios 10/07/2018 by Vena Austria, MD No   Maternal obesity, antepartum 01/18/2017 by Farrel Conners, CNM No   Overview Addendum 12/01/2018 11:36 AM by Vena Austria, MD    BMI >=40 [X]  bASA (>12 weeks) [ ]  Growth u/s 28 [ ] , 32 [ ] , 36 weeks [ ]  [ ]  NST/AFI weekly 36+ weeks (36[] , 37[] , 38[] , 39[] , 40[] ) [ ]  IOL by 41 weeks (scheduled, prn [] )        Chronic hypertension affecting pregnancy 01/18/2017 by Farrel Conners, CNM No   Overview Addendum 08/14/2017  4:25 PM by Natale Milch, MD    Monitor blood pressures. CMP and TSH WNL. Baseline PC ratio 98 mgm  [X]  Aspirin 81 mg daily after  12 weeks; discontinue after 36 weeks [X]  baseline labs with CBC, CMP, urine protein/creatinine ratio [ ]  no BP meds unless BPs become elevated [ ]  ultrasound for growth at [X]  28 ordered, [x]  32, 36 [ ]  weeks   Current antihypertensives:  None   Baseline and surveillance labs ([facility], refresh links as needed)  Lab Results  Component Value Date   PLT 283 01/23/2017   CREATININE 0.65 01/23/2017   AST 11  01/23/2017   ALT 7 01/23/2017    Antenatal Testing CHTN - O10.919  Group I  BP < 140/90, no preeclampsia, AGA,  nml AFV, +/- meds    Group II BP > 140/90, on meds, no preeclampsia, AGA, nml AFV  20-28-34-38  20-24-28-32-35-38  32//2 x wk  28//BPP wkly then 32//2 x wk  40 no meds; 39 meds  PRN or 2937             Gestational age appropriate obstetric precautions including but not limited to vaginal bleeding, contractions, leaking of fluid and fetal movement were reviewed in detail with the patient.    - growth scan, 28 week labs, and rhogam next visit - BP within goal in setting of CHTN, no headaches or vision changes  Return in about 4 weeks (around 02/18/2019) for ROB, growth scan and 28 week labs.  Vena AustriaAndreas Declyn Delsol, MD, Evern CoreFACOG [facility], [facility] 01/21/2019, 2:05 PM

## 2019-02-17 ENCOUNTER — Encounter: Payer: BLUE CROSS/BLUE SHIELD | Admitting: Obstetrics and Gynecology

## 2019-02-17 ENCOUNTER — Other Ambulatory Visit: Payer: BLUE CROSS/BLUE SHIELD

## 2019-02-22 ENCOUNTER — Other Ambulatory Visit: Payer: BLUE CROSS/BLUE SHIELD

## 2019-02-22 ENCOUNTER — Ambulatory Visit (INDEPENDENT_AMBULATORY_CARE_PROVIDER_SITE_OTHER): Payer: BLUE CROSS/BLUE SHIELD

## 2019-02-22 ENCOUNTER — Ambulatory Visit (INDEPENDENT_AMBULATORY_CARE_PROVIDER_SITE_OTHER): Payer: BLUE CROSS/BLUE SHIELD | Admitting: Obstetrics and Gynecology

## 2019-02-22 ENCOUNTER — Other Ambulatory Visit: Payer: Self-pay

## 2019-02-22 VITALS — BP 134/88 | Wt 280.0 lb

## 2019-02-22 DIAGNOSIS — O0992 Supervision of high risk pregnancy, unspecified, second trimester: Secondary | ICD-10-CM

## 2019-02-22 DIAGNOSIS — O99210 Obesity complicating pregnancy, unspecified trimester: Secondary | ICD-10-CM

## 2019-02-22 DIAGNOSIS — O10912 Unspecified pre-existing hypertension complicating pregnancy, second trimester: Secondary | ICD-10-CM

## 2019-02-22 DIAGNOSIS — O10919 Unspecified pre-existing hypertension complicating pregnancy, unspecified trimester: Secondary | ICD-10-CM

## 2019-02-22 DIAGNOSIS — O0990 Supervision of high risk pregnancy, unspecified, unspecified trimester: Secondary | ICD-10-CM

## 2019-02-22 DIAGNOSIS — Z3A27 27 weeks gestation of pregnancy: Secondary | ICD-10-CM | POA: Diagnosis not present

## 2019-02-22 DIAGNOSIS — O26893 Other specified pregnancy related conditions, third trimester: Secondary | ICD-10-CM

## 2019-02-22 DIAGNOSIS — O99212 Obesity complicating pregnancy, second trimester: Secondary | ICD-10-CM

## 2019-02-22 DIAGNOSIS — O09292 Supervision of pregnancy with other poor reproductive or obstetric history, second trimester: Secondary | ICD-10-CM

## 2019-02-22 DIAGNOSIS — Z362 Encounter for other antenatal screening follow-up: Secondary | ICD-10-CM | POA: Diagnosis not present

## 2019-02-22 DIAGNOSIS — Z6791 Unspecified blood type, Rh negative: Secondary | ICD-10-CM | POA: Diagnosis not present

## 2019-02-22 DIAGNOSIS — Z8759 Personal history of other complications of pregnancy, childbirth and the puerperium: Secondary | ICD-10-CM

## 2019-02-22 MED ORDER — RHO D IMMUNE GLOBULIN 1500 UNIT/2ML IJ SOSY
300.0000 ug | PREFILLED_SYRINGE | Freq: Once | INTRAMUSCULAR | Status: AC
  Administered 2019-02-22: 300 ug via INTRAMUSCULAR

## 2019-02-22 NOTE — Progress Notes (Signed)
Routine Prenatal Care Visit  Subjective  Tiffany Chapman is a 38 y.o. G5P1031 at 4660w5d being seen today for ongoing prenatal care.  She is currently monitored for the following issues for this high-risk pregnancy and has Supervision of high risk pregnancy, antepartum; Maternal obesity, antepartum; BMI 40.0-44.9, adult (HCC); Chronic hypertension affecting pregnancy; Anxiety and depression; and History of polyhydramnios on their problem list.  ----------------------------------------------------------------------------------- Patient reports no complaints.   Contractions: Not present. Vag. Bleeding: None.  Movement: Present. Denies leaking of fluid.  ----------------------------------------------------------------------------------- The following portions of the patient's history were reviewed and updated as appropriate: allergies, current medications, past family history, past medical history, past social history, past surgical history and problem list. Problem list updated.   Objective  Blood pressure 134/88, weight 280 lb (127 kg), last menstrual period 08/12/2018, not currently breastfeeding. Pregravid weight 256 lb (116.1 kg) Total Weight Gain 24 lb (10.9 kg) Urinalysis:      Fetal Status: Fetal Heart Rate (bpm): 130 Fundal Height: 29 cm Movement: Present  Presentation: Transverse  General:  Alert, oriented and cooperative. Patient is in no acute distress.  Skin: Skin is warm and dry. No rash noted.   Cardiovascular: Normal heart rate noted  Respiratory: Normal respiratory effort, no problems with respiration noted  Abdomen: Soft, gravid, appropriate for gestational age. Pain/Pressure: Present     Pelvic:  Cervical exam deferred        Extremities: Normal range of motion.     ental Status: Normal mood and affect. Normal behavior. Normal judgment and thought content.   [place] Ob Follow Up  Result Date: 02/22/2019 Patient Name: Tiffany Chapman DOB: 04/07/1981 MRN: [id number] ULTRASOUND  REPORT Location: [facility] Date of Service: 02/22/2019 Indications: Growth Findings: Mason JimSingleton intrauterine pregnancy is visualized with FHR at 124 BPM. Biometrics give an (U/S) Gestational age of 4344w1d and an (U/S) EDD of 05/16/19; this correlates with the clinically established Estimated Date of Delivery: 05/19/19. Fetal presentation is transverse, head maternal left. Placenta: anterior. Grade: 2 AFI: Subjectively normal. Growth percentile is 55.2. EFW: 1,154g (2 lbs 9 oz) Impression: 1. 4360w5d Viable Singleton Intrauterine pregnancy previously established criteria. 2. Growth is 55.2 %ile. Recommendations: 1.Clinical correlation with the patient's History and Physical Exam. Darlina GuysAbby M Clarke, RT There is a singleton gestation with normal amniotic fluid volume. The fetal biometry correlates with established dating.  Limited fetal anatomy was performed.The visualized fetal anatomical survey appears within normal limits within the resolution of ultrasound as described above.  It must be noted that a normal ultrasound is unable to rule out fetal aneuploidy.  Vena AustriaAndreas Zariel Capano, MD, Evern CoreFACOG [facility], [facility] 02/22/2019, 9:26 AM    Assessment   38 y.o. [id number] at 460w5d by  05/19/2019, by Last Menstrual Period presenting for routine prenatal visit  Plan   Pregnancy#5 Problems (from 08/12/18 to present)    Problem Noted Resolved   Supervision of high risk pregnancy, antepartum 01/14/2017 by Trellis Momentroy, Hollie, CMA No   Priority:  High     Overview Addendum 12/27/2018  2:15 PM by Vena AustriaStaebler, Towana Stenglein, MD     [facility] Labs  Dating  Blood type: O negative  Genetic Screen NIPS: Normal XX, Inheritest negative Antibody: Negative  Anatomic US Normal Rubella: Immune Varicella: Immune  GTT 116-early; 28 wk ( ) RPR: Immune  Rhogam [ ]  28 weeks HBsAg: negative  TDaP vaccine [ ]  30 weeks   HIV: negative  Baby Food Bottle  GBS:   Contraception POPs Pap: 01/14/17 NIL HPV negative   Influenza Declined 3/9       Support Person Husband: [name] History of polyhydramnios last preganacy    HIgh risk for obesity with BMI=40, CHTN, habitual abortions, AMA, and anxiety/depression        History of polyhydramnios 10/07/2018 by Malachy Mood, MD No   Maternal obesity, antepartum 01/18/2017 by Dalia Heading, CNM No   Overview Addendum 12/01/2018 11:36 AM by Malachy Mood, MD    BMI >=40 [X]  bASA (>12 weeks) [ ]  Growth u/s 28 [ ] , 32 [ ] , 36 weeks [ ]  [ ]  NST/AFI weekly 36+ weeks (36[] , 37[] , 38[] , 39[] , 40[] ) [ ]  IOL by 41 weeks (scheduled, prn [] )        Chronic hypertension affecting pregnancy 01/18/2017 by Dalia Heading, CNM No   Overview Addendum 08/14/2017  4:25 PM by Homero Fellers, MD    Monitor blood pressures. CMP and TSH WNL. Baseline PC ratio 98 mgm  [X]  Aspirin 81 mg daily after 12 weeks; discontinue after 36 weeks [X]  baseline labs with CBC, CMP, urine protein/creatinine ratio [ ]  no BP meds unless BPs become elevated [ ]  ultrasound for growth at [X]  28 ordered, [x]  32, 36 [ ]  weeks   Current antihypertensives:  None   Baseline and surveillance labs (pulled in from [facility], refresh links as needed)  Lab Results  Component Value Date   PLT 283 01/23/2017   CREATININE 0.65 01/23/2017   AST 11 01/23/2017   ALT 7 01/23/2017    Antenatal Testing CHTN - O10.919  Group I  BP < 140/90, no preeclampsia, AGA,  nml AFV, +/- meds    Group II BP > 140/90, on meds, no preeclampsia, AGA, nml AFV  20-28-34-38  20-24-28-32-35-38  32//2 x wk  28//BPP wkly then 32//2 x wk  40 no meds; 39 meds  PRN or 60             Gestational age appropriate obstetric precautions including but not limited to vaginal bleeding, contractions, leaking of fluid and fetal movement were reviewed in detail with the patient.    - Growth scan 55%ile today - 28 week labs and rhogam  No follow-ups on file.  Malachy Mood, MD, [facility], [facility] 02/22/2019, 9:29 AM

## 2019-02-22 NOTE — Progress Notes (Signed)
ROB Growth scan  28 weeks labs Rhogam

## 2019-02-23 ENCOUNTER — Telehealth: Payer: Self-pay | Admitting: Obstetrics and Gynecology

## 2019-02-23 ENCOUNTER — Other Ambulatory Visit: Payer: Self-pay | Admitting: Obstetrics and Gynecology

## 2019-02-23 DIAGNOSIS — O10919 Unspecified pre-existing hypertension complicating pregnancy, unspecified trimester: Secondary | ICD-10-CM

## 2019-02-23 DIAGNOSIS — Z8759 Personal history of other complications of pregnancy, childbirth and the puerperium: Secondary | ICD-10-CM

## 2019-02-23 DIAGNOSIS — O99210 Obesity complicating pregnancy, unspecified trimester: Secondary | ICD-10-CM

## 2019-02-23 DIAGNOSIS — O0990 Supervision of high risk pregnancy, unspecified, unspecified trimester: Secondary | ICD-10-CM

## 2019-02-23 DIAGNOSIS — O99810 Abnormal glucose complicating pregnancy: Secondary | ICD-10-CM

## 2019-02-23 LAB — 28 WEEKS RH-PANEL
Antibody Screen: NEGATIVE
Basophils Absolute: 0 10*3/uL (ref 0.0–0.2)
Basos: 0 %
EOS (ABSOLUTE): 0.1 10*3/uL (ref 0.0–0.4)
Eos: 1 %
Gestational Diabetes Screen: 180 mg/dL — ABNORMAL HIGH (ref 65–139)
HIV Screen 4th Generation wRfx: NONREACTIVE
Hematocrit: 32.4 % — ABNORMAL LOW (ref 34.0–46.6)
Hemoglobin: 11.1 g/dL (ref 11.1–15.9)
Immature Grans (Abs): 0.1 10*3/uL (ref 0.0–0.1)
Immature Granulocytes: 1 %
Lymphocytes Absolute: 1.7 10*3/uL (ref 0.7–3.1)
Lymphs: 16 %
MCH: 32.1 pg (ref 26.6–33.0)
MCHC: 34.3 g/dL (ref 31.5–35.7)
MCV: 94 fL (ref 79–97)
Monocytes Absolute: 0.4 10*3/uL (ref 0.1–0.9)
Monocytes: 4 %
Neutrophils Absolute: 8.2 10*3/uL — ABNORMAL HIGH (ref 1.4–7.0)
Neutrophils: 78 %
Platelets: 240 10*3/uL (ref 150–450)
RBC: 3.46 x10E6/uL — ABNORMAL LOW (ref 3.77–5.28)
RDW: 12.1 % (ref 11.7–15.4)
RPR Ser Ql: NONREACTIVE
WBC: 10.4 10*3/uL (ref 3.4–10.8)

## 2019-02-23 NOTE — Telephone Encounter (Signed)
Called and left voice mail for patient to call back to be schedule °

## 2019-02-23 NOTE — Telephone Encounter (Signed)
-----   Message from Malachy Mood, MD sent at 02/23/2019  8:11 AM EDT ----- Regarding: 3-hr glucose test Needs 3-hr glucose test in the next week.   Malachy Mood, MD, [facility], [facility] 02/23/2019, 8:11 AM

## 2019-02-25 ENCOUNTER — Other Ambulatory Visit: Payer: BLUE CROSS/BLUE SHIELD

## 2019-02-25 ENCOUNTER — Other Ambulatory Visit: Payer: Self-pay

## 2019-02-25 DIAGNOSIS — O99810 Abnormal glucose complicating pregnancy: Secondary | ICD-10-CM

## 2019-02-25 DIAGNOSIS — O0990 Supervision of high risk pregnancy, unspecified, unspecified trimester: Secondary | ICD-10-CM

## 2019-02-26 LAB — GESTATIONAL GLUCOSE TOLERANCE
Glucose, Fasting: 106 mg/dL — ABNORMAL HIGH (ref 65–94)
Glucose, GTT - 1 Hour: 204 mg/dL — ABNORMAL HIGH (ref 65–179)
Glucose, GTT - 2 Hour: 151 mg/dL (ref 65–154)
Glucose, GTT - 3 Hour: 113 mg/dL (ref 65–139)

## 2019-02-28 ENCOUNTER — Other Ambulatory Visit: Payer: Self-pay | Admitting: Obstetrics and Gynecology

## 2019-02-28 ENCOUNTER — Encounter: Payer: Self-pay | Admitting: Obstetrics and Gynecology

## 2019-02-28 ENCOUNTER — Telehealth: Payer: Self-pay | Admitting: Obstetrics and Gynecology

## 2019-02-28 DIAGNOSIS — O24410 Gestational diabetes mellitus in pregnancy, diet controlled: Secondary | ICD-10-CM

## 2019-02-28 DIAGNOSIS — O24414 Gestational diabetes mellitus in pregnancy, insulin controlled: Secondary | ICD-10-CM | POA: Insufficient documentation

## 2019-02-28 MED ORDER — ACCU-CHEK GUIDE VI STRP: ORAL_STRIP | 12 refills | Status: DC

## 2019-02-28 MED ORDER — ACCU-CHEK FASTCLIX LANCETS MISC: 1.0000 [IU] | Freq: Four times a day (QID) | 12 refills | Status: DC

## 2019-02-28 MED ORDER — ACCU-CHEK GUIDE W/DEVICE KIT: 1.0000 | PACK | 0 refills | Status: DC

## 2019-02-28 NOTE — Telephone Encounter (Signed)
So doesn't need to see me until next week.  I actually need some blood sugars to review before I need to see her in the office

## 2019-02-28 NOTE — Telephone Encounter (Signed)
-----   Message from Malachy Mood, MD sent at 02/28/2019  8:07 AM EDT ----- Regarding: Apt Can we have her have a follow up in 1 week to go over her first week of blood sugar values   Malachy Mood, MD, [place], [facility] 02/28/2019, 8:08 AM

## 2019-02-28 NOTE — Progress Notes (Signed)
life

## 2019-02-28 NOTE — Telephone Encounter (Signed)
Called and left voice mail for patient to call back to be schedule °

## 2019-03-01 ENCOUNTER — Encounter: Payer: BLUE CROSS/BLUE SHIELD | Admitting: Obstetrics and Gynecology

## 2019-03-07 ENCOUNTER — Encounter: Payer: Self-pay | Admitting: *Deleted

## 2019-03-07 ENCOUNTER — Encounter: Payer: BLUE CROSS/BLUE SHIELD | Attending: Obstetrics and Gynecology | Admitting: *Deleted

## 2019-03-07 ENCOUNTER — Other Ambulatory Visit: Payer: Self-pay

## 2019-03-07 VITALS — BP 110/70 | Ht 67.0 in | Wt 274.1 lb

## 2019-03-07 DIAGNOSIS — O24410 Gestational diabetes mellitus in pregnancy, diet controlled: Secondary | ICD-10-CM | POA: Diagnosis not present

## 2019-03-07 DIAGNOSIS — Z713 Dietary counseling and surveillance: Secondary | ICD-10-CM | POA: Diagnosis not present

## 2019-03-07 NOTE — Progress Notes (Signed)
Diabetes Self-Management Education  Visit Type: First/Initial  Appt. Start Time: 1335 Appt. End Time: 1500  03/07/2019  Tiffany Chapman, identified by name and date of birth, is a 38 y.o. female with a diagnosis of Diabetes: Gestational Diabetes.   ASSESSMENT  Blood pressure 110/70, height 5\' 7"  (1.702 m), weight 274 lb 1.6 oz (124.3 kg), last menstrual period 08/12/2018, estimated date of delivery 05/19/2019 Body mass index is 42.93 kg/m.  Diabetes Self-Management Education - 03/07/19 1445      Visit Information   Visit Type  First/Initial      Initial Visit   Diabetes Type  Gestational Diabetes    Are you currently following a meal plan?  Yes    What type of meal plan do you follow?  "recently switched to no sugars (processed) low carb"    Are you taking your medications as prescribed?  Yes    Date Diagnosed  02/28/2019      Health Coping   How would you rate your overall health?  Fair      Psychosocial Assessment   Patient Belief/Attitude about Diabetes  Other (comment)   "annoyed and frustrated"   Self-care barriers  None    Self-management support  Doctor's office;Family    Patient Concerns  Nutrition/Meal planning;Monitoring;Glycemic Control;Weight Control;Healthy Lifestyle    Special Needs  None    Preferred Learning Style  Auditory    How often do you need to have someone help you when you read instructions, pamphlets, or other written materials from your doctor or pharmacy?  1 - Never    What is the last grade level you completed in school?  12th      Pre-Education Assessment   Patient understands the diabetes disease and treatment process.  Needs Instruction    Patient understands incorporating nutritional management into lifestyle.  Needs Instruction    Patient undertands incorporating physical activity into lifestyle.  Needs Review    Patient understands using medications safely.  Needs Instruction    Patient understands monitoring blood glucose, interpreting  and using results  Needs Review    Patient understands prevention, detection, and treatment of acute complications.  Needs Instruction    Patient understands prevention, detection, and treatment of chronic complications.  Needs Instruction    Patient understands how to develop strategies to address psychosocial issues.  Needs Instruction    Patient understands how to develop strategies to promote health/change behavior.  Needs Instruction      Complications   How often do you check your blood sugar?  3-4 times/day    Fasting Blood glucose range (mg/dL)  [phone]   FBG's range from 104-116 mg/dL.   Postprandial Blood glucose range (mg/dL)  57-846;[phone];130-179   pp's 86-173 mg/dL ([id number] after eating pasta)   Have you had a dilated eye exam in the past 12 months?  No    Have you had a dental exam in the past 12 months?  No    Are you checking your feet?  No      Dietary Intake   Breakfast  carnation instant breakfast drink    Snack (morning)  peanuts    Lunch  salads from [employer]; chicken sandwich without the bread    Snack (afternoon)  protein bar    Dinner  salmon, chicken, pork, [place]key wiht rice, corn, black beans, green beans, broccoli, lettuce, tomatoes, carrots, cuccumbers, cauliflower, zucchini, squash    Snack (evening)  Glucerna shake or protein shake    Beverage(s)  water  Exercise   Exercise Type  Light (walking / raking leaves)    How many days per week to you exercise?  5    How many minutes per day do you exercise?  25    Total minutes per week of exercise  125      Patient Education   Previous Diabetes Education  No    Disease state   Definition of diabetes, type 1 and 2, and the diagnosis of diabetes;Factors that contribute to the development of diabetes    Nutrition management   Role of diet in the treatment of diabetes and the relationship between the three main macronutrients and blood glucose level;Food label reading, portion sizes and measuring food.;Reviewed blood  glucose goals for pre and post meals and how to evaluate the patients' food intake on their blood glucose level.    Physical activity and exercise   Role of exercise on diabetes management, blood pressure control and cardiac health.    Monitoring  Purpose and frequency of SMBG.;Taught/discussed recording of test results and interpretation of SMBG.;Ketone testing, when, how.    Chronic complications  Relationship between chronic complications and blood glucose control    Psychosocial adjustment  Identified and addressed patients feelings and concerns about diabetes    Preconception care  Pregnancy and GDM  Role of pre-pregnancy blood glucose control on the development of the fetus;Reviewed with patient blood glucose goals with pregnancy;Role of family planning for patients with diabetes      Individualized Goals (developed by patient)   Reducing Risk  Improve blood sugars Prevent diabetes complications Lose weight Lead a healthier lifestyle     Outcomes   Expected Outcomes  Demonstrated interest in learning. Expect positive outcomes    Future DMSE  2 wks       Individualized Plan for Diabetes Self-Management Training:   Learning Objective:  Patient will have a greater understanding of diabetes self-management. Patient education plan is to attend individual and/or group sessions per assessed needs and concerns.   Plan:   Patient Instructions  Read booklet on Gestational Diabetes Follow Gestational Meal Planning Guidelines Allow 2-3 hours between meals and snacks Complete a 3 Day Food Record and bring to next appointment Check blood sugars 4 x day - before breakfast and 2 hrs after every meal and record  Bring blood sugar log to all appointments Purchase urine ketone strips if ordered by MD and check urine ketones every am:  If + increase bedtime snack to 1 protein and 2 carbohydrate servings Walk 20-30 minutes at least 5 x week if permitted by MD  Expected Outcomes:  Demonstrated  interest in learning. Expect positive outcomes  Education material provided:  Gestational Booklet Gestational Meal Planning Guidelines Simple Meal Plan Viewed Gestational Diabetes Video 3 Day Food Record Goals for a Healthy Pregnancy  If problems or questions, patient to contact team via:  Tiffany Chapman, [place], [place], CDE [phone]  Future DSME appointment: 2 wks  March 21, 2019 with the [place]

## 2019-03-07 NOTE — Patient Instructions (Signed)
Read booklet on Gestational Diabetes Follow Gestational Meal Planning Guidelines Allow 2-3 hours between meals and snacks Complete a 3 Day Food Record and bring to next appointment Check blood sugars 4 x day - before breakfast and 2 hrs after every meal and record  Bring blood sugar log to all appointments Purchase urine ketone strips if ordered by MD and check urine ketones every am:  If + increase bedtime snack to 1 protein and 2 carbohydrate servings Walk 20-30 minutes at least 5 x week if permitted by MD

## 2019-03-08 ENCOUNTER — Encounter: Payer: BLUE CROSS/BLUE SHIELD | Admitting: Obstetrics and Gynecology

## 2019-03-09 ENCOUNTER — Other Ambulatory Visit: Payer: Self-pay

## 2019-03-09 ENCOUNTER — Ambulatory Visit (INDEPENDENT_AMBULATORY_CARE_PROVIDER_SITE_OTHER): Payer: BLUE CROSS/BLUE SHIELD | Admitting: Obstetrics and Gynecology

## 2019-03-09 VITALS — BP 134/90 | Wt 276.0 lb

## 2019-03-09 DIAGNOSIS — O24410 Gestational diabetes mellitus in pregnancy, diet controlled: Secondary | ICD-10-CM

## 2019-03-09 DIAGNOSIS — O99210 Obesity complicating pregnancy, unspecified trimester: Secondary | ICD-10-CM

## 2019-03-09 DIAGNOSIS — Z3A29 29 weeks gestation of pregnancy: Secondary | ICD-10-CM

## 2019-03-09 DIAGNOSIS — O0990 Supervision of high risk pregnancy, unspecified, unspecified trimester: Secondary | ICD-10-CM

## 2019-03-09 DIAGNOSIS — O10919 Unspecified pre-existing hypertension complicating pregnancy, unspecified trimester: Secondary | ICD-10-CM

## 2019-03-09 DIAGNOSIS — O10913 Unspecified pre-existing hypertension complicating pregnancy, third trimester: Secondary | ICD-10-CM

## 2019-03-09 DIAGNOSIS — O0993 Supervision of high risk pregnancy, unspecified, third trimester: Secondary | ICD-10-CM

## 2019-03-09 DIAGNOSIS — O99213 Obesity complicating pregnancy, third trimester: Secondary | ICD-10-CM

## 2019-03-09 LAB — POCT URINALYSIS DIPSTICK OB
Glucose, UA: NEGATIVE
POC,PROTEIN,UA: NEGATIVE

## 2019-03-09 MED ORDER — METFORMIN HCL 500 MG PO TABS: 500.0000 mg | ORAL_TABLET | Freq: Every day | ORAL | 11 refills | Status: DC

## 2019-03-09 NOTE — Telephone Encounter (Signed)
I accidentally put in 4 week on follow up it should be 2 weeks so that ROB/NST/AFI should be on the 13th if we have an opening

## 2019-03-09 NOTE — Progress Notes (Signed)
Routine Prenatal Care Visit  Subjective  Tiffany Chapman is a 38 y.o. G5P1031 at 5836w6d being seen today for ongoing prenatal care.  She is currently monitored for the following issues for this high-risk pregnancy and has Supervision of high risk pregnancy, antepartum; Maternal obesity, antepartum; BMI 40.0-44.9, adult (HCC); Chronic hypertension affecting pregnancy; Anxiety and depression; History of polyhydramnios; and Gestational diabetes on their problem list.  ----------------------------------------------------------------------------------- Patient reports no complaints.   Contractions: Not present. Vag. Bleeding: None.  Movement: Present. Denies leaking of fluid.  ----------------------------------------------------------------------------------- The following portions of the patient's history were reviewed and updated as appropriate: allergies, current medications, past family history, past medical history, past social history, past surgical history and problem list. Problem list updated.   Objective  Blood pressure 134/90, weight 276 lb (125.2 kg), last menstrual period 08/12/2018, not currently breastfeeding. Pregravid weight 256 lb (116.1 kg) Total Weight Gain 20 lb (9.072 kg) Urinalysis:      Fetal Status:     Movement: Present     General:  Alert, oriented and cooperative. Patient is in no acute distress.  Skin: Skin is warm and dry. No rash noted.   Cardiovascular: Normal heart rate noted  Respiratory: Normal respiratory effort, no problems with respiration noted  Abdomen: Soft, gravid, appropriate for gestational age. Pain/Pressure: Absent     Pelvic:  Cervical exam deferred        Extremities: Normal range of motion.     ental Status: Normal mood and affect. Normal behavior. Normal judgment and thought content.     Assessment   38 y.o. [id number] at 6436w6d by  05/19/2019, by Last Menstrual Period presenting for routine prenatal visit  Plan   Pregnancy#5 Problems  (from 08/12/18 to present)    Problem Noted Resolved   Supervision of high risk pregnancy, antepartum 01/14/2017 by Trellis Momentroy, Hollie, CMA No   Priority:  High     Overview Addendum 02/28/2019  8:06 AM by Vena AustriaStaebler, Ayano Douthitt, MD     [facility]  Dating  Blood type: O negative  Genetic Screen NIPS: Normal XX, Inheritest negative Antibody: Negative  Anatomic US Normal Rubella: Immune Varicella: Immune  GTT 116-early; 28 wk 180 3-hr 106 / 204 / 151 / 113 RPR: Immune  Rhogam [X]  28 weeks HBsAg: negative  TDaP vaccine [ ]  30 weeks   HIV: negative  Baby Food Bottle                 GBS:   Contraception POPs Pap: 01/14/17 NIL HPV negative  Influenza Declined 3/9       Support Person Husband: Greig CastillaAndrew History of polyhydramnios last preganacy    HIgh risk for obesity with BMI=40, CHTN, habitual abortions, AMA, and anxiety/depression        Gestational diabetes 02/28/2019 by Vena AustriaStaebler, Sopheap Boehle, MD No   History of polyhydramnios 10/07/2018 by Vena AustriaStaebler, Gianina Olinde, MD No   Maternal obesity, antepartum 01/18/2017 by Farrel ConnersGutierrez, Colleen, CNM No   Overview Addendum 02/22/2019  9:28 AM by Vena AustriaStaebler, Anaiya Wisinski, MD    BMI >=40 [X]  bASA (>12 weeks) [ ]  Growth u/s 28 [X]  1,154g (2lbs 9oz) c/w 55.2%ile, 32 [ ] , 36 weeks [ ]  [ ]  NST/AFI weekly 36+ weeks (36[] , 37[] , 38[] , 39[] , 40[] ) [ ]  IOL by 41 weeks (scheduled, prn [] )        Chronic hypertension affecting pregnancy 01/18/2017 by Farrel ConnersGutierrez, Colleen, CNM No   Overview Addendum 08/14/2017  4:25 PM by Natale MilchSchuman, Christanna R, MD    Monitor  blood pressures. CMP and TSH WNL. Baseline PC ratio 98 mgm  [X]  Aspirin 81 mg daily after 12 weeks; discontinue after 36 weeks [X]  baseline labs with CBC, CMP, urine protein/creatinine ratio [ ]  no BP meds unless BPs become elevated [ ]  ultrasound for growth at [X]  28 ordered, [x]  32, 36 [ ]  weeks   Current antihypertensives:  None   Baseline and surveillance labs (pulled in from [facility], refresh links as needed)  Lab  Results  Component Value Date   PLT 283 01/23/2017   CREATININE 0.65 01/23/2017   AST 11 01/23/2017   ALT 7 01/23/2017    Antenatal Testing CHTN - O10.919  Group I  BP < 140/90, no preeclampsia, AGA,  nml AFV, +/- meds    Group II BP > 140/90, on meds, no preeclampsia, AGA, nml AFV  20-28-34-38  20-24-28-32-35-38  32//2 x wk  28//BPP wkly then 32//2 x wk  40 no meds; 39 meds  PRN or 23             Gestational age appropriate obstetric precautions including but not limited to vaginal bleeding, contractions, leaking of fluid and fetal movement were reviewed in detail with the patient.   - 100% of fasting elevated, postprandial look good.  Add evening metformin 500mg    Return in about 4 weeks (around 04/06/2019) for ROB and growth scan.  Malachy Mood, MD, [facility], [facility] 03/09/2019, 9:58 AM

## 2019-03-10 NOTE — Telephone Encounter (Signed)
Called and left voicemail for patient to call back to confirm °

## 2019-03-21 ENCOUNTER — Encounter: Payer: BLUE CROSS/BLUE SHIELD | Attending: Obstetrics and Gynecology | Admitting: Dietician

## 2019-03-21 ENCOUNTER — Encounter: Payer: Self-pay | Admitting: Dietician

## 2019-03-21 ENCOUNTER — Other Ambulatory Visit: Payer: Self-pay | Admitting: Obstetrics and Gynecology

## 2019-03-21 ENCOUNTER — Ambulatory Visit (INDEPENDENT_AMBULATORY_CARE_PROVIDER_SITE_OTHER): Payer: BLUE CROSS/BLUE SHIELD

## 2019-03-21 ENCOUNTER — Ambulatory Visit (INDEPENDENT_AMBULATORY_CARE_PROVIDER_SITE_OTHER): Payer: BLUE CROSS/BLUE SHIELD | Admitting: Obstetrics and Gynecology

## 2019-03-21 ENCOUNTER — Other Ambulatory Visit: Payer: Self-pay

## 2019-03-21 ENCOUNTER — Encounter: Payer: BLUE CROSS/BLUE SHIELD | Admitting: Obstetrics and Gynecology

## 2019-03-21 VITALS — BP 136/80 | Wt 278.0 lb

## 2019-03-21 VITALS — Ht 67.0 in | Wt 274.7 lb

## 2019-03-21 DIAGNOSIS — Z362 Encounter for other antenatal screening follow-up: Secondary | ICD-10-CM | POA: Diagnosis not present

## 2019-03-21 DIAGNOSIS — O09293 Supervision of pregnancy with other poor reproductive or obstetric history, third trimester: Secondary | ICD-10-CM | POA: Diagnosis not present

## 2019-03-21 DIAGNOSIS — O0993 Supervision of high risk pregnancy, unspecified, third trimester: Secondary | ICD-10-CM

## 2019-03-21 DIAGNOSIS — Z713 Dietary counseling and surveillance: Secondary | ICD-10-CM | POA: Diagnosis not present

## 2019-03-21 DIAGNOSIS — O10913 Unspecified pre-existing hypertension complicating pregnancy, third trimester: Secondary | ICD-10-CM

## 2019-03-21 DIAGNOSIS — O10919 Unspecified pre-existing hypertension complicating pregnancy, unspecified trimester: Secondary | ICD-10-CM

## 2019-03-21 DIAGNOSIS — Z09 Encounter for follow-up examination after completed treatment for conditions other than malignant neoplasm: Secondary | ICD-10-CM

## 2019-03-21 DIAGNOSIS — O99210 Obesity complicating pregnancy, unspecified trimester: Secondary | ICD-10-CM

## 2019-03-21 DIAGNOSIS — O24410 Gestational diabetes mellitus in pregnancy, diet controlled: Secondary | ICD-10-CM

## 2019-03-21 DIAGNOSIS — Z8759 Personal history of other complications of pregnancy, childbirth and the puerperium: Secondary | ICD-10-CM

## 2019-03-21 DIAGNOSIS — O0990 Supervision of high risk pregnancy, unspecified, unspecified trimester: Secondary | ICD-10-CM

## 2019-03-21 DIAGNOSIS — O99213 Obesity complicating pregnancy, third trimester: Secondary | ICD-10-CM

## 2019-03-21 DIAGNOSIS — Z3A31 31 weeks gestation of pregnancy: Secondary | ICD-10-CM

## 2019-03-21 MED ORDER — METFORMIN HCL 500 MG PO TABS: 1000.0000 mg | ORAL_TABLET | Freq: Every day | ORAL | 11 refills | Status: DC

## 2019-03-21 NOTE — Progress Notes (Signed)
ROB Ultrasound/NST 

## 2019-03-21 NOTE — Progress Notes (Signed)
   Patient's BG record indicates fasting BGs are typically above goal, ranging 101-117, and post-meal BGs fluctuating, ranging 86-156.  Patient's food diary indicates well balanced meals with appropriate carbohydrate intake.    Provided 1900kcal meal plan, and wrote individualized menus based on patient's food preferences.  Instructed patient on food safety, including avoidance of Listeriosis, and limiting mercury from fish.  Discussed importance of maintaining healthy lifestyle habits to reduce risk of Type 2 DM as well as Gestational DM with any future pregnancies.  Advised patient to use any remaining testing supplies to test some BGs after delivery, and to have BG tested ideally annually, as well as prior to attempting future pregnancies.

## 2019-03-21 NOTE — Progress Notes (Signed)
Routine Prenatal Care Visit  Subjective  Tiffany Chapman is a 38 y.o. [id number] at [redacted]w[redacted]d being seen today for ongoing prenatal care.  She is currently monitored for the following issues for this high-risk pregnancy and has Supervision of high risk pregnancy, antepartum; Maternal obesity, antepartum; BMI 40.0-44.9, adult ([place]); Chronic hypertension affecting pregnancy; Anxiety and depression; History of polyhydramnios; and Gestational diabetes on their problem list.  ----------------------------------------------------------------------------------- Patient reports no complaints.    .  .   . Denies leaking of fluid.  ----------------------------------------------------------------------------------- The following portions of the patient's history were reviewed and updated as appropriate: allergies, current medications, past family history, past medical history, past social history, past surgical history and problem list. Problem list updated.   Objective  Blood pressure 136/80, weight 278 lb (126.1 kg), last menstrual period 08/12/2018, not currently breastfeeding. Pregravid weight 256 lb (116.1 kg) Total Weight Gain 22 lb (9.979 kg) Urinalysis:      Fetal Status:           General:  Alert, oriented and cooperative. Patient is in no acute distress.  Skin: Skin is warm and dry. No rash noted.   Cardiovascular: Normal heart rate noted  Respiratory: Normal respiratory effort, no problems with respiration noted  Abdomen: Soft, gravid, appropriate for gestational age.       Pelvic:  Cervical exam deferred        Extremities: Normal range of motion.     ental Status: Normal Chapman and affect. Normal behavior. Normal judgment and thought content.   US Ob Follow Up  Result Date: 03/21/2019 Patient Name: Tiffany Chapman Tiffany Chapman MRN: [id number] ULTRASOUND REPORT Location: [facility] Date of Service: 03/21/2019 Indications:growth/afi Findings: [name] intrauterine pregnancy is visualized  with FHR at 155 BPM. Biometrics give an (U/S) Gestational age of [redacted]w[redacted]d and an (U/S) EDD of 05/08/2019; this correlates with the clinically established Estimated Date of Delivery: 05/19/19. Fetal presentation is transverse. Placenta: anterior. Grade: 1 AFI: 18.3 cm Growth percentile is 78.9. EFW: 2283 g (5 lb 1 oz ) Impression: 1. [redacted]w[redacted]d Viable Singleton Intrauterine pregnancy previously established criteria. 2. Growth is 78.9 %ile.  AFI is 18.3 cm. Recommendations: 1.Clinical correlation with the patient's History and Physical Exam. Tiffany Chapman, RT There is a singleton gestation with normal amniotic fluid volume. The fetal biometry correlates with established dating.  Limited fetal anatomy was performed.The visualized fetal anatomical survey appears within normal limits within the resolution of ultrasound as described above.  It must be noted that a normal ultrasound is unable to rule out fetal aneuploidy.  Tiffany Mood, MD, [facility], [facility] 03/21/2019, 11:00 AM   US Ob Follow Up  Result Date: 02/22/2019 Patient Name: Tiffany Chapman DOB: 22-May-1981 MRN: [id number] ULTRASOUND REPORT Location: [facility] Date of Service: 02/22/2019 Indications: Growth Findings: [name] intrauterine pregnancy is visualized with FHR at 77 BPM. Biometrics give an (U/S) Gestational age of [redacted]w[redacted]d and an (U/S) EDD of 05/16/19; this correlates with the clinically established Estimated Date of Delivery: 05/19/19. Fetal presentation is transverse, head maternal left. Placenta: anterior. Grade: 2 AFI: Subjectively normal. Growth percentile is 55.2. EFW: 1,154g (2 lbs 9 oz) Impression: 1. [redacted]w[redacted]d Viable Singleton Intrauterine pregnancy previously established criteria. 2. Growth is 55.2 %ile. Recommendations: 1.Clinical correlation with the patient's History and Physical Exam. Tiffany Chapman, RT There is a singleton gestation with normal amniotic fluid volume. The fetal biometry correlates with  established dating.  Limited fetal anatomy was performed.The visualized fetal  anatomical survey appears within normal limits within the resolution of ultrasound as described above.  It must be noted that a normal ultrasound is unable to rule out fetal aneuploidy.  Tiffany AustriaAndreas Zahraa Bhargava, MD, Evern CoreFACOG [facility], [facility] 02/22/2019, 9:26 AM  Baseline: 135 Variability: moderate Accelerations: present Decelerations: absent The patient was monitored for 30 minutes, fetal heart rate tracing was deemed reactive, category I tracing, Date fasting breakfast lunch dinner  7/2 114 121 100 106  7/3 105 118 120 128  7/4 103 110 114 107  7/5 112 106 145 135  7/6 109 102 99 111  7/7 109 117 96 118  7/8 101 105 108 112  7/9 108 100 115 124  7/10 111 94 100 111  7/11 111 100 99 108  7/12 109 98 134 111    Assessment   38 y.o. G5P1031 at 7668w4d by  05/19/2019, by Last Menstrual Period presenting for routine prenatal visit  Plan   Pregnancy#5 Problems (from 08/12/18 to present)    Problem Noted Resolved   Supervision of high risk pregnancy, antepartum 01/14/2017 by Trellis Momentroy, Hollie, CMA No   Priority:  High     Overview Addendum 02/28/2019  8:06 AM by Tiffany AustriaStaebler, Tiffany Branscom, MD     [facility]  Dating  Blood type: O negative  Genetic Screen NIPS: Normal XX, Inheritest negative Antibody: Negative  Anatomic US Normal Rubella: Immune Varicella: Immune  GTT 116-early; 28 wk 180 3-hr 106 / 204 / 151 / 113 RPR: Immune  Rhogam [X]  28 weeks HBsAg: negative  TDaP vaccine [ ]  30 weeks   HIV: negative  Baby Food Bottle                 GBS:   Contraception POPs Pap: 01/14/17 NIL HPV negative  Influenza Declined 3/9       Support Person Husband: Tiffany CastillaAndrew History of polyhydramnios last preganacy    HIgh risk for obesity with BMI=40, CHTN, habitual abortions, AMA, and anxiety/depression        Gestational diabetes 02/28/2019 by Tiffany AustriaStaebler, Ilma Achee, MD No   History of polyhydramnios 10/07/2018  by Tiffany AustriaStaebler, Jonetta Dagley, MD No   Maternal obesity, antepartum 01/18/2017 by Farrel ConnersGutierrez, Colleen, CNM No   Overview Addendum 02/22/2019  9:28 AM by Tiffany AustriaStaebler, Kalum Minner, MD    BMI >=40 [X]  bASA (>12 weeks) [ ]  Growth u/s 28 [X]  1,154g (2lbs 9oz) c/w 55.2%ile, 32 [ ] , 36 weeks [ ]  [ ]  NST/AFI weekly 36+ weeks (36[] , 37[] , 38[] , 39[] , 40[] ) [ ]  IOL by 41 weeks (scheduled, prn [] )        Chronic hypertension affecting pregnancy 01/18/2017 by Farrel ConnersGutierrez, Colleen, CNM No   Overview Addendum 08/14/2017  4:25 PM by Natale MilchSchuman, Christanna R, MD    Monitor blood pressures. CMP and TSH WNL. Baseline PC ratio 98 mgm  [X]  Aspirin 81 mg daily after 12 weeks; discontinue after 36 weeks [X]  baseline labs with CBC, CMP, urine protein/creatinine ratio [ ]  no BP meds unless BPs become elevated [ ]  ultrasound for growth at [X]  28 ordered, [x]  32, 36 [ ]  weeks   Current antihypertensives:  None   Baseline and surveillance labs ([facility], refresh links as needed)  Lab Results  Component Value Date   PLT 283 01/23/2017   CREATININE 0.65 01/23/2017   AST 11 01/23/2017   ALT 7 01/23/2017    Antenatal Testing CHTN - O10.919  Group I  BP < 140/90, no preeclampsia, AGA,  nml AFV, +/-  meds    Group II BP > 140/90, on meds, no preeclampsia, AGA, nml AFV  20-28-34-38  20-24-28-32-35-38  32//2 x wk  28//BPP wkly then 32//2 x wk  40 no meds; 39 meds  PRN or 3737             Gestational age appropriate obstetric precautions including but not limited to vaginal bleeding, contractions, leaking of fluid and fetal movement were reviewed in detail with the patient.    - Increase metformin to 1000mg  at bedtime  Return in about 1 week (around 03/28/2019) for ROB, NST, AFI.  Tiffany AustriaAndreas Maverick Dieudonne, MD, Merlinda FrederickFACOG [facility], [facility] 03/21/2019, 2:51 PM

## 2019-03-21 NOTE — Patient Instructions (Signed)
   Great job making healthy choices and controlling carb intake, keep it up!  Continue with light exercise as OK'd by MD, to help manage blood sugar as much as possible.   If you have symptoms of low blood sugar, check with meter and if under 70, take 4oz juice or regular (not diet) soda, allow 10-15 minutes for symptoms to resolve, then follow with a snack or meal.

## 2019-03-28 ENCOUNTER — Other Ambulatory Visit: Payer: Self-pay | Admitting: Obstetrics and Gynecology

## 2019-03-28 ENCOUNTER — Encounter: Payer: BLUE CROSS/BLUE SHIELD | Admitting: Obstetrics and Gynecology

## 2019-03-28 ENCOUNTER — Other Ambulatory Visit: Payer: BLUE CROSS/BLUE SHIELD

## 2019-03-28 DIAGNOSIS — O24415 Gestational diabetes mellitus in pregnancy, controlled by oral hypoglycemic drugs: Secondary | ICD-10-CM

## 2019-04-05 ENCOUNTER — Ambulatory Visit (INDEPENDENT_AMBULATORY_CARE_PROVIDER_SITE_OTHER): Payer: BLUE CROSS/BLUE SHIELD

## 2019-04-05 ENCOUNTER — Other Ambulatory Visit: Payer: Self-pay

## 2019-04-05 ENCOUNTER — Ambulatory Visit (INDEPENDENT_AMBULATORY_CARE_PROVIDER_SITE_OTHER): Payer: BLUE CROSS/BLUE SHIELD | Admitting: Obstetrics and Gynecology

## 2019-04-05 VITALS — BP 134/84 | Wt 277.0 lb

## 2019-04-05 DIAGNOSIS — O0990 Supervision of high risk pregnancy, unspecified, unspecified trimester: Secondary | ICD-10-CM

## 2019-04-05 DIAGNOSIS — O10919 Unspecified pre-existing hypertension complicating pregnancy, unspecified trimester: Secondary | ICD-10-CM

## 2019-04-05 DIAGNOSIS — O10913 Unspecified pre-existing hypertension complicating pregnancy, third trimester: Secondary | ICD-10-CM

## 2019-04-05 DIAGNOSIS — O0993 Supervision of high risk pregnancy, unspecified, third trimester: Secondary | ICD-10-CM

## 2019-04-05 DIAGNOSIS — O24410 Gestational diabetes mellitus in pregnancy, diet controlled: Secondary | ICD-10-CM

## 2019-04-05 DIAGNOSIS — O24415 Gestational diabetes mellitus in pregnancy, controlled by oral hypoglycemic drugs: Secondary | ICD-10-CM

## 2019-04-05 DIAGNOSIS — Z8759 Personal history of other complications of pregnancy, childbirth and the puerperium: Secondary | ICD-10-CM

## 2019-04-05 DIAGNOSIS — Z3A33 33 weeks gestation of pregnancy: Secondary | ICD-10-CM

## 2019-04-05 DIAGNOSIS — O99210 Obesity complicating pregnancy, unspecified trimester: Secondary | ICD-10-CM

## 2019-04-05 DIAGNOSIS — O99213 Obesity complicating pregnancy, third trimester: Secondary | ICD-10-CM

## 2019-04-05 DIAGNOSIS — O09293 Supervision of pregnancy with other poor reproductive or obstetric history, third trimester: Secondary | ICD-10-CM

## 2019-04-05 MED ORDER — NOVOFINE 32G X 6 MM MISC: 3 refills | Status: DC

## 2019-04-05 MED ORDER — INSULIN GLARGINE 100 UNIT/ML SOLOSTAR PEN: 28.0000 [IU] | PEN_INJECTOR | Freq: Every day | SUBCUTANEOUS | 11 refills | Status: DC

## 2019-04-05 MED ORDER — INSULIN ASPART 100 UNIT/ML FLEXPEN: 4.0000 [IU] | PEN_INJECTOR | Freq: Three times a day (TID) | SUBCUTANEOUS | 11 refills | Status: DC

## 2019-04-05 NOTE — Progress Notes (Signed)
Routine Prenatal Care Visit  Subjective  Tiffany Chapman is a 38 y.o. Z3G9924 at [redacted]w[redacted]d being seen today for ongoing prenatal care.  She is currently monitored for the following issues for this high-risk pregnancy and has Supervision of high risk pregnancy, antepartum; Maternal obesity, antepartum; BMI 40.0-44.9, adult ([place]); Chronic hypertension affecting pregnancy; Anxiety and depression; History of polyhydramnios; and Insulin controlled gestational diabetes mellitus (GDM) in third trimester on their problem list.  ----------------------------------------------------------------------------------- Patient reports no complaints.   Contractions: Not present. Vag. Bleeding: None.  Movement: Present. Denies leaking of fluid.  ----------------------------------------------------------------------------------- The following portions of the patient's history were reviewed and updated as appropriate: allergies, current medications, past family history, past medical history, past social history, past surgical history and problem list. Problem list updated.   Objective  Last menstrual period 08/12/2018, not currently breastfeeding. Pregravid weight 256 lb (116.1 kg) Total Weight Gain 21 lb (9.526 kg)   Urinalysis:      Fetal Status:     Movement: Present     General:  Alert, oriented and cooperative. Patient is in no acute distress.  Skin: Skin is warm and dry. No rash noted.   Cardiovascular: Normal heart rate noted  Respiratory: Normal respiratory effort, no problems with respiration noted  Abdomen: Soft, gravid, appropriate for gestational age. Pain/Pressure: Absent     Pelvic:  Cervical exam deferred        Extremities: Normal range of motion.     ental Status: Normal mood and affect. Normal behavior. Normal judgment and thought content.   Baseline: 150 Variability: moderate Accelerations: present Decelerations: absent The patient was monitored for 30 minutes, fetal heart rate tracing  was deemed reactive, category I tracing  Date Fasting Breakfast Lunch Dinner  7/20 115 98 103 134  7/21 104 92 108 118  7/22 103 99 120 129  7/23 106 119 103 102  7/24 98 98 112 102  7/25 106 95 110 138  7/26 109 97 118 117  7/27 105 106 135 134    Assessment   37 y.o. G5P1031 at [redacted]w[redacted]d by  05/19/2019, by Last Menstrual Period presenting for routine prenatal visit  Plan   Pregnancy#5 Problems (from 08/12/18 to present)    Problem Noted Resolved   Supervision of high risk pregnancy, antepartum 01/14/2017 by Doristine Counter, CMA No   Priority:  High     Overview Addendum 02/28/2019  8:06 AM by Malachy Mood, MD     Clinic [facility] Prenatal Labs  Dating  Blood type: O negative  Genetic Screen NIPS: Normal XX, Inheritest negative Antibody: Negative  Anatomic [place] Normal Rubella: Immune Varicella: Immune  GTT 116-early; 28 wk 180 3-hr 106 / 204 / 151 / 113 RPR: Immune  Rhogam [X]  28 weeks HBsAg: negative  TDaP vaccine [ ]  30 weeks   HIV: negative  Baby Food Bottle                 GBS:   Contraception POPs Pap: 01/14/17 NIL HPV negative  Influenza Declined 3/9       Support Person Husband: [name] History of polyhydramnios last preganacy    HIgh risk for obesity with BMI=40, CHTN, habitual abortions, AMA, and anxiety/depression        Gestational diabetes 02/28/2019 by Malachy Mood, MD No   History of polyhydramnios 10/07/2018 by Malachy Mood, MD No   Maternal obesity, antepartum 01/18/2017 by Dalia Heading, CNM No   Overview Addendum 02/22/2019  9:28 AM by Malachy Mood, MD  BMI >=40 [X]  bASA (>12 weeks) [ ]  Growth u/s 28 [X]  1,154g (2lbs 9oz) c/w 55.2%ile, 32 [ ] , 36 weeks [ ]  [ ]  NST/AFI weekly 36+ weeks (36[] , 37[] , 38[] , 39[] , 40[] ) [ ]  IOL by 41 weeks (scheduled, prn [] )        Chronic hypertension affecting pregnancy 01/18/2017 by Farrel ConnersGutierrez, Colleen, CNM No   Overview Addendum 03/22/2019  7:55 AM by Vena AustriaStaebler, Luisdavid Hamblin, MD    Monitor blood pressures. CMP  and TSH WNL. Baseline PC ratio 98 mgm  [X]  Aspirin 81 mg daily after 12 weeks; discontinue after 36 weeks [X]  baseline labs with CBC, CMP, urine protein/creatinine ratio [ ]  no BP meds unless BPs become elevated [ ]  ultrasound for growth at [X]  28 week 2lbs 9oz or 1154g c/w 55.2%ile, [x]  32 week 5lbs 1oz or 2283g c/w 78.9%ile AFI 18.3cm, 36 [ ]  weeks   Current antihypertensives:  None   Baseline and surveillance labs [facility] refresh links as needed)  Lab Results  Component Value Date   PLT 283 01/23/2017   CREATININE 0.65 01/23/2017   AST 11 01/23/2017   ALT 7 01/23/2017    Antenatal Testing CHTN - O10.919  Group I  BP < 140/90, no preeclampsia, AGA,  nml AFV, +/- meds    Group II BP > 140/90, on meds, no preeclampsia, AGA, nml AFV  20-28-34-38  20-24-28-32-35-38  32//2 x wk  28//BPP wkly then 32//2 x wk  40 no meds; 39 meds  PRN or 6137             Gestational age appropriate obstetric precautions including but not limited to vaginal bleeding, contractions, leaking of fluid and fetal movement were reviewed in detail with the patient.    - start insulin Lantus 28 units at bedtime, short acting 4 units with meals  Return in about 3 days (around 04/08/2019) for ROB, NST 3 days, ROB/NST/AFI 7 days.  Vena AustriaAndreas Liani Caris, MD, Evern CoreFACOG [facility], [facility] 04/05/2019, 1:05 PM

## 2019-04-05 NOTE — Progress Notes (Signed)
No vb. No lof. Growth scan today. NST .

## 2019-04-06 NOTE — Telephone Encounter (Signed)
Pt has questions about insulin she just picked up. AMS said she could come by office & he would show her how it works, but she's in [employer]. She watched a video on it. She's hoping she can figure it out. She is wondering if she needs to prime it every time? [phone]

## 2019-04-08 ENCOUNTER — Other Ambulatory Visit: Payer: Self-pay

## 2019-04-08 ENCOUNTER — Encounter: Payer: Self-pay | Admitting: Obstetrics and Gynecology

## 2019-04-08 ENCOUNTER — Ambulatory Visit (INDEPENDENT_AMBULATORY_CARE_PROVIDER_SITE_OTHER): Payer: BLUE CROSS/BLUE SHIELD | Admitting: Obstetrics and Gynecology

## 2019-04-08 VITALS — BP 132/80 | Wt 273.2 lb

## 2019-04-08 DIAGNOSIS — O24414 Gestational diabetes mellitus in pregnancy, insulin controlled: Secondary | ICD-10-CM

## 2019-04-08 DIAGNOSIS — O0990 Supervision of high risk pregnancy, unspecified, unspecified trimester: Secondary | ICD-10-CM

## 2019-04-08 DIAGNOSIS — Z3A34 34 weeks gestation of pregnancy: Secondary | ICD-10-CM | POA: Diagnosis not present

## 2019-04-08 DIAGNOSIS — O10913 Unspecified pre-existing hypertension complicating pregnancy, third trimester: Secondary | ICD-10-CM | POA: Diagnosis not present

## 2019-04-08 DIAGNOSIS — O0993 Supervision of high risk pregnancy, unspecified, third trimester: Secondary | ICD-10-CM

## 2019-04-08 NOTE — Progress Notes (Signed)
ROB/NST- no concerns 

## 2019-04-08 NOTE — Progress Notes (Signed)
Routine Prenatal Care Visit  Subjective  Tiffany Chapman is a 38 y.o. G5P1031 at 7764w1d being seen today for ongoing prenatal care.  She is currently monitored for the following issues for this high-risk pregnancy and has Supervision of high risk pregnancy, antepartum; Maternal obesity, antepartum; BMI 40.0-44.9, adult ([facility]); Chronic hypertension affecting pregnancy; Anxiety and depression; History of polyhydramnios; and Insulin controlled gestational diabetes mellitus (GDM) in third trimester on their problem list.  ----------------------------------------------------------------------------------- Patient reports no complaints.   Contractions: Not present. Vag. Bleeding: None.  Movement: Present. Denies leaking of fluid.  ----------------------------------------------------------------------------------- The following portions of the patient's history were reviewed and updated as appropriate: allergies, current medications, past family history, past medical history, past social history, past surgical history and problem list. Problem list updated.   Objective  Blood pressure 132/80, weight 273 lb 3.2 oz (123.9 kg), last menstrual period 08/12/2018, not currently breastfeeding. Pregravid weight 256 lb (116.1 kg) Total Weight Gain 17 lb 3.2 oz (7.802 kg) Urinalysis:      Fetal Status: Fetal Heart Rate (bpm): 135   Movement: Present     General:  Alert, oriented and cooperative. Patient is in no acute distress.  Skin: Skin is warm and dry. No rash noted.   Cardiovascular: Normal heart rate noted  Respiratory: Normal respiratory effort, no problems with respiration noted  Abdomen: Soft, gravid, appropriate for gestational age. Pain/Pressure: Absent     Pelvic:  Cervical exam deferred        Extremities: Normal range of motion.     Mental Status: Normal mood and affect. Normal behavior. Normal judgment and thought content.     Assessment   38 y.o. [id number] at 2664w1d by  05/19/2019, by  Last Menstrual Period presenting for routine prenatal visit  Plan   Pregnancy#5 Problems (from 08/12/18 to present)    Problem Noted Resolved   Insulin controlled gestational diabetes mellitus (GDM) in third trimester 02/28/2019 by Vena AustriaStaebler, Andreas, MD No   Overview Signed 04/05/2019 12:31 PM by Vena AustriaStaebler, Andreas, MD    Insulin start at 33 weeks 28 units lantus at bedtime, 4 units short acting with meals      History of polyhydramnios 10/07/2018 by Vena AustriaStaebler, Andreas, MD No   Maternal obesity, antepartum 01/18/2017 by Farrel ConnersGutierrez, Colleen, CNM No   Overview Addendum 02/22/2019  9:28 AM by Vena AustriaStaebler, Andreas, MD    BMI >=40 [X]  bASA (>12 weeks) [ ]  Growth u/s 28 [X]  1,154g (2lbs 9oz) c/w 55.2%ile, 32 [ ] , 36 weeks [ ]  [ ]  NST/AFI weekly 36+ weeks (36[] , 37[] , 38[] , 39[] , 40[] ) [ ]  IOL by 41 weeks (scheduled, prn [] )        Chronic hypertension affecting pregnancy 01/18/2017 by Farrel ConnersGutierrez, Colleen, CNM No   Overview Addendum 03/22/2019  7:55 AM by Vena AustriaStaebler, Andreas, MD    Monitor blood pressures. CMP and TSH WNL. Baseline PC ratio 98 mgm  [X]  Aspirin 81 mg daily after 12 weeks; discontinue after 36 weeks [X]  baseline labs with CBC, CMP, urine protein/creatinine ratio [ ]  no BP meds unless BPs become elevated [ ]  ultrasound for growth at [X]  28 week 2lbs 9oz or 1154g c/w 55.2%ile, [x]  32 week 5lbs 1oz or 2283g c/w 78.9%ile AFI 18.3cm, 36 [ ]  weeks   Current antihypertensives:  None   Baseline and surveillance labs [facility] refresh links as needed)  Lab Results  Component Value Date   PLT 283 01/23/2017   CREATININE 0.65 01/23/2017   AST 11 01/23/2017  ALT 7 01/23/2017    Antenatal Testing CHTN - O10.919  Group I  BP < 140/90, no preeclampsia, AGA,  nml AFV, +/- meds    Group II BP > 140/90, on meds, no preeclampsia, AGA, nml AFV  20-28-34-38  20-24-28-32-35-38  32//2 x wk  28//BPP wkly then 32//2 x wk  40 no meds; 39 meds  PRN or 37         Supervision of  high risk pregnancy, antepartum 01/14/2017 by Doristine Counter, CMA No   Overview Addendum 02/28/2019  8:06 AM by Malachy Mood, MD     Clinic [facility] Prenatal Labs  Dating  Blood type: O negative  Genetic Screen NIPS: Normal XX, Inheritest negative Antibody: Negative  Anatomic [place] Normal Rubella: Immune Varicella: Immune  GTT 116-early; 28 wk 180 3-hr 106 / 204 / 151 / 113 RPR: Immune  Rhogam [X]  28 weeks HBsAg: negative  TDaP vaccine [ ]  30 weeks   HIV: negative  Baby Food Bottle                 GBS:   Contraception POPs Pap: 01/14/17 NIL HPV negative  Influenza Declined 3/9       Support Person Husband: [name] History of polyhydramnios last preganacy    HIgh risk for obesity with BMI=40, CHTN, habitual abortions, AMA, and anxiety/depression            Gestational age appropriate obstetric precautions including but not limited to vaginal bleeding, contractions, leaking of fluid and fetal movement were reviewed in detail with the patient.    NST: 135 bpm baseline, moderate variability, 15x15 accelerations, no decelerations. Reviewed log. Patient just initiated insulin on Wednesday. Continued to have elevated fating but other values WNL.     Return in about 3 days (around 04/11/2019).  Homero Fellers MD [facility], [facility] 04/08/2019, 4:38 PM

## 2019-04-12 ENCOUNTER — Ambulatory Visit (INDEPENDENT_AMBULATORY_CARE_PROVIDER_SITE_OTHER): Payer: BLUE CROSS/BLUE SHIELD

## 2019-04-12 ENCOUNTER — Other Ambulatory Visit: Payer: Self-pay

## 2019-04-12 ENCOUNTER — Ambulatory Visit (INDEPENDENT_AMBULATORY_CARE_PROVIDER_SITE_OTHER): Payer: BLUE CROSS/BLUE SHIELD | Admitting: Obstetrics and Gynecology

## 2019-04-12 VITALS — BP 142/90 | Wt 278.0 lb

## 2019-04-12 DIAGNOSIS — O10913 Unspecified pre-existing hypertension complicating pregnancy, third trimester: Secondary | ICD-10-CM | POA: Diagnosis not present

## 2019-04-12 DIAGNOSIS — Z3A34 34 weeks gestation of pregnancy: Secondary | ICD-10-CM

## 2019-04-12 DIAGNOSIS — O09293 Supervision of pregnancy with other poor reproductive or obstetric history, third trimester: Secondary | ICD-10-CM | POA: Diagnosis not present

## 2019-04-12 DIAGNOSIS — O99213 Obesity complicating pregnancy, third trimester: Secondary | ICD-10-CM

## 2019-04-12 DIAGNOSIS — O99210 Obesity complicating pregnancy, unspecified trimester: Secondary | ICD-10-CM

## 2019-04-12 DIAGNOSIS — O10919 Unspecified pre-existing hypertension complicating pregnancy, unspecified trimester: Secondary | ICD-10-CM

## 2019-04-12 DIAGNOSIS — O0993 Supervision of high risk pregnancy, unspecified, third trimester: Secondary | ICD-10-CM

## 2019-04-12 DIAGNOSIS — O0990 Supervision of high risk pregnancy, unspecified, unspecified trimester: Secondary | ICD-10-CM

## 2019-04-12 DIAGNOSIS — O24410 Gestational diabetes mellitus in pregnancy, diet controlled: Secondary | ICD-10-CM | POA: Diagnosis not present

## 2019-04-12 DIAGNOSIS — Z8759 Personal history of other complications of pregnancy, childbirth and the puerperium: Secondary | ICD-10-CM

## 2019-04-12 DIAGNOSIS — Z6841 Body Mass Index (BMI) 40.0 and over, adult: Secondary | ICD-10-CM

## 2019-04-12 DIAGNOSIS — O24414 Gestational diabetes mellitus in pregnancy, insulin controlled: Secondary | ICD-10-CM | POA: Diagnosis not present

## 2019-04-12 LAB — POCT URINALYSIS DIPSTICK OB: Glucose, UA: NEGATIVE

## 2019-04-12 NOTE — Progress Notes (Signed)
ROB AFI/NST 

## 2019-04-12 NOTE — Progress Notes (Signed)
Routine Prenatal Care Visit  Subjective  Tiffany Chapman is a 38 y.o. G5P1031 at 9312w5d being seen today for ongoing prenatal care.  She is currently monitored for the following issues for this high-risk pregnancy and has Supervision of high risk pregnancy, antepartum; Maternal obesity, antepartum; BMI 40.0-44.9, adult ([facility]); Chronic hypertension affecting pregnancy; Anxiety and depression; History of polyhydramnios; and Insulin controlled gestational diabetes mellitus (GDM) in third trimester on their problem list.  ----------------------------------------------------------------------------------- Patient reports no complaints.   Contractions: Not present. Vag. Bleeding: None.  Movement: Present. Denies leaking of fluid.  ----------------------------------------------------------------------------------- The following portions of the patient's history were reviewed and updated as appropriate: allergies, current medications, past family history, past medical history, past social history, past surgical history and problem list. Problem list updated.   Objective  Blood pressure (!) 142/90, weight 278 lb (126.1 kg), last menstrual period 08/12/2018, not currently breastfeeding. Pregravid weight 256 lb (116.1 kg) Total Weight Gain 22 lb (9.979 kg) Urinalysis:      Fetal Status: Fetal Heart Rate (bpm): 150   Movement: Present  Presentation: Transverse  General:  Alert, oriented and cooperative. Patient is in no acute distress.  Skin: Skin is warm and dry. No rash noted.   Cardiovascular: Normal heart rate noted  Respiratory: Normal respiratory effort, no problems with respiration noted  Abdomen: Soft, gravid, appropriate for gestational age. Pain/Pressure: Absent     Pelvic:  Cervical exam deferred        Extremities: Normal range of motion.     ental Status: Normal mood and affect. Normal behavior. Normal judgment and thought content.   Baseline: 150 Variability: moderate Accelerations:  present Decelerations: absent The patient was monitored for 30 minutes, fetal heart rate tracing was deemed reactive, category I tracing,  [place] Ob Limited  Result Date: 04/12/2019 Patient Name: Tiffany Chapman DOB: 01/20/1981 MRN: [id number] ULTRASOUND REPORT Location: [facility] Date of Service: 04/12/2019 Indications:AFI Findings: Mason JimSingleton intrauterine pregnancy is visualized with FHR at 154 BPM. Fetal presentation is Transverse. Placenta: anterior. Grade: 1 AFI: 21.5 cm Impression: 1. 5912w5d Viable Singleton Intrauterine pregnancy dated by previously established criteria. 2. AFI is 21.5 cm. Recommendations: 1.Clinical correlation with the patient's History and Physical Exam. Deanna ArtisElyse S Fairbanks, RT There is a singleton gestation with normal amniotic fluid volume. TThe visualized fetal anatomical survey appears within normal limits within the resolution of ultrasound as described above.  It must be noted that a normal ultrasound is unable to rule out fetal aneuploidy.  Vena AustriaAndreas Peregrine Nolt, MD, Merlinda FrederickFACOG [facility], [facility] 04/12/2019, 9:27 AM\  [place] Ob Limited  Result Date: 04/05/2019 Patient Name: Tiffany Paddockshleigh V Nydam DOB: 10/29/1980 MRN: [id number] ULTRASOUND REPORT Location: [facility] Date of Service: 04/05/2019 Indications:AFI Findings: Mason JimSingleton intrauterine pregnancy is visualized with FHR at 135 BPM. Fetal presentation is Transverse. Placenta: anterior. Grade: 1 AFI: 17.7 cm Impression: 1. 6178w5d Viable Singleton Intrauterine pregnancy dated by previously established criteria. 2. AFI is 17.7 cm. 3. The fetal bladder appears distended measuring 50.1 x 27.6 x 28.3 mm. Recommendations: 1.Clinical correlation with the patient's History and Physical Exam. Deanna ArtisElyse S Fairbanks, RT There is a singleton gestation with normal amniotic fluid volume. The fetal biometry correlates with established dating.  Limited fetal anatomy was performed.The visualized fetal anatomical survey appears within normal limits  within the resolution of ultrasound as described above.   Fetal bladder normal on all prior scan will monitor.   It must be noted that a normal ultrasound is unable to rule out  fetal aneuploidy.  Malachy Mood, MD, [facility], [facility] 04/05/2019, 1:12 PM  US Ob Follow Up  Result Date: 03/21/2019 Patient Name: Tiffany Chapman DOB: August 06, 1981 MRN: [id number] ULTRASOUND REPORT Location: [facility] Date of Service: 03/21/2019 Indications:growth/afi Findings: [name] intrauterine pregnancy is visualized with FHR at 155 BPM. Biometrics give an (U/S) Gestational age of [redacted]w[redacted]d and an (U/S) EDD of 05/08/2019; this correlates with the clinically established Estimated Date of Delivery: 05/19/19. Fetal presentation is transverse. Placenta: anterior. Grade: 1 AFI: 18.3 cm Growth percentile is 78.9. EFW: 2283 g (5 lb 1 oz ) Impression: 1. [redacted]w[redacted]d Viable Singleton Intrauterine pregnancy previously established criteria. 2. Growth is 78.9 %ile.  AFI is 18.3 cm. Recommendations: 1.Clinical correlation with the patient's History and Physical Exam. Gweneth Dimitri, RT There is a singleton gestation with normal amniotic fluid volume. The fetal biometry correlates with established dating.  Limited fetal anatomy was performed.The visualized fetal anatomical survey appears within normal limits within the resolution of ultrasound as described above.  It must be noted that a normal ultrasound is unable to rule out fetal aneuploidy.  Malachy Mood, MD, [facility], [facility] 03/21/2019, 11:00 AM    Date Fasting Breakfast Lunch Dinner  7/30 104 113 106 111  7/31 104 129 114 131  8/1 93 88 129 139  8/2 94 90 117 118  8/3 109 104 110 126  8/4 103        Assessment   38 y.o. G5P1031 at [redacted]w[redacted]d by  05/19/2019, by Last Menstrual Period presenting for routine prenatal visit  Plan   Pregnancy#5 Problems (from 08/12/18 to present)    Problem Noted Resolved   Supervision  of high risk pregnancy, antepartum 01/14/2017 by Doristine Counter, CMA No   Priority:  High     Overview Addendum 02/28/2019  8:06 AM by Malachy Mood, MD     Clinic [facility] Prenatal Labs  Dating  Blood type: O negative  Genetic Screen NIPS: Normal XX, Inheritest negative Antibody: Negative  Anatomic [place] Normal Rubella: Immune Varicella: Immune  GTT 116-early; 28 wk 180 3-hr 106 / 204 / 151 / 113 RPR: Immune  Rhogam [X]  28 weeks HBsAg: negative  TDaP vaccine [ ]  30 weeks   HIV: negative  Baby Food Bottle                 GBS:   Contraception POPs Pap: 01/14/17 NIL HPV negative  Influenza Declined 3/9       Support Person Husband: [name] History of polyhydramnios last preganacy    HIgh risk for obesity with BMI=40, CHTN, habitual abortions, AMA, and anxiety/depression        Insulin controlled gestational diabetes mellitus (GDM) in third trimester 02/28/2019 by Malachy Mood, MD No   Overview Signed 04/05/2019 12:31 PM by Malachy Mood, MD    Insulin start at 33 weeks 28 units lantus at bedtime, 4 units short acting with meals      History of polyhydramnios 10/07/2018 by Malachy Mood, MD No   Maternal obesity, antepartum 01/18/2017 by Dalia Heading, CNM No   Overview Addendum 02/22/2019  9:28 AM by Malachy Mood, MD    BMI >=40 [X]  bASA (>12 weeks)         Chronic hypertension affecting pregnancy 01/18/2017 by Dalia Heading, CNM No   Overview Addendum 03/22/2019  7:55 AM by Malachy Mood, MD    Monitor blood pressures. CMP and TSH WNL. Baseline PC ratio 98 mgm  [X]   Aspirin 81 mg daily after 12 weeks; discontinue after 36 weeks [X]  baseline labs with CBC, CMP, urine protein/creatinine ratio [ ]  no BP meds unless BPs become elevated [ ]  ultrasound for growth at [X]  28 week 2lbs 9oz or 1154g c/w 55.2%ile, [x]  32 week 5lbs 1oz or 2283g c/w 78.9%ile AFI 18.3cm, 36 [ ]  weeks   Current antihypertensives:  None   Baseline and surveillance labs ([facility], refresh links as needed)  Lab Results  Component Value Date   PLT 283 01/23/2017   CREATININE 0.65 01/23/2017   AST 11 01/23/2017   ALT 7 01/23/2017    Antenatal Testing CHTN - O10.919  Group I  BP < 140/90, no preeclampsia, AGA,  nml AFV, +/- meds    Group II BP > 140/90, on meds, no preeclampsia, AGA, nml AFV  20-28-34-38  20-24-28-32-35-38  32//2 x wk  28//BPP wkly then 32//2 x wk  40 no meds; 39 meds  PRN or 1737             Gestational age appropriate obstetric precautions including but not limited to vaginal bleeding, contractions, leaking of fluid and fetal movement were reviewed in detail with the patient.    1) Delivery Scheduling -  verify C-section scheduled 05/12/2019  2) Gestational Diabetes  - Increase lantus to 34 units from 28 units, maintain meal time insulin dose - reactive NST, normal AFI - Growth scan next week.  Depending on BG control, blood pressures, and growth may need move up delivery timing  3) CHTN - mild range BP today.  Has been running normotensive.  Monitor for need to restart labetalol   Return in 3 days (on 04/15/2019) for ROB NST 8/7; ROB NST/Growth Scan 8/11.  Vena AustriaAndreas Tahnee Cifuentes, MD, Evern CoreFACOG [facility], [facility] 04/12/2019, 10:01 AM

## 2019-04-15 ENCOUNTER — Observation Stay
Admission: EM | Admit: 2019-04-15 | Discharge: 2019-04-15 | Disposition: A | Discharge status: Home / Self Care | Payer: BLUE CROSS/BLUE SHIELD | Attending: Obstetrics & Gynecology | Admitting: Obstetrics & Gynecology

## 2019-04-15 ENCOUNTER — Other Ambulatory Visit: Payer: Self-pay

## 2019-04-15 ENCOUNTER — Ambulatory Visit (INDEPENDENT_AMBULATORY_CARE_PROVIDER_SITE_OTHER): Payer: BLUE CROSS/BLUE SHIELD | Admitting: Obstetrics and Gynecology

## 2019-04-15 VITALS — BP 150/88 | Wt 278.0 lb

## 2019-04-15 DIAGNOSIS — Z79899 Other long term (current) drug therapy: Secondary | ICD-10-CM | POA: Insufficient documentation

## 2019-04-15 DIAGNOSIS — Z8759 Personal history of other complications of pregnancy, childbirth and the puerperium: Secondary | ICD-10-CM

## 2019-04-15 DIAGNOSIS — O0990 Supervision of high risk pregnancy, unspecified, unspecified trimester: Secondary | ICD-10-CM

## 2019-04-15 DIAGNOSIS — O10913 Unspecified pre-existing hypertension complicating pregnancy, third trimester: Secondary | ICD-10-CM

## 2019-04-15 DIAGNOSIS — Z3A35 35 weeks gestation of pregnancy: Secondary | ICD-10-CM | POA: Insufficient documentation

## 2019-04-15 DIAGNOSIS — Z8249 Family history of ischemic heart disease and other diseases of the circulatory system: Secondary | ICD-10-CM | POA: Diagnosis not present

## 2019-04-15 DIAGNOSIS — O24414 Gestational diabetes mellitus in pregnancy, insulin controlled: Secondary | ICD-10-CM

## 2019-04-15 DIAGNOSIS — O10919 Unspecified pre-existing hypertension complicating pregnancy, unspecified trimester: Secondary | ICD-10-CM

## 2019-04-15 DIAGNOSIS — Z7982 Long term (current) use of aspirin: Secondary | ICD-10-CM | POA: Diagnosis not present

## 2019-04-15 DIAGNOSIS — Z833 Family history of diabetes mellitus: Secondary | ICD-10-CM | POA: Diagnosis not present

## 2019-04-15 DIAGNOSIS — Z87891 Personal history of nicotine dependence: Secondary | ICD-10-CM | POA: Diagnosis not present

## 2019-04-15 DIAGNOSIS — O0993 Supervision of high risk pregnancy, unspecified, third trimester: Secondary | ICD-10-CM

## 2019-04-15 DIAGNOSIS — O99213 Obesity complicating pregnancy, third trimester: Secondary | ICD-10-CM

## 2019-04-15 DIAGNOSIS — Z794 Long term (current) use of insulin: Secondary | ICD-10-CM | POA: Diagnosis not present

## 2019-04-15 DIAGNOSIS — O99210 Obesity complicating pregnancy, unspecified trimester: Secondary | ICD-10-CM

## 2019-04-15 DIAGNOSIS — O09293 Supervision of pregnancy with other poor reproductive or obstetric history, third trimester: Secondary | ICD-10-CM

## 2019-04-15 LAB — CBC
HCT: 34.2 % — ABNORMAL LOW (ref 36.0–46.0)
Hemoglobin: 11.5 g/dL — ABNORMAL LOW (ref 12.0–15.0)
MCH: 32.3 pg (ref 26.0–34.0)
MCHC: 33.6 g/dL (ref 30.0–36.0)
MCV: 96.1 fL (ref 80.0–100.0)
Platelets: 230 10*3/uL (ref 150–400)
RBC: 3.56 MIL/uL — ABNORMAL LOW (ref 3.87–5.11)
RDW: 13.2 % (ref 11.5–15.5)
WBC: 10 10*3/uL (ref 4.0–10.5)
nRBC: 0 % (ref 0.0–0.2)

## 2019-04-15 LAB — POCT URINALYSIS DIPSTICK OB
Glucose, UA: NEGATIVE
POC,PROTEIN,UA: NEGATIVE

## 2019-04-15 LAB — COMPREHENSIVE METABOLIC PANEL
ALT: 19 U/L (ref 0–44)
AST: 18 U/L (ref 15–41)
Albumin: 2.9 g/dL — ABNORMAL LOW (ref 3.5–5.0)
Alkaline Phosphatase: 110 U/L (ref 38–126)
Anion gap: 9 (ref 5–15)
BUN: 14 mg/dL (ref 6–20)
CO2: 21 mmol/L — ABNORMAL LOW (ref 22–32)
Calcium: 8.9 mg/dL (ref 8.9–10.3)
Chloride: 106 mmol/L (ref 98–111)
Creatinine, Ser: 0.53 mg/dL (ref 0.44–1.00)
GFR calc Af Amer: >60 mL/min (ref >60)
GFR calc non Af Amer: >60 mL/min (ref >60)
Glucose, Bld: 122 mg/dL — ABNORMAL HIGH (ref 70–99)
Potassium: 4.1 mmol/L (ref 3.5–5.1)
Sodium: 136 mmol/L (ref 135–145)
Total Bilirubin: 0.5 mg/dL (ref 0.3–1.2)
Total Protein: 6.1 g/dL — ABNORMAL LOW (ref 6.5–8.1)

## 2019-04-15 LAB — PROTEIN / CREATININE RATIO, URINE
Creatinine, Urine: 76 mg/dL
Protein Creatinine Ratio: 0.16 mg/mg{Cre} — ABNORMAL HIGH (ref 0.00–0.15)
Total Protein, Urine: 12 mg/dL

## 2019-04-15 NOTE — Progress Notes (Signed)
Routine Prenatal Care Visit  Subjective  Tiffany Chapman is a 38 y.o. G2R4270 at 104w1d being seen today for ongoing prenatal care.  She is currently monitored for the following issues for this high-risk pregnancy and has Supervision of high risk pregnancy, antepartum; Maternal obesity, antepartum; BMI 40.0-44.9, adult ([place]); Chronic hypertension affecting pregnancy; Anxiety and depression; History of polyhydramnios; and Insulin controlled gestational diabetes mellitus (GDM) in third trimester on their problem list.  ----------------------------------------------------------------------------------- Patient reports no complaints.   Contractions: Not present. Vag. Bleeding: None.  Movement: Present. Denies leaking of fluid.  ----------------------------------------------------------------------------------- The following portions of the patient's history were reviewed and updated as appropriate: allergies, current medications, past family history, past medical history, past social history, past surgical history and problem list. Problem list updated.   Objective  Blood pressure (!) 150/88, weight 278 lb (126.1 kg), last menstrual period 08/12/2018, not currently breastfeeding. Pregravid weight 256 lb (116.1 kg) Total Weight Gain 22 lb (9.979 kg) Urinalysis:      Fetal Status: Fetal Heart Rate (bpm): 150   Movement: Present  Presentation: Transverse  General:  Alert, oriented and cooperative. Patient is in no acute distress.  Skin: Skin is warm and dry. No rash noted.   Cardiovascular: Normal heart rate noted  Respiratory: Normal respiratory effort, no problems with respiration noted  Abdomen: Soft, gravid, appropriate for gestational age. Pain/Pressure: Absent     Pelvic:  Cervical exam deferred        Extremities: Normal range of motion.     ental Status: Normal mood and affect. Normal behavior. Normal judgment and thought content.   Date Fasting Breakfast Lunch Dinner  8/5 90 104 117  141  8/6 103 102 97 115  8/7 92 104     Baseline: 150 Variability: moderate Accelerations: present Decelerations: absent The patient was monitored for 30 minutes, fetal heart rate tracing was deemed reactive, category I tracing,  Immunization History  Administered Date(s) Administered  . Influenza,inj,Quad PF,6+ Mos 06/18/2017  . Tdap 07/02/2017      Assessment   38 y.o. [id number] at [redacted]w[redacted]d by  05/19/2019, by Last Menstrual Period presenting for routine prenatal visit  Plan   Pregnancy#5 Problems (from 08/12/18 to present)    Problem Noted Resolved   Supervision of high risk pregnancy, antepartum 01/14/2017 by Doristine Counter, CMA No   Priority:  High     Overview Addendum 02/28/2019  8:06 AM by Malachy Mood, MD     Clinic [facility] Prenatal Labs  Dating LMP = 9 week [place] Blood type: O negative  Genetic Screen NIPS: Normal XX, Inheritest negative Antibody: Negative  Anatomic [place] Normal Rubella: Immune Varicella: Immune  GTT 116-early; 28 wk 180 3-hr 106 / 204 / 151 / 113 RPR: Immune  Rhogam [X]  28 weeks HBsAg: negative  TDaP vaccine [ ]  30 weeks   HIV: negative  Baby Food Bottle                 GBS:   Contraception POPs Pap: 01/14/17 NIL HPV negative  Influenza Declined 3/9       Support Person Husband: [name] History of polyhydramnios last preganacy    HIgh risk for obesity with BMI=40, CHTN, habitual abortions, AMA, and anxiety/depression        Insulin controlled gestational diabetes mellitus (GDM) in third trimester 02/28/2019 by Malachy Mood, MD No   Overview Addendum 04/13/2019  1:03 PM by Malachy Mood, MD    Insulin start at 33 weeks 28 units lantus  at bedtime, 4 units short acting with meals - increased to 34 units lantus 4 units aspart with meals (04/12/2019)      History of polyhydramnios 10/07/2018 by Vena AustriaStaebler, Theodis Kinsel, MD No   Maternal obesity, antepartum 01/18/2017 by Farrel ConnersGutierrez, Colleen, CNM No   Overview Addendum 04/12/2019  6:09 PM by Vena AustriaStaebler, Emani Morad, MD     BMI >=40 [X]  bASA (>12 weeks)       Chronic hypertension affecting pregnancy 01/18/2017 by Farrel ConnersGutierrez, Colleen, CNM No   Overview Addendum 03/22/2019  7:55 AM by Vena AustriaStaebler, Lajune Perine, MD    Monitor blood pressures. CMP and TSH WNL. Baseline PC ratio 98 mgm  [X]  Aspirin 81 mg daily after 12 weeks; discontinue after 36 weeks [X]  baseline labs with CBC, CMP, urine protein/creatinine ratio [ ]  no BP meds unless BPs become elevated [ ]  ultrasound for growth at [X]  28 week 2lbs 9oz or 1154g c/w 55.2%ile, [x]  32 week 5lbs 1oz or 2283g c/w 78.9%ile AFI 18.3cm, 36 [ ]  weeks   Current antihypertensives:  None   [facility] refresh [facility] as needed)  Lab Results  Component Value Date   PLT 283 01/23/2017   CREATININE 0.65 01/23/2017   AST 11 01/23/2017   ALT 7 01/23/2017    Antenatal Testing CHTN - O10.919  Group I  BP < 140/90, no preeclampsia, AGA,  nml AFV, +/- meds    Group II BP > 140/90, on meds, no preeclampsia, AGA, nml AFV  20-28-34-38  20-24-28-32-35-38  32//2 x wk  28//BPP wkly then 32//2 x wk  40 no meds; 39 meds  PRN or [id number]             Gestational age appropriate obstetric precautions including but not limited to vaginal bleeding, contractions, leaking of fluid and fetal movement were reviewed in detail with the patient.    - CHTN but with sudden increase in BP today.  Currently not on any antihypertensive.  To L&D for labs if normal has follow up growth scan next week and NST 04/19/2019 - GDM well controlled with increase in lantus no changes  Return in about 4 days (around 04/19/2019) for NST and ROB at time of ultrasound 8/11.  Vena AustriaAndreas Zariana Strub, MD, Evern CoreFACOG [facility], [facility] 04/15/2019, 2:37 PM

## 2019-04-15 NOTE — Progress Notes (Signed)
Pt presents from the office for increased BP. +FM. Denies LOF/CTX/bleeding. Reports having CHTN but does not take meds. Reports she has insulin controlled GDM. Monitors applied. Will continue to monitor

## 2019-04-15 NOTE — Addendum Note (Signed)
Addended by: [place], Allexis Bordenave B on: 04/15/2019 02:40 PM   Modules accepted: Orders

## 2019-04-15 NOTE — Progress Notes (Signed)
ROB NST 

## 2019-04-18 ENCOUNTER — Telehealth: Payer: Self-pay | Admitting: Obstetrics and Gynecology

## 2019-04-18 NOTE — Discharge Summary (Signed)
Physician Final Progress Note  Patient ID: Tiffany Chapman MRN: [id number] DOB/AGE: March 23, 1981 38 y.o.  Admit date: 04/15/2019 Admitting provider: Gae Dry, MD Discharge date: 04/15/2019  Admission Diagnoses: Chronic hypertension in pregnancy, rule out pre-eclampsia  Discharge Diagnoses: Chronic hypertension in pregnancy  History of Present Illness: The patient is a 38 y.o. female G5P1031 at 62w4dwith chronic hypertension and insulin controlled gestational diabetes who presents for elevated blood pressure in clinic at 150/88. She is not taking any medication for hypertension. She does not have a headache, visual changes, or epigastric pain. She reports positive fetal movement. She has not had any vaginal bleeding or loss of fluid.  Review of Systems: Review of systems negative unless otherwise noted in HPI.   Past Medical History:  Diagnosis Date  . Anxiety   . 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 Dysmenorrhea   . Gestational diabetes   . Habitual aborter, currently pregnant   . Hypertension    labetalol discontinued in December 2017  . Missed abortion 02/2016, 06/2016  . Obesity     Past Surgical History:  Procedure Laterality Date  . BREAST ENHANCEMENT SURGERY Bilateral   . CESAREAN SECTION N/A 08/25/2017   Procedure: CESAREAN SECTION;  Surgeon: SMalachy Mood MD;  Location: [facility] ORS;  Service: Obstetrics;  Laterality: N/A;  birth at 119weight 9lb 0oz apgar 9/9  . COLPOSCOPY  01/01/2012   Benign  . DILATION AND EVACUATION N/A 02/15/2016   Procedure: DILATATION AND EVACUATION;  Surgeon: AMalachy Mood MD;  Location: [facility] ORS;  Service: Gynecology;  Laterality: N/A;  . DILATION AND EVACUATION N/A 06/10/2016   Procedure: DILATATION AND EVACUATION;  Surgeon: AMalachy Mood MD;  Location: [facility] ORS;  Service: Gynecology;  Laterality: N/A;  . TONSILLECTOMY  age 38   No current facility-administered medications on file prior to encounter.     Current Outpatient Medications on File Prior to Encounter  Medication Sig Dispense Refill  . aspirin EC 81 MG tablet Take 1 tablet (81 mg total) by mouth daily. Take after 12 weeks for prevention of preeclampssia later in pregnancy 300 tablet 2  . insulin aspart (NOVOLOG) 100 UNIT/ML FlexPen Inject 4 Units into the skin 3 (three) times daily with meals. 15 mL 11  . Insulin Glargine (LANTUS) 100 UNIT/ML Solostar Pen Inject 28 Units into the skin at bedtime. 15 mL 11  . Prenatal Vit-Fe Fumarate-FA (PRENATAL VITAMINS PO) Take 1 tablet by mouth daily.    . Accu-Chek FastClix Lancets MISC 1 Units by Percutaneous route 4 (four) times daily. 100 each 12  . Blood Glucose Monitoring Suppl (ACCU-CHEK GUIDE) w/Device KIT 1 kit by Does not apply route as directed. 1 kit 0  . Doxylamine-Pyridoxine (DICLEGIS) 10-10 MG TBEC Take 2 tablets by mouth at bedtime. If symptoms persist, add one tablet in the morning and one in the afternoon (Patient not taking: Reported on 04/08/2019) 100 tablet 5  . escitalopram (LEXAPRO) 10 MG tablet Take 1 tablet (10 mg total) by mouth daily. 90 tablet 3  . glucose blood (ACCU-CHEK GUIDE) test strip Use as instructed 100 each 12  . Insulin Pen Needle (NOVOFINE) 32G X 6 MM MISC Use as directed 100 each 3  . metFORMIN (GLUCOPHAGE) 500 MG tablet Take 2 tablets (1,000 mg total) by mouth at bedtime. (Patient not taking: Reported on 04/08/2019) 30 tablet 11    No Known Allergies  Social History   Socioeconomic History  . Marital status: Married  Spouse name: Not on file  . Number of children: 0  . Years of education: Not on file  . Highest education level: Not on file  Occupational History  . Not on file  Social Needs  . Financial resource strain: Not on file  . Food insecurity    Worry: Not on file    Inability: Not on file  . Transportation needs    Medical: Not on file    Non-medical: Not on file  Tobacco Use  . Smoking status: Former Smoker    Packs/day: 0.50     Years: 15.00    Pack years: 7.50    Types: Cigarettes    Quit date: 12/30/2016    Years since quitting: 2.2  . Smokeless tobacco: Former User  Substance and Sexual Activity  . Alcohol use: No  . Drug use: No  . Sexual activity: Yes    Birth control/protection: None  Lifestyle  . Physical activity    Days per week: Not on file    Minutes per session: Not on file  . Stress: Not on file  Relationships  . Social connections    Talks on phone: Not on file    Gets together: Not on file    Attends religious service: Not on file    Active member of club or organization: Not on file    Attends meetings of clubs or organizations: Not on file    Relationship status: Not on file  . Intimate partner violence    Fear of current or ex partner: Not on file    Emotionally abused: Not on file    Physically abused: Not on file    Forced sexual activity: Not on file  Other Topics Concern  . Not on file  Social History Narrative   Andrew is FOB, and involved with care    Family history:  Family History  Problem Relation Age of Onset  . Diabetes Mother   . Multiple sclerosis Mother   . Hypertension Mother   . Lung cancer Maternal Grandmother 50  . Lung cancer Father        died at age 62  . Hypertension Father   . Diabetes Father     Physical Exam: BP 137/87   Pulse 100   Temp 98.5 F (36.9 C) (Oral)   Resp 16   LMP 08/12/2018 (Exact Date)   Gen: NAD Pulm: No increased work of breathing Pelvic: deferred Ext: no signs of DVT Psych: Mood appropriate, affect full  NST Baseline: 135 Variability: moderate Accelerations: present Decelerations: absent Tocometry: occasional contraction The patient was monitored for 30+ minutes, fetal heart rate tracing was deemed reactive.  Significant Findings/ Diagnostic Studies: labs: CBC and CMP negative for changes associated with pre-eclampsia, PC ratio was normal at  0.16  Procedures: NST  Discharge Condition: Good  Disposition:  Home  Diet: Diabetic diet  Discharge Activity: Activity as tolerated, encouraged rest and no strenuous activity  Discharge Instructions    Discharge activity:  No Restrictions   Complete by: As directed    Discharge diet:  No restrictions   Complete by: As directed    Fetal Kick Count:  Lie on our left side for one hour after a meal, and count the number of times your baby kicks.  If it is less than 5 times, get up, move around and drink some juice.  Repeat the test 30 minutes later.  If it is still less than 5 kicks in an hour, notify   your doctor.   Complete by: As directed    LABOR:  When conractions begin, you should start to time them from the beginning of one contraction to the beginning  of the next.  When contractions are 5 - 10 minutes apart or less and have been regular for at least an hour, you should call your health care provider.   Complete by: As directed    No sexual activity restrictions   Complete by: As directed    Notify physician for bleeding from the vagina   Complete by: As directed    Notify physician for blurring of vision or spots before the eyes   Complete by: As directed    Notify physician for chills or fever   Complete by: As directed    Notify physician for fainting spells, "black outs" or loss of consciousness   Complete by: As directed    Notify physician for increase in vaginal discharge   Complete by: As directed    Notify physician for leaking of fluid   Complete by: As directed    Notify physician for pain or burning when urinating   Complete by: As directed    Notify physician for pelvic pressure (sudden increase)   Complete by: As directed    Notify physician for severe or continued nausea or vomiting   Complete by: As directed    Notify physician for sudden gushing of fluid from the vagina (with or without continued leaking)   Complete by: As directed    Notify physician for sudden, constant, or occasional abdominal pain   Complete by: As  directed    Notify physician if baby moving less than usual   Complete by: As directed      Allergies as of 04/15/2019   No Known Allergies     Medication List    TAKE these medications   Accu-Chek FastClix Lancets Misc 1 Units by Percutaneous route 4 (four) times daily.   Accu-Chek Guide test strip Generic drug: glucose blood Use as instructed   Accu-Chek Guide w/Device Kit 1 kit by Does not apply route as directed.   aspirin EC 81 MG tablet Take 1 tablet (81 mg total) by mouth daily. Take after 12 weeks for prevention of preeclampssia later in pregnancy   Doxylamine-Pyridoxine 10-10 MG Tbec Commonly known as: Diclegis Take 2 tablets by mouth at bedtime. If symptoms persist, add one tablet in the morning and one in the afternoon   escitalopram 10 MG tablet Commonly known as: Lexapro Take 1 tablet (10 mg total) by mouth daily.   insulin aspart 100 UNIT/ML FlexPen Commonly known as: NOVOLOG Inject 4 Units into the skin 3 (three) times daily with meals.   Insulin Glargine 100 UNIT/ML Solostar Pen Commonly known as: LANTUS Inject 28 Units into the skin at bedtime.   metFORMIN 500 MG tablet Commonly known as: GLUCOPHAGE Take 2 tablets (1,000 mg total) by mouth at bedtime.   NovoFine 32G X 6 MM Misc Generic drug: Insulin Pen Needle Use as directed   PRENATAL VITAMINS PO Take 1 tablet by mouth daily.      After two initial mild range elevations in BP measurements on arrival, patient was normotensive in triage. Normal laboratory results, fetal well-being reassuring. After discussion, decision made not to start medication at this time.  Signed: Jacelyn Y Schmid, CNM  04/15/2019 

## 2019-04-18 NOTE — Telephone Encounter (Signed)
-----   Message from Malachy Mood, MD sent at 04/13/2019  1:01 PM EDT ----- Regarding: C-section Surgery Date: 05/12/2019  LOS: surgery admit  Surgery Booking Request Patient Full Name: Tiffany Chapman MRN: [id number]  DOB: 01/06/1981  Surgeon: Malachy Mood, MD  Requested Surgery Date and Time: 7:45 Primary Diagnosis and Code: History of prior cesarean section Secondary Diagnosis and Code:  Surgical Procedure: Cesarean Section L&D Notification:yes Admission Status: surgery admit Length of Surgery: 1hr Special Case Needs: none H&P: week of (date) Phone Interview or Office Pre-Admit: pre-admit Interpreter: No Language: English Medical Clearance: No Special Scheduling Instructions: none

## 2019-04-18 NOTE — Telephone Encounter (Signed)
Patient is aware of H&P on 05/03/19 @ 8:10am w/ Dr. Georgianne Fick, Pre-admit testing and covid testing to be scheduled, and OR on 05/12/19. Patient is aware she will be asked to quarantine after covid testing. Per patient, she is aware her [employer] plan is out-of-network.

## 2019-04-18 NOTE — Telephone Encounter (Signed)
Lmtrc

## 2019-04-19 ENCOUNTER — Ambulatory Visit (INDEPENDENT_AMBULATORY_CARE_PROVIDER_SITE_OTHER): Payer: BLUE CROSS/BLUE SHIELD | Admitting: Obstetrics and Gynecology

## 2019-04-19 ENCOUNTER — Other Ambulatory Visit: Payer: Self-pay

## 2019-04-19 ENCOUNTER — Other Ambulatory Visit: Payer: BLUE CROSS/BLUE SHIELD

## 2019-04-19 DIAGNOSIS — O24414 Gestational diabetes mellitus in pregnancy, insulin controlled: Secondary | ICD-10-CM

## 2019-04-19 DIAGNOSIS — O09523 Supervision of elderly multigravida, third trimester: Secondary | ICD-10-CM

## 2019-04-19 DIAGNOSIS — O10913 Unspecified pre-existing hypertension complicating pregnancy, third trimester: Secondary | ICD-10-CM

## 2019-04-19 DIAGNOSIS — O0990 Supervision of high risk pregnancy, unspecified, unspecified trimester: Secondary | ICD-10-CM

## 2019-04-19 DIAGNOSIS — Z3A35 35 weeks gestation of pregnancy: Secondary | ICD-10-CM

## 2019-04-19 NOTE — Progress Notes (Signed)
Routine Prenatal Care Visit  Subjective  Tiffany Chapman is a 38 y.o. W7P7106 at [redacted]w[redacted]d being seen today for ongoing prenatal care.  She is currently monitored for the following issues for this low-risk pregnancy and has Supervision of high risk pregnancy, antepartum; Maternal obesity, antepartum; BMI 40.0-44.9, adult ([place]); Chronic hypertension affecting pregnancy; Anxiety and depression; History of polyhydramnios; Insulin controlled gestational diabetes mellitus (GDM) in third trimester; and Indication for care in labor and delivery, antepartum on their problem list.  ----------------------------------------------------------------------------------- Patient reports no complaints.   Contractions: Not present. Vag. Bleeding: None.  Movement: Present. Denies leaking of fluid.  ----------------------------------------------------------------------------------- The following portions of the patient's history were reviewed and updated as appropriate: allergies, current medications, past family history, past medical history, past social history, past surgical history and problem list. Problem list updated.   Objective  Blood pressure (!) 148/96, weight 276 lb (125.2 kg), last menstrual period 08/12/2018, not currently breastfeeding. Pregravid weight 256 lb (116.1 kg) Total Weight Gain 20 lb (9.072 kg) Urinalysis:      Fetal Status: Fetal Heart Rate (bpm): 140   Movement: Present     General:  Alert, oriented and cooperative. Patient is in no acute distress.  Skin: Skin is warm and dry. No rash noted.   Cardiovascular: Normal heart rate noted  Respiratory: Normal respiratory effort, no problems with respiration noted  Abdomen: Soft, gravid, appropriate for gestational age. Pain/Pressure: Absent     Pelvic:  Cervical exam deferred        Extremities: Normal range of motion.     ental Status: Normal mood and affect. Normal behavior. Normal judgment and thought content.   Date Fasting  Breakfast Lunch Dinner  8/7 92 104 115 123  8/8 105 102 130 118  8/9 102 95 100 146  8/10 102 111 132 120  8/11 110      Baseline: 140 Variability: moderate Accelerations: present Decelerations: absent The patient was monitored for 30 minutes, fetal heart rate tracing was deemed reactive, category I tracing,  Immunization History  Administered Date(s) Administered   Influenza,inj,Quad PF,6+ Mos 06/18/2017   Tdap 07/02/2017     Assessment   38 y.o. [id number] at [redacted]w[redacted]d by  05/19/2019, by Last Menstrual Period presenting for routine prenatal visit  Plan   Pregnancy#5 Problems (from 08/12/18 to present)    Problem Noted Resolved   Supervision of high risk pregnancy, antepartum 01/14/2017 by Doristine Counter, CMA No   Priority:  High     Overview Addendum 02/28/2019  8:06 AM by Malachy Mood, MD     Clinic [facility] Prenatal Labs  Dating LMP = 9 week [place] Blood type: O negative  Genetic Screen NIPS: Normal XX, Inheritest negative Antibody: Negative  Anatomic [place] Normal Rubella: Immune Varicella: Immune  GTT 116-early; 28 wk 180 3-hr 106 / 204 / 151 / 113 RPR: Immune  Rhogam [X]  28 weeks HBsAg: negative  TDaP vaccine [ ]  30 weeks   HIV: negative  Baby Food Bottle                 GBS:   Contraception POPs Pap: 01/14/17 NIL HPV negative  Influenza Declined 3/9       Support Person Husband: [name] History of polyhydramnios last preganacy    HIgh risk for obesity with BMI=40, CHTN, habitual abortions, AMA, and anxiety/depression        Insulin controlled gestational diabetes mellitus (GDM) in third trimester 02/28/2019 by Malachy Mood, MD No   Overview Addendum  04/13/2019  1:03 PM by Vena AustriaStaebler, Haneefah Venturini, MD    Insulin start at 33 weeks 28 units lantus at bedtime, 4 units short acting with meals - increased to 34 units lantus 4 units aspart with meals (04/12/2019)      History of polyhydramnios 10/07/2018 by Vena AustriaStaebler, Darris Staiger, MD No   Maternal obesity, antepartum 01/18/2017 by Farrel ConnersGutierrez,  Colleen, CNM No   Overview Addendum 04/12/2019  6:09 PM by Vena AustriaStaebler, Ryon Layton, MD    BMI >=40 [X]  bASA (>12 weeks)       Chronic hypertension affecting pregnancy 01/18/2017 by Farrel ConnersGutierrez, Colleen, CNM No   Overview Addendum 03/22/2019  7:55 AM by Vena AustriaStaebler, Serafin Decatur, MD    Monitor blood pressures. CMP and TSH WNL. Baseline PC ratio 98 mgm  [X]  Aspirin 81 mg daily after 12 weeks; discontinue after 36 weeks [X]  baseline labs with CBC, CMP, urine protein/creatinine ratio [ ]  no BP meds unless BPs become elevated [ ]  ultrasound for growth at [X]  28 week 2lbs 9oz or 1154g c/w 55.2%ile, [x]  32 week 5lbs 1oz or 2283g c/w 78.9%ile AFI 18.3cm, 36 [ ]  weeks   Current antihypertensives:  None   Baseline and surveillance labs [facility] refresh links as needed)  Lab Results  Component Value Date   PLT 283 01/23/2017   CREATININE 0.65 01/23/2017   AST 11 01/23/2017   ALT 7 01/23/2017    Antenatal Testing CHTN - O10.919  Group I  BP < 140/90, no preeclampsia, AGA,  nml AFV, +/- meds    Group II BP > 140/90, on meds, no preeclampsia, AGA, nml AFV  20-28-34-38  20-24-28-32-35-38  32//2 x wk  28//BPP wkly then 32//2 x wk  40 no meds; 39 meds  PRN or [id number]             Gestational age appropriate obstetric precautions including but not limited to vaginal bleeding, contractions, leaking of fluid and fetal movement were reviewed in detail with the patient.    1) CHTN - BP remains mild range, currently still of labetalol.   Asymptomatic with normal labs 8/7 - discussed symptoms of preeclampsia, may trend labs weekly - growth scan moved to Friday as no ultrasound today  2) GDM - Insulin adjustment long acting up to 38 units (36U), pre-meal 4-6-6 (4-4-4)  Return in about 3 days (around 04/22/2019) for ROB, NST, growth scan (also move AFI next week to Friday).  Vena AustriaAndreas Evonna Stoltz, MD, Evern CoreFACOG [facility], [facility] 04/19/2019, 10:02 AM

## 2019-04-19 NOTE — Progress Notes (Signed)
ROB NST 

## 2019-04-22 ENCOUNTER — Ambulatory Visit (INDEPENDENT_AMBULATORY_CARE_PROVIDER_SITE_OTHER): Payer: BLUE CROSS/BLUE SHIELD | Admitting: Obstetrics and Gynecology

## 2019-04-22 ENCOUNTER — Other Ambulatory Visit: Payer: Self-pay

## 2019-04-22 ENCOUNTER — Other Ambulatory Visit (HOSPITAL_COMMUNITY)
Admission: RE | Admit: 2019-04-22 | Discharge: 2019-04-22 | Disposition: A | Discharge status: Home / Self Care | Payer: BLUE CROSS/BLUE SHIELD | Source: Ambulatory Visit | Attending: Obstetrics and Gynecology | Admitting: Obstetrics and Gynecology

## 2019-04-22 ENCOUNTER — Ambulatory Visit (INDEPENDENT_AMBULATORY_CARE_PROVIDER_SITE_OTHER): Payer: BLUE CROSS/BLUE SHIELD

## 2019-04-22 ENCOUNTER — Encounter: Payer: Self-pay | Admitting: Obstetrics and Gynecology

## 2019-04-22 VITALS — BP 148/98 | Wt 280.0 lb

## 2019-04-22 DIAGNOSIS — O10913 Unspecified pre-existing hypertension complicating pregnancy, third trimester: Secondary | ICD-10-CM

## 2019-04-22 DIAGNOSIS — O0990 Supervision of high risk pregnancy, unspecified, unspecified trimester: Secondary | ICD-10-CM | POA: Diagnosis not present

## 2019-04-22 DIAGNOSIS — O99210 Obesity complicating pregnancy, unspecified trimester: Secondary | ICD-10-CM

## 2019-04-22 DIAGNOSIS — O10919 Unspecified pre-existing hypertension complicating pregnancy, unspecified trimester: Secondary | ICD-10-CM

## 2019-04-22 DIAGNOSIS — Z6841 Body Mass Index (BMI) 40.0 and over, adult: Secondary | ICD-10-CM

## 2019-04-22 DIAGNOSIS — Z3A36 36 weeks gestation of pregnancy: Secondary | ICD-10-CM | POA: Diagnosis not present

## 2019-04-22 DIAGNOSIS — Z362 Encounter for other antenatal screening follow-up: Secondary | ICD-10-CM | POA: Diagnosis not present

## 2019-04-22 DIAGNOSIS — Z8759 Personal history of other complications of pregnancy, childbirth and the puerperium: Secondary | ICD-10-CM

## 2019-04-22 DIAGNOSIS — O24414 Gestational diabetes mellitus in pregnancy, insulin controlled: Secondary | ICD-10-CM

## 2019-04-22 DIAGNOSIS — O0993 Supervision of high risk pregnancy, unspecified, third trimester: Secondary | ICD-10-CM

## 2019-04-22 DIAGNOSIS — O99213 Obesity complicating pregnancy, third trimester: Secondary | ICD-10-CM

## 2019-04-22 NOTE — Progress Notes (Signed)
Routine Prenatal Care Visit  Subjective  Tiffany Chapman is a 38 y.o. [id number] at [redacted]w[redacted]d being seen today for ongoing prenatal care.  She is currently monitored for the following issues for this high-risk pregnancy and has Supervision of high risk pregnancy, antepartum; Maternal obesity, antepartum; BMI 40.0-44.9, adult ([place]); Chronic hypertension affecting pregnancy; Anxiety and depression; History of polyhydramnios; Insulin controlled gestational diabetes mellitus (GDM) in third trimester; and Indication for care in labor and delivery, antepartum on their problem list.  ----------------------------------------------------------------------------------- Patient reports no complaints.  Denies HA, visual changes, and RUQ pain. Contractions: Not present. Vag. Bleeding: None.  Movement: Present. Denies leaking of fluid.  GDM: glucose log.  Shows improvement since her last visit on 8/11.  See image below.  She had a normal fasting BG this morning, but abnormal Wednesday and yesterday.  Instructed that if she did not have <95 for fasting over the next couple of days to increase her long-acting to 40.    BG log image:    ----------------------------------------------------------------------------------- The following portions of the patient's history were reviewed and updated as appropriate: allergies, current medications, past family history, past medical history, past social history, past surgical history and problem list. Problem list updated.   Objective  Blood pressure (!) 148/98, weight 280 lb (127 kg), last menstrual period 08/12/2018, not currently breastfeeding. Pregravid weight 256 lb (116.1 kg) Total Weight Gain 24 lb (10.9 kg) Urinalysis: Urine Protein    Urine Glucose    Fetal Status: Fetal Heart Rate (bpm): 140   Movement: Present  Presentation: Transverse  General:  Alert, oriented and cooperative. Patient is in no acute distress.  Skin: Skin is warm and dry. No rash noted.    Cardiovascular: Normal heart rate noted  Respiratory: Normal respiratory effort, no problems with respiration noted  Abdomen: Soft, gravid, appropriate for gestational age. Pain/Pressure: Absent     Pelvic:  Cervical exam deferred        Extremities: Normal range of motion.     Mental Status: Normal mood and affect. Normal behavior. Normal judgment and thought content.   NST: Baseline FHR: 140 beats/min Variability: moderate Accelerations: present Decelerations: absent Tocometry: not done  Interpretation:  INDICATIONS: gestational diabetes mellitus and chronic hypertension RESULTS:  A NST procedure was performed with FHR monitoring and a normal baseline established, appropriate time of 20-40 minutes of evaluation, and accels >2 seen w 15x15 characteristics.  Results show a REACTIVE NST.    Imaging Results US Ob Follow Up  Result Date: 04/22/2019 Patient Name: Tiffany Chapman DOB: 02-04-1981 MRN: [id number] ULTRASOUND REPORT Location: [facility] Date of Service: 04/22/2019 Indications:growth/afi Findings: Tiffany Chapman intrauterine pregnancy is visualized with FHR at 151 BPM. Biometrics give an (U/S) Gestational age of [redacted]w[redacted]d and an (U/S) EDD of 05/12/2019; this correlates with the clinically established Estimated Date of Delivery: 05/19/19.  Fetal presentation is transverse. Placenta: anterior. Grade: 1 AFI: 24.5 cm Growth percentile is 78.6. EFW: 3,293 g (7 lb 4 oz)  Impression: 1. [redacted]w[redacted]d Viable [place]leton Intrauterine pregnancy previously established criteria. 2. Growth is 78.6 %ile.  AFI is 24.5 cm. 3. Transverse presentation 4. Polyhydramnios, mild Gweneth Dimitri, RT The ultrasound images and findings were reviewed by me and I agree with the above report. Prentice Docker, MD, [facility], [facility] 04/22/2019 1:38 PM       Assessment   38 y.o. [id number] at [redacted]w[redacted]d by  05/19/2019, by Last Menstrual Period presenting for routine prenatal visit  Plan   Pregnancy#5  Problems (from 08/12/18 to present)    Problem Noted Resolved   Insulin controlled gestational diabetes mellitus (GDM) in third trimester 02/28/2019 by Vena AustriaStaebler, Andreas, MD No   Overview Addendum 04/19/2019 10:01 AM by Vena AustriaStaebler, Andreas, MD    Insulin start at 33 weeks 28 units lantus at bedtime, 4 units short acting with meals - increased to 34 units lantus 4 units aspart with meals (04/12/2019) - increased to 38 units lantus 4 - 6 - 6 units aspart with meals (04/19/2019)      History of polyhydramnios 10/07/2018 by Vena AustriaStaebler, Andreas, MD No   Maternal obesity, antepartum 01/18/2017 by Farrel ConnersGutierrez, Colleen, CNM No   Overview Addendum 04/12/2019  6:09 PM by Vena AustriaStaebler, Andreas, MD    BMI >=40 [X]  bASA (>12 weeks)       Chronic hypertension affecting pregnancy 01/18/2017 by Farrel ConnersGutierrez, Colleen, CNM No   Overview Addendum 03/22/2019  7:55 AM by Vena AustriaStaebler, Andreas, MD    Monitor blood pressures. CMP and TSH WNL. Baseline PC ratio 98 mgm  [X]  Aspirin 81 mg daily after 12 weeks; discontinue after 36 weeks [X]  baseline labs with CBC, CMP, urine protein/creatinine ratio [ ]  no BP meds unless BPs become elevated [ ]  ultrasound for growth at [X]  28 week 2lbs 9oz or 1154g c/w 55.2%ile, [x]  32 week 5lbs 1oz or 2283g c/w 78.9%ile AFI 18.3cm, 36 [ ]  weeks   Current antihypertensives:  None   Baseline and surveillance labs [facility], refresh links as needed)  Lab Results  Component Value Date   PLT 283 01/23/2017   CREATININE 0.65 01/23/2017   AST 11 01/23/2017   ALT 7 01/23/2017    Antenatal Testing CHTN - O10.919  Group I  BP < 140/90, no preeclampsia, AGA,  nml AFV, +/- meds    Group II BP > 140/90, on meds, no preeclampsia, AGA, nml AFV  20-28-34-38  20-24-28-32-35-38  32//2 x wk  28//BPP wkly then 32//2 x wk  40 no meds; 39 meds  PRN or 37         Supervision of high risk pregnancy, antepartum 01/14/2017 by Trellis Momentroy, Hollie, CMA No   Overview Addendum 04/19/2019  9:57 AM by  Vena AustriaStaebler, Andreas, MD     [facility]  Dating LMP = 9 week US Blood type: O negative  Genetic Screen NIPS: Normal XX, Inheritest negative Antibody: Negative  Anatomic US Normal Rubella: Immune Varicella: Immune  GTT 116-early; 28 wk 180 3-hr 106 / 204 / 151 / 113 RPR: Immune  Rhogam [X]  28 weeks HBsAg: negative  TDaP vaccine [ ]  30 weeks   HIV: negative  Baby Food Bottle                 GBS:   Contraception POPs Pap: 01/14/17 NIL HPV negative  Influenza Declined 3/9       Support Person Husband: Greig CastillaAndrew History of polyhydramnios last preganacy    HIgh risk for obesity with BMI=40, CHTN, habitual abortions, AMA, and anxiety/depression            Preterm labor symptoms and general obstetric precautions including but not limited to vaginal bleeding, contractions, leaking of fluid and fetal movement were reviewed in detail with the patient. Please refer to After Visit Summary for other counseling recommendations.   - mild hydramnios (24.5). continue to follow - continue current regimen of insulin. May consider increasing long-acting, if fasting BG values do not remain normal. - GBS/aptima today  Return for Keep previously scheduled appts.  Thomasene MohairStephen Tymothy Cass, MD, Merlinda FrederickFACOG [facility], [facility] 04/22/2019 4:38 PM

## 2019-04-24 LAB — STREP GP B NAA: Strep Gp B NAA: NEGATIVE

## 2019-04-26 ENCOUNTER — Ambulatory Visit (INDEPENDENT_AMBULATORY_CARE_PROVIDER_SITE_OTHER): Payer: BLUE CROSS/BLUE SHIELD | Admitting: Maternal Newborn

## 2019-04-26 ENCOUNTER — Encounter: Payer: Self-pay | Admitting: Maternal Newborn

## 2019-04-26 ENCOUNTER — Other Ambulatory Visit: Payer: Self-pay

## 2019-04-26 VITALS — BP 140/98 | Wt 277.0 lb

## 2019-04-26 DIAGNOSIS — Z3A36 36 weeks gestation of pregnancy: Secondary | ICD-10-CM

## 2019-04-26 DIAGNOSIS — O24414 Gestational diabetes mellitus in pregnancy, insulin controlled: Secondary | ICD-10-CM

## 2019-04-26 DIAGNOSIS — O0990 Supervision of high risk pregnancy, unspecified, unspecified trimester: Secondary | ICD-10-CM

## 2019-04-26 DIAGNOSIS — O09293 Supervision of pregnancy with other poor reproductive or obstetric history, third trimester: Secondary | ICD-10-CM

## 2019-04-26 DIAGNOSIS — Z369 Encounter for antenatal screening, unspecified: Secondary | ICD-10-CM

## 2019-04-26 LAB — CERVICOVAGINAL ANCILLARY ONLY
Chlamydia: NEGATIVE
Neisseria Gonorrhea: NEGATIVE

## 2019-04-26 LAB — POCT URINALYSIS DIPSTICK OB
Glucose, UA: NEGATIVE
POC,PROTEIN,UA: NEGATIVE

## 2019-04-26 NOTE — Progress Notes (Signed)
Routine Prenatal Care Visit  Subjective  Tiffany Chapman is a 38 y.o. G5P1031 at 3534w5d being seen today for ongoing prenatal care.  She is currently monitored for the following issues for this high-risk pregnancy and has Supervision of high risk pregnancy, antepartum; Maternal obesity, antepartum; BMI 40.0-44.9, adult ([facility]); Chronic hypertension affecting pregnancy; Anxiety and depression; History of polyhydramnios; Insulin controlled gestational diabetes mellitus (GDM) in third trimester; and Indication for care in labor and delivery, antepartum on their problem list.  ----------------------------------------------------------------------------------- Patient reports no complaints.  She is not having headaches, visual changes, or pain. Contractions: Not present. Vag. Bleeding: None.  Movement: Present. No leaking of fluid.  ----------------------------------------------------------------------------------- The following portions of the patient's history were reviewed and updated as appropriate: allergies, current medications, past family history, past medical history, past social history, past surgical history and problem list. Problem list updated.   Objective  Blood pressure (!) 140/98, weight 277 lb (125.6 kg), last menstrual period 08/12/2018, not currently breastfeeding. Pregravid weight 256 lb (116.1 kg) Total Weight Gain 21 lb (9.526 kg) Urinalysis: Urine dipstick shows negative for glucose, protein.  Fetal Status: Fetal Heart Rate (bpm): 137   Movement: Present     General:  Alert, oriented and cooperative. Patient is in no acute distress.  Skin: Skin is warm and dry. No rash noted.   Cardiovascular: Normal heart rate noted  Respiratory: Normal respiratory effort, no problems with respiration noted  Abdomen: Soft, gravid, appropriate for gestational age. Pain/Pressure: Absent     Pelvic:  Cervical exam deferred        Extremities: Normal range of motion.     Mental Status:  Normal mood and affect. Normal behavior. Normal judgment and thought content.   NST Baseline: 140 bpm Variability: moderate Accelerations: present Decelerations: absent Tocometry: not done The patient was monitored for 20 minutes, fetal heart rate tracing was deemed reactive  Assessment   38 y.o. [id number] at 7034w5d, EDD 05/19/2019 by Last Menstrual Period presenting for a routine prenatal visit.  Plan   Pregnancy#5 Problems (from 08/12/18 to present)    Problem Noted Resolved   Insulin controlled gestational diabetes mellitus (GDM) in third trimester 02/28/2019 by Vena AustriaStaebler, Andreas, MD No   Overview Addendum 04/19/2019 10:01 AM by Vena AustriaStaebler, Andreas, MD    Insulin start at 33 weeks 28 units lantus at bedtime, 4 units short acting with meals - increased to 34 units lantus 4 units aspart with meals (04/12/2019) - increased to 38 units lantus 4 - 6 - 6 units aspart with meals (04/19/2019)      History of polyhydramnios 10/07/2018 by Vena AustriaStaebler, Andreas, MD No   Maternal obesity, antepartum 01/18/2017 by Farrel ConnersGutierrez, Colleen, CNM No   Overview Addendum 04/12/2019  6:09 PM by Vena AustriaStaebler, Andreas, MD    BMI >=40 [X]  bASA (>12 weeks)       Chronic hypertension affecting pregnancy 01/18/2017 by Farrel ConnersGutierrez, Colleen, CNM No   Overview Addendum 03/22/2019  7:55 AM by Vena AustriaStaebler, Andreas, MD    Monitor blood pressures. CMP and TSH WNL. Baseline PC ratio 98 mgm  [X]  Aspirin 81 mg daily after 12 weeks; discontinue after 36 weeks [X]  baseline labs with CBC, CMP, urine protein/creatinine ratio [ ]  no BP meds unless BPs become elevated [ ]  ultrasound for growth at [X]  28 week 2lbs 9oz or 1154g c/w 55.2%ile, [x]  32 week 5lbs 1oz or 2283g c/w 78.9%ile AFI 18.3cm, 36 [ ]  weeks   Current antihypertensives:  None   Baseline and surveillance labs (pulled  in from [facility], refresh links as needed)  Lab Results  Component Value Date   PLT 283 01/23/2017   CREATININE 0.65 01/23/2017   AST 11 01/23/2017   ALT 7  01/23/2017    Antenatal Testing CHTN - O10.919  Group I  BP < 140/90, no preeclampsia, AGA,  nml AFV, +/- meds    Group II BP > 140/90, on meds, no preeclampsia, AGA, nml AFV  20-28-34-38  20-24-28-32-35-38  32//2 x wk  28//BPP wkly then 32//2 x wk  40 no meds; 39 meds  PRN or 37         Supervision of high risk pregnancy, antepartum 01/14/2017 by Doristine Counter, CMA No   Overview Addendum 04/19/2019  9:57 AM by Malachy Mood, MD     Clinic [facility] Prenatal Labs  Dating LMP = 9 week [place] Blood type: O negative  Genetic Screen NIPS: Normal XX, Inheritest negative Antibody: Negative  Anatomic [place] Normal Rubella: Immune Varicella: Immune  GTT 116-early; 28 wk 180 3-hr 106 / 204 / 151 / 113 RPR: Immune  Rhogam [X]  28 weeks HBsAg: negative  TDaP vaccine [ ]  30 weeks   HIV: negative  Baby Food Bottle                 GBS:   Contraception POPs Pap: 01/14/17 NIL HPV negative  Influenza Declined 3/9       Support Person Husband: [name] History of polyhydramnios last preganacy    HIgh risk for obesity with BMI=40, CHTN, habitual abortions, AMA, and anxiety/depression           GDM: Glucose control improved some since last time, two fasting values and one postprandial value were out of range. Currently taking Lantus 38 units, insulin aspart 4/6/6 units.  Fasting 95, 94, 94, 101 Postprandial: 96, 101, 91, 87, 116, 113, 82, 131, 104, 111, 119  Please refer to After Visit Summary for other counseling recommendations.   Keep scheduled ROB 04/29/2019.  Avel Sensor, CNM 04/26/2019  9:51 AM

## 2019-04-26 NOTE — Patient Instructions (Signed)
Third Trimester of Pregnancy The third trimester is from week 28 through week 40 (months 7 through 9). The third trimester is a time when the unborn baby (fetus) is growing rapidly. At the end of the ninth month, the fetus is about 20 inches in length and weighs 6-10 pounds. Body changes during your third trimester Your body will continue to go through many changes during pregnancy. The changes vary from woman to woman. During the third trimester:  Your weight will continue to increase. You can expect to gain 25-35 pounds (11-16 kg) by the end of the pregnancy.  You may begin to get stretch marks on your hips, abdomen, and breasts.  You may urinate more often because the fetus is moving lower into your pelvis and pressing on your bladder.  You may develop or continue to have heartburn. This is caused by increased hormones that slow down muscles in the digestive tract.  You may develop or continue to have constipation because increased hormones slow digestion and cause the muscles that push waste through your intestines to relax.  You may develop hemorrhoids. These are swollen veins (varicose veins) in the rectum that can itch or be painful.  You may develop swollen, bulging veins (varicose veins) in your legs.  You may have increased body aches in the pelvis, back, or thighs. This is due to weight gain and increased hormones that are relaxing your joints.  You may have changes in your hair. These can include thickening of your hair, rapid growth, and changes in texture. Some women also have hair loss during or after pregnancy, or hair that feels dry or thin. Your hair will most likely return to normal after your baby is born.  Your breasts will continue to grow and they will continue to become tender. A yellow fluid (colostrum) may leak from your breasts. This is the first milk you are producing for your baby.  Your belly button may stick out.  You may notice more swelling in your hands,  face, or ankles.  You may have increased tingling or numbness in your hands, arms, and legs. The skin on your belly may also feel numb.  You may feel short of breath because of your expanding uterus.  You may have more problems sleeping. This can be caused by the size of your belly, increased need to urinate, and an increase in your body's metabolism.  You may notice the fetus "dropping," or moving lower in your abdomen (lightening).  You may have increased vaginal discharge.  You may notice your joints feel loose and you may have pain around your pelvic bone. What to expect at prenatal visits You will have prenatal exams every 2 weeks until week 36. Then you will have weekly prenatal exams. During a routine prenatal visit:  You will be weighed to make sure you and the baby are growing normally.  Your blood pressure will be taken.  Your abdomen will be measured to track your baby's growth.  The fetal heartbeat will be listened to.  Any test results from the previous visit will be discussed.  You may have a cervical check near your due date to see if your cervix has softened or thinned (effaced).  You will be tested for Group B streptococcus. This happens between 35 and 37 weeks. Your health care provider may ask you:  What your birth plan is.  How you are feeling.  If you are feeling the baby move.  If you have had any abnormal   symptoms, such as leaking fluid, bleeding, severe headaches, or abdominal cramping.  If you are using any tobacco products, including cigarettes, chewing tobacco, and electronic cigarettes.  If you have any questions. Other tests or screenings that may be performed during your third trimester include:  Blood tests that check for low iron levels (anemia).  Fetal testing to check the health, activity level, and growth of the fetus. Testing is done if you have certain medical conditions or if there are problems during the pregnancy.  Nonstress test  (NST). This test checks the health of your baby to make sure there are no signs of problems, such as the baby not getting enough oxygen. During this test, a belt is placed around your belly. The baby is made to move, and its heart rate is monitored during movement. What is false labor? False labor is a condition in which you feel small, irregular tightenings of the muscles in the womb (contractions) that usually go away with rest, changing position, or drinking water. These are called Braxton Hicks contractions. Contractions may last for hours, days, or even weeks before true labor sets in. If contractions come at regular intervals, become more frequent, increase in intensity, or become painful, you should see your health care provider. What are the signs of labor?  Abdominal cramps.  Regular contractions that start at 10 minutes apart and become stronger and more frequent with time.  Contractions that start on the top of the uterus and spread down to the lower abdomen and back.  Increased pelvic pressure and dull back pain.  A watery or bloody mucus discharge that comes from the vagina.  Leaking of amniotic fluid. This is also known as your "water breaking." It could be a slow trickle or a gush. Let your health care provider know if it has a color or strange odor. If you have any of these signs, call your health care provider right away, even if it is before your due date. Follow these instructions at home: Medicines  Follow your health care provider's instructions regarding medicine use. Specific medicines may be either safe or unsafe to take during pregnancy.  Take a prenatal vitamin that contains at least 600 micrograms (mcg) of folic acid.  If you develop constipation, try taking a stool softener if your health care provider approves. Eating and drinking   Eat a balanced diet that includes fresh fruits and vegetables, whole grains, good sources of protein such as meat, eggs, or tofu,  and low-fat dairy. Your health care provider will help you determine the amount of weight gain that is right for you.  Avoid raw meat and uncooked cheese. These carry germs that can cause birth defects in the baby.  If you have low calcium intake from food, talk to your health care provider about whether you should take a daily calcium supplement.  Eat four or five small meals rather than three large meals a day.  Limit foods that are high in fat and processed sugars, such as fried and sweet foods.  To prevent constipation: ? Drink enough fluid to keep your urine clear or pale yellow. ? Eat foods that are high in fiber, such as fresh fruits and vegetables, whole grains, and beans. Activity  Exercise only as directed by your health care provider. Most women can continue their usual exercise routine during pregnancy. Try to exercise for 30 minutes at least 5 days a week. Stop exercising if you experience uterine contractions.  Avoid heavy lifting.  Do   not exercise in extreme heat or humidity, or at high altitudes.  Wear low-heel, comfortable shoes.  Practice good posture.  You may continue to have sex unless your health care provider tells you otherwise. Relieving pain and discomfort  Take frequent breaks and rest with your legs elevated if you have leg cramps or low back pain.  Take warm sitz baths to soothe any pain or discomfort caused by hemorrhoids. Use hemorrhoid cream if your health care provider approves.  Wear a good support bra to prevent discomfort from breast tenderness.  If you develop varicose veins: ? Wear support pantyhose or compression stockings as told by your healthcare provider. ? Elevate your feet for 15 minutes, 3-4 times a day. Prenatal care  Write down your questions. Take them to your prenatal visits.  Keep all your prenatal visits as told by your health care provider. This is important. Safety  Wear your seat belt at all times when driving.  Make  a list of emergency phone numbers, including numbers for family, friends, the hospital, and police and fire departments. General instructions  Avoid cat litter boxes and soil used by cats. These carry germs that can cause birth defects in the baby. If you have a cat, ask someone to clean the litter box for you.  Do not travel far distances unless it is absolutely necessary and only with the approval of your health care provider.  Do not use hot tubs, steam rooms, or saunas.  Do not drink alcohol.  Do not use any products that contain nicotine or tobacco, such as cigarettes and e-cigarettes. If you need help quitting, ask your health care provider.  Do not use any medicinal herbs or unprescribed drugs. These chemicals affect the formation and growth of the baby.  Do not douche or use tampons or scented sanitary pads.  Do not cross your legs for long periods of time.  To prepare for the arrival of your baby: ? Take prenatal classes to understand, practice, and ask questions about labor and delivery. ? Make a trial run to the hospital. ? Visit the hospital and tour the maternity area. ? Arrange for maternity or paternity leave through employers. ? Arrange for family and friends to take care of pets while you are in the hospital. ? Purchase a rear-facing car seat and make sure you know how to install it in your car. ? Pack your hospital bag. ? Prepare the baby's nursery. Make sure to remove all pillows and stuffed animals from the baby's crib to prevent suffocation.  Visit your dentist if you have not gone during your pregnancy. Use a soft toothbrush to brush your teeth and be gentle when you floss. Contact a health care provider if:  You are unsure if you are in labor or if your water has broken.  You become dizzy.  You have mild pelvic cramps, pelvic pressure, or nagging pain in your abdominal area.  You have lower back pain.  You have persistent nausea, vomiting, or diarrhea.   You have an unusual or bad smelling vaginal discharge.  You have pain when you urinate. Get help right away if:  Your water breaks before 37 weeks.  You have regular contractions less than 5 minutes apart before 37 weeks.  You have a fever.  You are leaking fluid from your vagina.  You have spotting or bleeding from your vagina.  You have severe abdominal pain or cramping.  You have rapid weight loss or weight gain.  You have   shortness of breath with chest pain.  You notice sudden or extreme swelling of your face, hands, ankles, feet, or legs.  Your baby makes fewer than 10 movements in 2 hours.  You have severe headaches that do not go away when you take medicine.  You have vision changes. Summary  The third trimester is from week 28 through week 40, months 7 through 9. The third trimester is a time when the unborn baby (fetus) is growing rapidly.  During the third trimester, your discomfort may increase as you and your baby continue to gain weight. You may have abdominal, leg, and back pain, sleeping problems, and an increased need to urinate.  During the third trimester your breasts will keep growing and they will continue to become tender. A yellow fluid (colostrum) may leak from your breasts. This is the first milk you are producing for your baby.  False labor is a condition in which you feel small, irregular tightenings of the muscles in the womb (contractions) that eventually go away. These are called Braxton Hicks contractions. Contractions may last for hours, days, or even weeks before true labor sets in.  Signs of labor can include: abdominal cramps; regular contractions that start at 10 minutes apart and become stronger and more frequent with time; watery or bloody mucus discharge that comes from the vagina; increased pelvic pressure and dull back pain; and leaking of amniotic fluid. This information is not intended to replace advice given to you by your health  care provider. Make sure you discuss any questions you have with your health care provider. Document Released: 08/19/2001 Document Revised: 12/16/2018 Document Reviewed: 09/30/2016 Elsevier Patient Education  2020 [employer]  

## 2019-04-29 ENCOUNTER — Other Ambulatory Visit: Payer: Self-pay

## 2019-04-29 ENCOUNTER — Ambulatory Visit (INDEPENDENT_AMBULATORY_CARE_PROVIDER_SITE_OTHER): Payer: BLUE CROSS/BLUE SHIELD | Admitting: Obstetrics and Gynecology

## 2019-04-29 ENCOUNTER — Ambulatory Visit (INDEPENDENT_AMBULATORY_CARE_PROVIDER_SITE_OTHER): Payer: BLUE CROSS/BLUE SHIELD

## 2019-04-29 VITALS — BP 152/90 | Wt 276.0 lb

## 2019-04-29 DIAGNOSIS — Z362 Encounter for other antenatal screening follow-up: Secondary | ICD-10-CM

## 2019-04-29 DIAGNOSIS — O24414 Gestational diabetes mellitus in pregnancy, insulin controlled: Secondary | ICD-10-CM | POA: Diagnosis not present

## 2019-04-29 DIAGNOSIS — O99210 Obesity complicating pregnancy, unspecified trimester: Secondary | ICD-10-CM

## 2019-04-29 DIAGNOSIS — O99213 Obesity complicating pregnancy, third trimester: Secondary | ICD-10-CM

## 2019-04-29 DIAGNOSIS — O10919 Unspecified pre-existing hypertension complicating pregnancy, unspecified trimester: Secondary | ICD-10-CM

## 2019-04-29 DIAGNOSIS — O10913 Unspecified pre-existing hypertension complicating pregnancy, third trimester: Secondary | ICD-10-CM | POA: Diagnosis not present

## 2019-04-29 DIAGNOSIS — Z3A37 37 weeks gestation of pregnancy: Secondary | ICD-10-CM

## 2019-04-29 DIAGNOSIS — O0990 Supervision of high risk pregnancy, unspecified, unspecified trimester: Secondary | ICD-10-CM

## 2019-04-29 DIAGNOSIS — Z8759 Personal history of other complications of pregnancy, childbirth and the puerperium: Secondary | ICD-10-CM

## 2019-04-29 DIAGNOSIS — O09293 Supervision of pregnancy with other poor reproductive or obstetric history, third trimester: Secondary | ICD-10-CM | POA: Diagnosis not present

## 2019-04-29 DIAGNOSIS — O0993 Supervision of high risk pregnancy, unspecified, third trimester: Secondary | ICD-10-CM

## 2019-04-29 NOTE — Progress Notes (Signed)
ROB AFI/NST 

## 2019-04-29 NOTE — Progress Notes (Signed)
Routine Prenatal Care Visit  Subjective  Tiffany Chapman is a 38 y.o. G5P1031 at 4648w1d being seen today for ongoing prenatal care.  She is currently monitored for the following issues for this high-risk pregnancy and has Supervision of high risk pregnancy, antepartum; Maternal obesity, antepartum; BMI 40.0-44.9, adult (HCC); Chronic hypertension affecting pregnancy; Anxiety and depression; History of polyhydramnios; and Insulin controlled gestational diabetes mellitus (GDM) in third trimester on their problem list.  ----------------------------------------------------------------------------------- Patient reports no complaints.  Denies HA, vision changes, RUQ/epigastric pain. Contractions: Not present. Vag. Bleeding: None.  Movement: Present. Denies leaking of fluid.  ----------------------------------------------------------------------------------- The following portions of the patient's history were reviewed and updated as appropriate: allergies, current medications, past family history, past medical history, past social history, past surgical history and problem list. Problem list updated.   Objective  Blood pressure (!) 152/90, weight 276 lb (125.2 kg), last menstrual period 08/12/2018, not currently breastfeeding. Pregravid weight 256 lb (116.1 kg) Total Weight Gain 20 lb (9.072 kg) Urinalysis:      Fetal Status: Fetal Heart Rate (bpm): 130   Movement: Present  Presentation: Transverse  General:  Alert, oriented and cooperative. Patient is in no acute distress.  Skin: Skin is warm and dry. No rash noted.   Cardiovascular: Normal heart rate noted  Respiratory: Normal respiratory effort, no problems with respiration noted  Abdomen: Soft, gravid, appropriate for gestational age. Pain/Pressure: Present     Pelvic:  Cervical exam deferred        Extremities: Normal range of motion.     ental Status: Normal mood and affect. Normal behavior. Normal judgment and thought content.    Immunization History  Administered Date(s) Administered   Influenza,inj,Quad PF,6+ Mos 06/18/2017   Tdap 07/02/2017   Baseline: 130 Variability: moderate Accelerations: present Decelerations: absent Tocometry:  The patient was monitored for 30 minutes, fetal heart rate tracing was deemed reactive, category I tracing,  CPT 59025 [place] Ob Limited  Result Date: 04/12/2019 Patient Name: Tiffany Chapman DOB: 07/08/1981 MRN: [id number] ULTRASOUND REPORT Location: [facility] Date of Service: 04/12/2019 Indications:AFI Findings: Mason JimSingleton intrauterine pregnancy is visualized with FHR at 154 BPM. Fetal presentation is Transverse. Placenta: anterior. Grade: 1 AFI: 21.5 cm Impression: 1. 2334w5d Viable Singleton Intrauterine pregnancy dated by previously established criteria. 2. AFI is 21.5 cm. Recommendations: 1.Clinical correlation with the patient's History and Physical Exam. Deanna ArtisElyse S Fairbanks, RT There is a singleton gestation with normal amniotic fluid volume. TThe visualized fetal anatomical survey appears within normal limits within the resolution of ultrasound as described above.  It must be noted that a normal ultrasound is unable to rule out fetal aneuploidy.  Vena AustriaAndreas Davinity Fanara, MD, Merlinda FrederickFACOG [facility], [facility] 04/12/2019, 9:27 AM\  [place] Ob Limited  Result Date: 04/05/2019 Patient Name: Tiffany Chapman DOB: 10/03/1980 MRN: [id number] ULTRASOUND REPORT Location: [facility] Date of Service: 04/05/2019 Indications:AFI Findings: Mason JimSingleton intrauterine pregnancy is visualized with FHR at 135 BPM. Fetal presentation is Transverse. Placenta: anterior. Grade: 1 AFI: 17.7 cm Impression: 1. 1837w5d Viable Singleton Intrauterine pregnancy dated by previously established criteria. 2. AFI is 17.7 cm. 3. The fetal bladder appears distended measuring 50.1 x 27.6 x 28.3 mm. Recommendations: 1.Clinical correlation with the patient's History and Physical Exam. Deanna ArtisElyse S Fairbanks, RT There is a singleton  gestation with normal amniotic fluid volume. The fetal biometry correlates with established dating.  Limited fetal anatomy was performed.The visualized fetal anatomical survey appears within normal limits within the resolution of ultrasound as described  above.   Fetal bladder normal on all prior scan will monitor.   It must be noted that a normal ultrasound is unable to rule out fetal aneuploidy.  Malachy Mood, MD, [facility], [facility] 04/05/2019, 1:12 PM  US Ob Follow Up  Result Date: 04/29/2019 Patient Name: Tiffany Chapman DOB: August 06, 1981 MRN: [id number] ULTRASOUND REPORT Location: [facility] Date of Service: 04/29/2019 Indications:growth/afi Findings: Tiffany Chapman intrauterine pregnancy is visualized with FHR at 157 BPM. Biometrics give an (U/S) Gestational age of [redacted]w[redacted]d and an (U/S) EDD of 05/19/2019; this correlates with the clinically established Estimated Date of Delivery: 05/19/19. Fetal presentation is transverse. Placenta: right lateral. Grade: 1 AFI: 18.3 cm Growth percentile is 62.1%. EFW: 3234 g  ( 7 lb 2 oz ) Impression: 1. [redacted]w[redacted]d Viable Singleton Intrauterine pregnancy previously established criteria. 2. Growth is 62.1 %ile.  AFI is 18.3 cm. Recommendations: 1.Clinical correlation with the patient's History and Physical Exam. Gweneth Dimitri, RT There is a singleton gestation with normal amniotic fluid volume. Presentation transverse back up.  The fetal biometry correlates with established dating.  Limited fetal anatomy was performed.The visualized fetal anatomical survey appears within normal limits within the resolution of ultrasound as described above.  It must be noted that a normal ultrasound is unable to rule out fetal aneuploidy.  Malachy Mood, MD, [facility], [facility] 04/29/2019, 3:44 PM   US Ob Follow Up  Result Date: 04/22/2019 Patient Name: Tiffany Chapman DOB: December 03, 1980 MRN: [id number] ULTRASOUND REPORT Location: [facility] Date of Service: 04/22/2019 Indications:growth/afi Findings: Tiffany Chapman intrauterine pregnancy is visualized with FHR at 151 BPM. Biometrics give an (U/S) Gestational age of [redacted]w[redacted]d and an (U/S) EDD of 05/12/2019; this correlates with the clinically established Estimated Date of Delivery: 05/19/19.  Fetal presentation is transverse. Placenta: anterior. Grade: 1 AFI: 24.5 cm Growth percentile is 78.6. EFW: 3,293 g (7 lb 4 oz)  Impression: 1. [redacted]w[redacted]d Viable Singleton Intrauterine pregnancy previously established criteria. 2. Growth is 78.6 %ile.  AFI is 24.5 cm. 3. Transverse presentation 4. Polyhydramnios, mild Gweneth Dimitri, RT The ultrasound images and findings were reviewed by me and I agree with the above report. Prentice Docker, MD, [facility], [facility] 04/22/2019 1:38 PM       Assessment   38 y.o. [id number] at [redacted]w[redacted]d by  05/19/2019, by Last Menstrual Period presenting for routine prenatal visit  Plan   Pregnancy#5 Problems (from 08/12/18 to present)    Problem Noted Resolved   Supervision of high risk pregnancy, antepartum 01/14/2017 by Doristine Counter, CMA No   Priority:  High     Overview Addendum 04/19/2019  9:57 AM by Malachy Mood, MD     Clinic [facility] Prenatal Labs  Dating LMP = 9 week [place] Blood type: O negative  Genetic Screen NIPS: Normal XX, Inheritest negative Antibody: Negative  Anatomic [place] Normal Rubella: Immune Varicella: Immune  GTT 116-early; 28 wk 180 3-hr 106 / 204 / 151 / 113 RPR: Immune  Rhogam [X]  28 weeks HBsAg: negative  TDaP vaccine [ ]  30 weeks   HIV: negative  Baby Food Bottle                 GBS: negative  Contraception POPs Pap: 01/14/17 NIL HPV negative  Influenza Declined 3/9       Support Person Husband: [name] History of polyhydramnios last preganacy    HIgh risk for obesity with BMI=40, CHTN, habitual abortions, AMA, and anxiety/depression  Insulin controlled gestational diabetes mellitus (GDM) in third trimester  02/28/2019 by Vena AustriaStaebler, Glenford Garis, MD No   Overview Addendum 04/19/2019 10:01 AM by Vena AustriaStaebler, Gracieann Stannard, MD    Insulin start at 33 weeks 28 units lantus at bedtime, 4 units short acting with meals - increased to 34 units lantus 4 units aspart with meals (04/12/2019) - increased to 38 units lantus 4 - 6 - 6 units aspart with meals (04/19/2019)      History of polyhydramnios 10/07/2018 by Vena AustriaStaebler, Ziad Maye, MD No   Maternal obesity, antepartum 01/18/2017 by Farrel ConnersGutierrez, Colleen, CNM No   Overview Addendum 04/12/2019  6:09 PM by Vena AustriaStaebler, Nevea Spiewak, MD    BMI >=40 [X]  bASA (>12 weeks)       Chronic hypertension affecting pregnancy 01/18/2017 by Farrel ConnersGutierrez, Colleen, CNM No   Overview Addendum 03/22/2019  7:55 AM by Vena AustriaStaebler, Tyrihanna Wingert, MD    Monitor blood pressures. CMP and TSH WNL. Baseline PC ratio 98 mgm  [X]  Aspirin 81 mg daily after 12 weeks; discontinue after 36 weeks [X]  baseline labs with CBC, CMP, urine protein/creatinine ratio [ ]  no BP meds unless BPs become elevated >160/105 [ ]  ultrasound for growth at [X]  28 week 2lbs 9oz or 1154g c/w 55.2%ile, [x]  32 week 5lbs 1oz or 2283g c/w 78.9%ile AFI 18.3cm, [X] 36 weeks 3293g or 7lbs 4oz c/w 78.6%ile AFI 24.5cm follow up growth 37 weeks 3234g or 7lbs 2oz c/w 62.1%ile AFI 18.3cm (growth within the margin of error for ultrasound)   Current antihypertensives:  None   Baseline and surveillance labs [facility], refresh links as needed)  Lab Results  Component Value Date   PLT 283 01/23/2017   CREATININE 0.65 01/23/2017   AST 11 01/23/2017   ALT 7 01/23/2017    Antenatal Testing CHTN - O10.919  Group I  BP < 140/90, no preeclampsia, AGA,  nml AFV, +/- meds    Group II BP > 140/90, on meds, no preeclampsia, AGA, nml AFV  20-28-34-38  20-24-28-32-35-38  32//2 x wk  28//BPP wkly then 32//2 x wk  40 no meds; 39 meds  PRN or [id number]             Gestational age appropriate obstetric precautions including but not limited to vaginal  bleeding, contractions, leaking of fluid and fetal movement were reviewed in detail with the patient.    1) CHTN - BP mild range today again.  Prior evaluation negative on L&D.   - Asymptomatic, stable weight - Follow up BP and NST 05/03/2019 - Repeat preeclampsia labs sent  2) GDM - good glycemic control on current regimen - reactive NST today  Return in about 5 days (around 05/04/2019) for ROB/NST 8/26, ROB/NST AFI 8/28.  Vena AustriaAndreas Khaila Velarde, MD, Merlinda FrederickFACOG [facility], [facility] 04/29/2019, 3:43 PM

## 2019-04-30 LAB — COMPREHENSIVE METABOLIC PANEL
ALT: 18 IU/L (ref 0–32)
AST: 16 IU/L (ref 0–40)
Albumin/Globulin Ratio: 1.4 (ref 1.2–2.2)
Albumin: 3.4 g/dL — ABNORMAL LOW (ref 3.8–4.8)
Alkaline Phosphatase: 151 IU/L — ABNORMAL HIGH (ref 39–117)
BUN/Creatinine Ratio: 19 (ref 9–23)
BUN: 11 mg/dL (ref 6–20)
Bilirubin Total: <0.2 mg/dL (ref 0.0–1.2)
CO2: 20 mmol/L (ref 20–29)
Calcium: 9.1 mg/dL (ref 8.7–10.2)
Chloride: 103 mmol/L (ref 96–106)
Creatinine, Ser: 0.57 mg/dL (ref 0.57–1.00)
GFR calc Af Amer: 136 mL/min/{1.73_m2} (ref >59)
GFR calc non Af Amer: 118 mL/min/{1.73_m2} (ref >59)
Globulin, Total: 2.5 g/dL (ref 1.5–4.5)
Glucose: 101 mg/dL — ABNORMAL HIGH (ref 65–99)
Potassium: 4.6 mmol/L (ref 3.5–5.2)
Sodium: 139 mmol/L (ref 134–144)
Total Protein: 5.9 g/dL — ABNORMAL LOW (ref 6.0–8.5)

## 2019-04-30 LAB — CBC
Hematocrit: 33.8 % — ABNORMAL LOW (ref 34.0–46.6)
Hemoglobin: 11.8 g/dL (ref 11.1–15.9)
MCH: 32.3 pg (ref 26.6–33.0)
MCHC: 34.9 g/dL (ref 31.5–35.7)
MCV: 93 fL (ref 79–97)
Platelets: 233 10*3/uL (ref 150–450)
RBC: 3.65 x10E6/uL — ABNORMAL LOW (ref 3.77–5.28)
RDW: 12 % (ref 11.7–15.4)
WBC: 11 10*3/uL — ABNORMAL HIGH (ref 3.4–10.8)

## 2019-05-01 LAB — PROTEIN / CREATININE RATIO, URINE
Creatinine, Urine: 107 mg/dL
Protein, Ur: 16.4 mg/dL
Protein/Creat Ratio: 153 mg/g creat (ref 0–200)

## 2019-05-03 ENCOUNTER — Observation Stay
Admission: EM | Admit: 2019-05-03 | Discharge: 2019-05-03 | Disposition: A | Discharge status: Home / Self Care | Payer: BLUE CROSS/BLUE SHIELD | Attending: Obstetrics and Gynecology | Admitting: Obstetrics and Gynecology

## 2019-05-03 ENCOUNTER — Ambulatory Visit (INDEPENDENT_AMBULATORY_CARE_PROVIDER_SITE_OTHER): Payer: BLUE CROSS/BLUE SHIELD | Admitting: Obstetrics and Gynecology

## 2019-05-03 ENCOUNTER — Other Ambulatory Visit: Payer: BLUE CROSS/BLUE SHIELD

## 2019-05-03 ENCOUNTER — Encounter: Payer: Self-pay | Admitting: Obstetrics and Gynecology

## 2019-05-03 ENCOUNTER — Other Ambulatory Visit: Payer: Self-pay

## 2019-05-03 ENCOUNTER — Encounter: Payer: Self-pay | Admitting: *Deleted

## 2019-05-03 VITALS — BP 162/98 | HR 93 | Ht 67.0 in | Wt 278.0 lb

## 2019-05-03 DIAGNOSIS — Z3A37 37 weeks gestation of pregnancy: Secondary | ICD-10-CM

## 2019-05-03 DIAGNOSIS — O99213 Obesity complicating pregnancy, third trimester: Secondary | ICD-10-CM | POA: Insufficient documentation

## 2019-05-03 DIAGNOSIS — O24419 Gestational diabetes mellitus in pregnancy, unspecified control: Secondary | ICD-10-CM | POA: Insufficient documentation

## 2019-05-03 DIAGNOSIS — R87810 Cervical high risk human papillomavirus (HPV) DNA test positive: Secondary | ICD-10-CM | POA: Insufficient documentation

## 2019-05-03 DIAGNOSIS — Z7982 Long term (current) use of aspirin: Secondary | ICD-10-CM | POA: Diagnosis not present

## 2019-05-03 DIAGNOSIS — F419 Anxiety disorder, unspecified: Secondary | ICD-10-CM | POA: Diagnosis not present

## 2019-05-03 DIAGNOSIS — O09523 Supervision of elderly multigravida, third trimester: Secondary | ICD-10-CM | POA: Insufficient documentation

## 2019-05-03 DIAGNOSIS — O99343 Other mental disorders complicating pregnancy, third trimester: Secondary | ICD-10-CM | POA: Diagnosis not present

## 2019-05-03 DIAGNOSIS — O09293 Supervision of pregnancy with other poor reproductive or obstetric history, third trimester: Secondary | ICD-10-CM

## 2019-05-03 DIAGNOSIS — O24414 Gestational diabetes mellitus in pregnancy, insulin controlled: Secondary | ICD-10-CM

## 2019-05-03 DIAGNOSIS — O10919 Unspecified pre-existing hypertension complicating pregnancy, unspecified trimester: Secondary | ICD-10-CM

## 2019-05-03 DIAGNOSIS — Z794 Long term (current) use of insulin: Secondary | ICD-10-CM | POA: Diagnosis not present

## 2019-05-03 DIAGNOSIS — Z20828 Contact with and (suspected) exposure to other viral communicable diseases: Secondary | ICD-10-CM | POA: Diagnosis not present

## 2019-05-03 DIAGNOSIS — O99210 Obesity complicating pregnancy, unspecified trimester: Secondary | ICD-10-CM

## 2019-05-03 DIAGNOSIS — O10913 Unspecified pre-existing hypertension complicating pregnancy, third trimester: Secondary | ICD-10-CM

## 2019-05-03 DIAGNOSIS — Z79899 Other long term (current) drug therapy: Secondary | ICD-10-CM | POA: Diagnosis not present

## 2019-05-03 DIAGNOSIS — O0990 Supervision of high risk pregnancy, unspecified, unspecified trimester: Secondary | ICD-10-CM

## 2019-05-03 DIAGNOSIS — Z87891 Personal history of nicotine dependence: Secondary | ICD-10-CM | POA: Diagnosis not present

## 2019-05-03 DIAGNOSIS — Z8249 Family history of ischemic heart disease and other diseases of the circulatory system: Secondary | ICD-10-CM | POA: Insufficient documentation

## 2019-05-03 DIAGNOSIS — Z8759 Personal history of other complications of pregnancy, childbirth and the puerperium: Secondary | ICD-10-CM

## 2019-05-03 LAB — CBC
HCT: 34 % — ABNORMAL LOW (ref 36.0–46.0)
Hemoglobin: 11.2 g/dL — ABNORMAL LOW (ref 12.0–15.0)
MCH: 31.8 pg (ref 26.0–34.0)
MCHC: 32.9 g/dL (ref 30.0–36.0)
MCV: 96.6 fL (ref 80.0–100.0)
Platelets: 216 10*3/uL (ref 150–400)
RBC: 3.52 MIL/uL — ABNORMAL LOW (ref 3.87–5.11)
RDW: 12.9 % (ref 11.5–15.5)
WBC: 10.1 10*3/uL (ref 4.0–10.5)
nRBC: 0 % (ref 0.0–0.2)

## 2019-05-03 LAB — PROTEIN / CREATININE RATIO, URINE
Creatinine, Urine: 52 mg/dL
Protein Creatinine Ratio: 0.15 mg/mg{Cre} (ref 0.00–0.15)
Total Protein, Urine: 8 mg/dL

## 2019-05-03 LAB — COMPREHENSIVE METABOLIC PANEL
ALT: 18 U/L (ref 0–44)
AST: 19 U/L (ref 15–41)
Albumin: 2.6 g/dL — ABNORMAL LOW (ref 3.5–5.0)
Alkaline Phosphatase: 122 U/L (ref 38–126)
Anion gap: 9 (ref 5–15)
BUN: 12 mg/dL (ref 6–20)
CO2: 18 mmol/L — ABNORMAL LOW (ref 22–32)
Calcium: 8.5 mg/dL — ABNORMAL LOW (ref 8.9–10.3)
Chloride: 108 mmol/L (ref 98–111)
Creatinine, Ser: 0.47 mg/dL (ref 0.44–1.00)
GFR calc Af Amer: >60 mL/min (ref >60)
GFR calc non Af Amer: >60 mL/min (ref >60)
Glucose, Bld: 112 mg/dL — ABNORMAL HIGH (ref 70–99)
Potassium: 4.1 mmol/L (ref 3.5–5.1)
Sodium: 135 mmol/L (ref 135–145)
Total Bilirubin: 0.5 mg/dL (ref 0.3–1.2)
Total Protein: 6 g/dL — ABNORMAL LOW (ref 6.5–8.1)

## 2019-05-03 LAB — SARS CORONAVIRUS 2 (TAT 6-24 HRS): SARS Coronavirus 2: NEGATIVE

## 2019-05-03 NOTE — Discharge Summary (Signed)
Patient discharged home, discharge instructions given, patient states understanding. Patient left floor in stable condition, denies any other needs at this time. Patient to  Return on Thursday at 0700 for scheduled C Section.

## 2019-05-03 NOTE — Discharge Summary (Signed)
Physician Final Progress Note  Patient ID: Tiffany Chapman MRN: [id number] DOB/AGE: [birth year] 38 y.o.  Admit date: 05/03/2019 Admitting provider: Homero Fellers, MD Discharge date: 05/03/2019   Admission Diagnoses: elevated blood pressure in clinic  Discharge Diagnoses:  Active Problems:   Indication for care in labor and delivery, antepartum IUP at 75w5dReactive NST Chronic hypertension Gestational Diabetes  History of Present Illness: The patient is a 38y.o. female [id number] [id number] presents for severe range blood pressure while in clinic this morning. She denies headache, visual changes or epigastric pain. She reports positive fetal movement. She denies contractions, leakage of fluid or vaginal bleeding.   She was scheduled for c/section next week. Her surgery has been rescheduled for 2 days from now on 05/05/2019. Her PISteele Chapman are within normal limits today.   Pre-procedure Covid swab done today prior to discharge from triage.  Past Medical History:  Diagnosis Date  . Anxiety   . 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 Dysmenorrhea   . Gestational diabetes   . Habitual aborter, currently pregnant   . Hypertension    labetalol discontinued in December 2017  . Missed abortion 02/2016, 06/2016  . Obesity     Past Surgical History:  Procedure Laterality Date  . BREAST ENHANCEMENT SURGERY Bilateral   . CESAREAN SECTION N/A 08/25/2017   Procedure: CESAREAN SECTION;  Surgeon: Tiffany MoodMD;  Location: [facility] ORS;  Service: Obstetrics;  Laterality: N/A;  birth at 1862eight 9lb 0oz apgar 9/9  . COLPOSCOPY  01/01/2012   Benign  . DILATION AND EVACUATION N/A 02/15/2016   Procedure: DILATATION AND EVACUATION;  Surgeon: Tiffany MoodMD;  Location: [facility] ORS;  Service: Gynecology;  Laterality: N/A;  . DILATION AND EVACUATION N/A 06/10/2016   Procedure: DILATATION AND EVACUATION;  Surgeon: Tiffany MoodMD;  Location: [facility] ORS;  Service:  Gynecology;  Laterality: N/A;  . TONSILLECTOMY  age 38  No current facility-administered medications on file prior to encounter.    Current Outpatient Medications on File Prior to Encounter  Medication Sig Dispense Refill  . Accu-Chek FastClix Lancets MISC 1 Units by Percutaneous route 4 (four) times daily. 100 each 12  . aspirin EC 81 MG tablet Take 1 tablet (81 mg total) by mouth daily. Take after 12 weeks for prevention of preeclampssia later in pregnancy 300 tablet 2  . Blood Glucose Monitoring Suppl (ACCU-CHEK GUIDE) w/Device KIT 1 kit by Does not apply route as directed. 1 kit 0  . escitalopram (LEXAPRO) 10 MG tablet Take 1 tablet (10 mg total) by mouth daily. 90 tablet 3  . glucose blood (ACCU-CHEK GUIDE) test strip Use as instructed 100 each 12  . insulin aspart (NOVOLOG) 100 UNIT/ML FlexPen Inject 4 Units into the skin 3 (three) times daily with meals. 15 mL 11  . Insulin Glargine (LANTUS) 100 UNIT/ML Solostar Pen Inject 28 Units into the skin at bedtime. 15 mL 11  . Insulin Pen Needle (NOVOFINE) 32G X 6 MM MISC Use as directed 100 each 3  . Prenatal Vit-Fe Fumarate-FA (PRENATAL VITAMINS PO) Take 1 tablet by mouth daily.      No Known Allergies  Social History   Socioeconomic History  . Marital status: Married    Spouse name: Not on file  . Number of children: 0  . Years of education: Not on file  . Highest education level: Not on file  Occupational History  . Not on file  Social Needs  .  Financial resource strain: Not on file  . Food insecurity    Worry: Not on file    Inability: Not on file  . Transportation needs    Medical: Not on file    Non-medical: Not on file  Tobacco Use  . Smoking status: Former Smoker    Packs/day: 0.50    Years: 15.00    Pack years: 7.50    Types: Cigarettes    Quit date: 12/30/2016    Years since quitting: 2.3  . Smokeless tobacco: Former Network engineer and Sexual Activity  . Alcohol use: No  . Drug use: No  . Sexual  activity: Yes    Birth control/protection: None  Lifestyle  . Physical activity    Days per week: Not on file    Minutes per session: Not on file  . Stress: Not on file  Relationships  . Social Herbalist on phone: Not on file    Gets together: Not on file    Attends religious service: Not on file    Active member of club or organization: Not on file    Attends meetings of clubs or organizations: Not on file    Relationship status: Not on file  . Intimate partner violence    Fear of current or ex partner: Not on file    Emotionally abused: Not on file    Physically abused: Not on file    Forced sexual activity: Not on file  Other Topics Concern  . Not on file  Social History Narrative   [name] is FOB, and involved with care    Family History  Problem Relation Age of Onset  . Diabetes Mother   . Multiple sclerosis Mother   . Hypertension Mother   . Lung cancer Maternal Grandmother 61  . Lung cancer Father        died at age 6  . Hypertension Father   . Diabetes Father      Review of Systems  Constitutional: Negative.   HENT: Negative.   Eyes: Negative.   Respiratory: Negative.   Cardiovascular: Negative.   Gastrointestinal: Negative.   Genitourinary: Negative.   Musculoskeletal: Negative.   Skin: Negative.   Neurological: Negative.   Endo/Heme/Allergies: Negative.   Psychiatric/Behavioral: Negative.      Physical Exam: BP (!) 144/85   Pulse 97   Resp 18   LMP 08/12/2018 (Exact Date)   Constitutional: Well nourished, well developed female in no acute distress.  HEENT: normal Skin: Warm and dry.  Cardiovascular: Regular rate and rhythm.   Extremity: trace edema lower extremities  Respiratory: Clear to auscultation bilateral. Normal respiratory effort Abdomen: FHT present Back: no CVAT Neuro: DTRs 2+, Cranial nerves grossly intact Psych: Alert and Oriented x3. No memory deficits. Normal Chapman and affect.  MS: normal gait, normal bilateral  lower extremity ROM/strength/stability.  Pelvic exam: deferred Toco: contractions absent Fetal well being: 130 bpm baseline, moderate variability, +accelerations, -decelerations  Consults: None  Significant Findings/ Diagnostic Studies: labs:   Results for Tiffany, Chapman (MRN [id number]) as of 05/03/2019 11:58  Ref. Range 05/03/2019 10:25 05/03/2019 10:40  COMPREHENSIVE METABOLIC PANEL Unknown  Rpt (A)  Sodium Latest Ref Range: 135 - 145 mmol/L  135  Potassium Latest Ref Range: 3.5 - 5.1 mmol/L  4.1  Chloride Latest Ref Range: 98 - 111 mmol/L  108  CO2 Latest Ref Range: 22 - 32 mmol/L  18 (L)  Glucose Latest Ref Range: 70 - 99 mg/dL  112 (  H)  BUN Latest Ref Range: 6 - 20 mg/dL  12  Creatinine Latest Ref Range: 0.44 - 1.00 mg/dL  0.47  Calcium Latest Ref Range: 8.9 - 10.3 mg/dL  8.5 (L)  Anion gap Latest Ref Range: 5 - 15   9  Alkaline Phosphatase Latest Ref Range: 38 - 126 U/L  122  Albumin Latest Ref Range: 3.5 - 5.0 g/dL  2.6 (L)  AST Latest Ref Range: 15 - 41 U/L  19  ALT Latest Ref Range: 0 - 44 U/L  18  Total Protein Latest Ref Range: 6.5 - 8.1 g/dL  6.0 (L)  Total Bilirubin Latest Ref Range: 0.3 - 1.2 mg/dL  0.5  GFR, Est Non African American Latest Ref Range: >60 mL/min  >60  GFR, Est African American Latest Ref Range: >60 mL/min  >60  WBC Latest Ref Range: 4.0 - 10.5 K/uL  10.1  RBC Latest Ref Range: 3.87 - 5.11 MIL/uL  3.52 (L)  Hemoglobin Latest Ref Range: 12.0 - 15.0 g/dL  11.2 (L)  HCT Latest Ref Range: 36.0 - 46.0 %  34.0 (L)  MCV Latest Ref Range: 80.0 - 100.0 fL  96.6  MCH Latest Ref Range: 26.0 - 34.0 pg  31.8  MCHC Latest Ref Range: 30.0 - 36.0 g/dL  32.9  RDW Latest Ref Range: 11.5 - 15.5 %  12.9  Platelets Latest Ref Range: 150 - 400 K/uL  216  nRBC Latest Ref Range: 0.0 - 0.2 %  0.0  Total Protein, Urine Latest Units: mg/dL 8   Protein Creatinine Ratio Latest Ref Range: 0.00 - 0.15 mg/mgCre 0.15   Creatinine, Urine Latest Units: mg/dL 52     Procedures:  NST  Hospital Course: The patient was admitted to Labor and Delivery Triage for observation.   Discharge Condition: good  Disposition: Discharge disposition: 01-Home or Self Care     Diet: Diabetic diet  Discharge Activity: Activity as tolerated  Discharge Instructions    Discharge activity:  No Restrictions   Complete by: As directed    Discharge diet:  No restrictions   Complete by: As directed    Fetal Kick Count:  Lie on our left side for one hour after a meal, and count the number of times your baby kicks.  If it is less than 5 times, get up, move around and drink some juice.  Repeat the test 30 minutes later.  If it is still less than 5 kicks in Tiffany hour, notify your doctor.   Complete by: As directed    LABOR:  When conractions begin, you should start to time them from the beginning of one contraction to the beginning  of the next.  When contractions are 5 - 10 minutes apart or less and have been regular for at least Tiffany hour, you should call your health care provider.   Complete by: As directed    No sexual activity restrictions   Complete by: As directed    Notify physician for bleeding from the vagina   Complete by: As directed    Notify physician for blurring of vision or spots before the eyes   Complete by: As directed    Notify physician for chills or fever   Complete by: As directed    Notify physician for fainting spells, "black outs" or loss of consciousness   Complete by: As directed    Notify physician for increase in vaginal discharge   Complete by: As directed    Notify physician for  leaking of fluid   Complete by: As directed    Notify physician for pain or burning when urinating   Complete by: As directed    Notify physician for pelvic pressure (sudden increase)   Complete by: As directed    Notify physician for severe or continued nausea or vomiting   Complete by: As directed    Notify physician for sudden gushing of fluid from the vagina (with or  without continued leaking)   Complete by: As directed    Notify physician for sudden, constant, or occasional abdominal pain   Complete by: As directed    Notify physician if baby moving less than usual   Complete by: As directed      Allergies as of 05/03/2019   No Known Allergies     Medication List    STOP taking these medications   aspirin EC 81 MG tablet     TAKE these medications   Accu-Chek FastClix Lancets Misc 1 Units by Percutaneous route 4 (four) times daily.   Accu-Chek Guide test strip Generic drug: glucose blood Use as instructed   Accu-Chek Guide w/Device Kit 1 kit by Does not apply route as directed.   escitalopram 10 MG tablet Commonly known as: Lexapro Take 1 tablet (10 mg total) by mouth daily.   insulin aspart 100 UNIT/ML FlexPen Commonly known as: NOVOLOG Inject 4 Units into the skin 3 (three) times daily with meals.   Insulin Glargine 100 UNIT/ML Solostar Pen Commonly known as: LANTUS Inject 28 Units into the skin at bedtime.   NovoFine 32G X 6 MM Misc Generic drug: Insulin Pen Needle Use as directed   PRENATAL VITAMINS PO Take 1 tablet by mouth daily.        Total time spent taking care of this patient: 15 minutes  Signed: Rod Can, CNM  05/03/2019, 11:51 AM

## 2019-05-03 NOTE — Discharge Instructions (Signed)
Call your provider for any concerns.  You have a scheduled C/S Thursday 05/05/2019 at 0905am. Please arrive to [facility] for preop at 0700am.

## 2019-05-03 NOTE — Progress Notes (Signed)
Routine Prenatal Care Visit  Subjective  Tiffany Chapman is a 38 y.o. [id number] at [redacted]w[redacted]d being seen today for ongoing prenatal care.  She is currently monitored for the following issues for this high-risk pregnancy and has Supervision of high risk pregnancy, antepartum; Maternal obesity, antepartum; BMI 40.0-44.9, adult ([place]); Chronic hypertension affecting pregnancy; Anxiety and depression; History of polyhydramnios; and Insulin controlled gestational diabetes mellitus (GDM) in third trimester on their problem list.  ----------------------------------------------------------------------------------- Patient reports no complaints.    .  .   . Denies leaking of fluid.  ----------------------------------------------------------------------------------- The following portions of the patient's history were reviewed and updated as appropriate: allergies, current medications, past family history, past medical history, past social history, past surgical history and problem list. Problem list updated.   Objective  Blood pressure (!) 162/98, pulse 93, height 5\' 7"  (1.702 m), weight 278 lb (126.1 kg), last menstrual period 08/12/2018, not currently breastfeeding.  REPEAT BP 170/100  Pregravid weight 256 lb (116.1 kg) Total Weight Gain 20 lb (9.072 kg) Urinalysis:      Fetal Status:         FHT 140, moderate, +accels, no decels  General:  Alert, oriented and cooperative. Patient is in no acute distress.  Skin: Skin is warm and dry. No rash noted.   Cardiovascular: Normal heart rate noted  Respiratory: Normal respiratory effort, no problems with respiration noted  Abdomen: Soft, gravid, appropriate for gestational age.       Pelvic:  Cervical exam deferred        Extremities: Normal range of motion.     ental Status: Normal mood and affect. Normal behavior. Normal judgment and thought content.     Assessment   38 y.o. [id number] at [redacted]w[redacted]d by  05/19/2019, by Last Menstrual Period presenting for  routine prenatal visit  Plan   Pregnancy#5 Problems (from 08/12/18 to present)    Problem Noted Resolved   Supervision of high risk pregnancy, antepartum 01/14/2017 by Doristine Counter, CMA No   Priority:  High     Overview Addendum 04/29/2019  3:46 PM by Malachy Mood, MD     Clinic [facility] Prenatal Labs  Dating LMP = 9 week [place] Blood type: O negative  Genetic Screen NIPS: Normal XX, Inheritest negative Antibody: Negative  Anatomic [place] Normal Rubella: Immune Varicella: Immune  GTT 116-early; 28 wk 180 3-hr 106 / 204 / 151 / 113 RPR: Immune  Rhogam [X]  28 weeks HBsAg: negative  TDaP vaccine [ ]  30 weeks   HIV: negative  Baby Food Bottle                 GBS: negative  Contraception POPs Pap: 01/14/17 NIL HPV negative  Influenza Declined 3/9       Support Person Husband: Tiffany Chapman History of polyhydramnios last preganacy    HIgh risk for obesity with BMI=40, CHTN, habitual abortions, AMA, and anxiety/depression        Insulin controlled gestational diabetes mellitus (GDM) in third trimester 02/28/2019 by Malachy Mood, MD No   Overview Addendum 04/19/2019 10:01 AM by Malachy Mood, MD    Insulin start at 33 weeks 28 units lantus at bedtime, 4 units short acting with meals - increased to 34 units lantus 4 units aspart with meals (04/12/2019) - increased to 38 units lantus 4 - 6 - 6 units aspart with meals (04/19/2019)      History of polyhydramnios 10/07/2018 by Malachy Mood, MD No   Maternal obesity, antepartum 01/18/2017 by Dalia Heading,  CNM No   Overview Addendum 04/12/2019  6:09 PM by Vena AustriaStaebler, Paraskevi Funez, MD    BMI >=40 [X]  bASA (>12 weeks)       Chronic hypertension affecting pregnancy 01/18/2017 by Farrel ConnersGutierrez, Colleen, CNM No   Overview Addendum 04/29/2019  7:19 PM by Vena AustriaStaebler, Eudora Guevarra, MD    Monitor blood pressures. CMP and TSH WNL. Baseline PC ratio 98 mgm  [X]  Aspirin 81 mg daily after 12 weeks; discontinue after 36 weeks [X]  baseline labs with CBC, CMP, urine  protein/creatinine ratio [ ]  no BP meds unless BPs become elevated [ ]  ultrasound for growth at [X]  28 week 2lbs 9oz or 1154g c/w 55.2%ile, [x]  32 week 5lbs 1oz or 2283g c/w 78.9%ile AFI 18.3cm, [X] 36 weeks 3293g or 7lbs 4oz c/w 78.6%ile AFI 24.5cm follow up growth 37 weeks 3234g or 7lbs 2oz c/w 62.1%ile AFI 18.3cm (growth within the margin of error for ultrasound)  Current antihypertensives:  None   Baseline and surveillance labs ([facility], refresh links as needed)  Lab Results  Component Value Date   PLT 283 01/23/2017   CREATININE 0.65 01/23/2017   AST 11 01/23/2017   ALT 7 01/23/2017    Antenatal Testing CHTN - O10.919  Group I  BP < 140/90, no preeclampsia, AGA,  nml AFV, +/- meds    Group II BP > 140/90, on meds, no preeclampsia, AGA, nml AFV  20-28-34-38  20-24-28-32-35-38  32//2 x wk  28//BPP wkly then 32//2 x wk  40 no meds; 39 meds  PRN or 4137             Gestational age appropriate obstetric precautions including but not limited to vaginal bleeding, contractions, leaking of fluid and fetal movement were reviewed in detail with the patient.   BG at gooal other than dinner running slight  Move C-section to 05/05/2019 given increasing BP  Return in about 3 days (around 05/06/2019) for NST/AFI.  Vena AustriaAndreas Burnett Lieber, MD, Evern CoreFACOG [facility], [facility] 05/03/2019, 9:08 AM

## 2019-05-03 NOTE — Progress Notes (Signed)
Obstetric H&P   Chief Complaint: ROB  Prenatal Care Provider: [facility]  History of Present Illness: 38 y.o. [id number] 36w5dby 05/19/2019, by Last Menstrual Period presenting to L&D BP elevated today with repeat 170/100.  No headaches.  Was on labetalol outside of pregnancy not currently.  BG well controlled.+FM, no LOF, no VB, irregular contractions.  NO headaches vision changes   Pregravid weight 256 lb (116.1 kg) Total Weight Gain 20 lb (9.072 kg)  Pregnancy#5 Problems (from 08/12/18 to present)    Problem Noted Resolved   Supervision of high risk pregnancy, antepartum 01/14/2017 by TDoristine Counter CMA No   Priority:  High     Overview Addendum 04/29/2019  3:46 PM by SMalachy Mood MD     [facility]  Dating LMP = 9 week UKoreaBlood type: O negative  Genetic Screen NIPS: Normal XX, Inheritest negative Antibody: Negative  Anatomic UKoreaNormal Rubella: Immune Varicella: Immune  GTT 116-early; 28 wk 180 3-hr 106 / 204 / 151 / 113 RPR: Immune  Rhogam _0  28 weeks HBsAg: negative  TDaP vaccine _1  30 weeks   HIV: negative  Baby Food Bottle                 GBS: negative  Contraception POPs Pap: 01/14/17 NIL HPV negative  Influenza Declined 3/9       Support Person Husband: AMitzi HansenHistory of polyhydramnios last preganacy    HIgh risk for obesity with BMI=40, CHTN, habitual abortions, AMA, and anxiety/depression        Insulin controlled gestational diabetes mellitus (GDM) in third trimester 02/28/2019 by SMalachy Mood MD No   Overview Addendum 04/19/2019 10:01 AM by SMalachy Mood MD    Insulin start at 33 weeks 28 units lantus at bedtime, 4 units short acting with meals - increased to 34 units lantus 4 units aspart with meals (04/12/2019) - increased to 38 units lantus 4 - 6 - 6 units aspart with meals (04/19/2019)      History of polyhydramnios 10/07/2018 by SMalachy Mood MD No   Maternal obesity, antepartum 01/18/2017 by GDalia Heading CNM No   Overview  Addendum 04/12/2019  6:09 PM by SMalachy Mood MD    BMI >=40 _2  bASA (>12 weeks)       Chronic hypertension affecting pregnancy 01/18/2017 by GDalia Heading CNM No   Overview Addendum 04/29/2019  7:19 PM by SMalachy Mood MD    Monitor blood pressures. CMP and TSH WNL. Baseline PC ratio 98 mgm  _3  Aspirin 81 mg daily after 12 weeks; discontinue after 36 weeks _4  baseline labs with CBC, CMP, urine protein/creatinine ratio _5  no BP meds unless BPs become elevated _6  ultrasound for growth at _7  28 week 2lbs 9oz or 1154g c/w 55.2%ile, _8  32 week 5lbs 1oz or 2283g c/w 78.9%ile AFI 18.3cm, _9 36 weeks 3293g or 7lbs 4oz c/w 78.6%ile AFI 24.5cm follow up growth 37 weeks 3234g or 7lbs 2oz c/w 62.1%ile AFI 18.3cm (growth within the margin of error for ultrasound)  Current antihypertensives:  None   Baseline and surveillance labs (pulled in from [facility] refresh links as needed)  Lab Results  Component Value Date   PLT 283 01/23/2017   CREATININE 0.65 01/23/2017   AST 11 01/23/2017   ALT 7 01/23/2017    Antenatal Testing CHTN - O10.919  Group I  BP < 140/90, no preeclampsia, AGA,  nml AFV, +/- meds    Group II BP > 140/90, on meds, no preeclampsia, AGA,  nml AFV  20-28-34-38  20-24-28-32-35-38  32//2 x wk  28//BPP wkly then 32//2 x wk  40 no meds; 39 meds  PRN or 37             Review of Systems: 10 point review of systems negative unless otherwise noted in HPI  Past Medical History: Past Medical History:  Diagnosis Date  . Anxiety   . 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 Dysmenorrhea   . Gestational diabetes   . Habitual aborter, currently pregnant   . Hypertension    labetalol discontinued in December 2017  . Missed abortion 02/2016, 06/2016  . Obesity     Past Surgical History: Past Surgical History:  Procedure Laterality Date  . BREAST ENHANCEMENT SURGERY Bilateral   . CESAREAN SECTION N/A 08/25/2017   Procedure: CESAREAN  SECTION;  Surgeon: Malachy Mood, MD;  Location: [facility] ORS;  Service: Obstetrics;  Laterality: N/A;  birth at 7 weight 9lb 0oz apgar 9/9  . COLPOSCOPY  01/01/2012   Benign  . DILATION AND EVACUATION N/A 02/15/2016   Procedure: DILATATION AND EVACUATION;  Surgeon: Malachy Mood, MD;  Location: [facility] ORS;  Service: Gynecology;  Laterality: N/A;  . DILATION AND EVACUATION N/A 06/10/2016   Procedure: DILATATION AND EVACUATION;  Surgeon: Malachy Mood, MD;  Location: [facility] ORS;  Service: Gynecology;  Laterality: N/A;  . TONSILLECTOMY  age 59    Past Obstetric History: # 1 - Date: None, Sex: None, Weight: None, GA: None, Delivery: None, Apgar1: None, Apgar5: None, Living: None, Birth Comments: None  # 2 - Date: 02/23/16, Sex: None, Weight: None, GA: None, Delivery: None, Apgar1: None, Apgar5: None, Living: None, Birth Comments: None  # 3 - Date: 06/10/16, Sex: None, Weight: None, GA: None, Delivery: None, Apgar1: None, Apgar5: None, Living: None, Birth Comments: None  # 4 - Date: 08/25/17, Sex: Female, Weight: 8 lb 15.6 oz (4.07 kg), GA: [redacted]w[redacted]d Delivery: C-Section, Low Transverse, Apgar1: 9, Apgar5: 9, Living: Living, Birth Comments: None  # 5 - Date: None, Sex: None, Weight: None, GA: None, Delivery: None, Apgar1: None, Apgar5: None, Living: None, Birth Comments: None   Past Gynecologic History:  Family History: Family History  Problem Relation Age of Onset  . Diabetes Mother   . Multiple sclerosis Mother   . Hypertension Mother   . Lung cancer Maternal Grandmother 557 . Lung cancer Father        died at age 38 . Hypertension Father   . Diabetes Father     Social History: Social History   Socioeconomic History  . Marital status: Married    Spouse name: Not on file  . Number of children: 0  . Years of education: Not on file  . Highest education level: Not on file  Occupational History  . Not on file  Social Needs  . Financial resource strain: Not on file  . Food  insecurity    Worry: Not on file    Inability: Not on file  . Transportation needs    Medical: Not on file    Non-medical: Not on file  Tobacco Use  . Smoking status: Former Smoker    Packs/day: 0.50    Years: 15.00    Pack years: 7.50    Types: Cigarettes    Quit date: 12/30/2016    Years since quitting: 2.3  . Smokeless tobacco: Former UNetwork engineerand Sexual Activity  . Alcohol use: No  . Drug use: No  .  Sexual activity: Yes    Birth control/protection: None  Lifestyle  . Physical activity    Days per week: Not on file    Minutes per session: Not on file  . Stress: Not on file  Relationships  . Social Herbalist on phone: Not on file    Gets together: Not on file    Attends religious service: Not on file    Active member of club or organization: Not on file    Attends meetings of clubs or organizations: Not on file    Relationship status: Not on file  . Intimate partner violence    Fear of current or ex partner: Not on file    Emotionally abused: Not on file    Physically abused: Not on file    Forced sexual activity: Not on file  Other Topics Concern  . Not on file  Social History Narrative   [name] is FOB, and involved with care    Medications: Prior to Admission medications   Medication Sig Start Date End Date Taking? Authorizing Provider  Accu-Chek FastClix Lancets MISC 1 Units by Percutaneous route 4 (four) times daily. 02/28/19  Yes Malachy Mood, MD  aspirin EC 81 MG tablet Take 1 tablet (81 mg total) by mouth daily. Take after 12 weeks for prevention of preeclampssia later in pregnancy 11/15/18  Yes Malachy Mood, MD  Blood Glucose Monitoring Suppl (ACCU-CHEK GUIDE) w/Device KIT 1 kit by Does not apply route as directed. 02/28/19  Yes Malachy Mood, MD  glucose blood (ACCU-CHEK GUIDE) test strip Use as instructed 02/28/19  Yes Malachy Mood, MD  insulin aspart (NOVOLOG) 100 UNIT/ML FlexPen Inject 4 Units into the skin 3 (three)  times daily with meals. 04/05/19  Yes Malachy Mood, MD  Insulin Glargine (LANTUS) 100 UNIT/ML Solostar Pen Inject 28 Units into the skin at bedtime. 04/05/19  Yes Malachy Mood, MD  Insulin Pen Needle (NOVOFINE) 32G X 6 MM MISC Use as directed 04/05/19  Yes Malachy Mood, MD  Prenatal Vit-Fe Fumarate-FA (PRENATAL VITAMINS PO) Take 1 tablet by mouth daily.   Yes [provider]  escitalopram (LEXAPRO) 10 MG tablet Take 1 tablet (10 mg total) by mouth daily. 04/09/18 04/09/19  Malachy Mood, MD    Allergies: No Known Allergies  Physical Exam: Vitals: Blood pressure (!) 162/98, pulse 93, height _0  (1.702 m), weight 278 lb (126.1 kg), last menstrual period 08/12/2018, not currently breastfeeding.    FHT: 140, moderate, +accels, no decels  General: NAD HEENT: normocephalic, anicteric Pulmonary: No increased work of breathing Cardiovascular: RRR, distal pulses 2+ Abdomen: Gravid, non-tender  Extremities: no edema, erythema, or tenderness Neurologic: Grossly intact Psychiatric: mood appropriate, affect full  Labs: No results found for this or any previous visit (from the past 24 hour(s)).  Assessment: 38 y.o. [id number] 69w5dby 05/19/2019, by Last Menstrual Periodrepeat C-section  Plan: 1) Repeat C-section move up to 8/27  2) Fetus - reassuring monitoring  3) PNL - Blood type O/Negative/-- (01/27 1553) / Anti-bodyscreen Negative (06/16 0928) / Rubella 12.10 (01/27 1553) / VaricellaImmune / RPR Non Reactive (06/16 0928) / HBsAg Negative (01/27 1553) / HIV Non Reactive (06/16 02025 /  GBS Negative (08/14 1625)  4) Immunization History -  Immunization History  Administered Date(s) Administered  . Influenza,inj,Quad PF,6+ Mos 06/18/2017  . Tdap 07/02/2017    5) Disposition -   AMalachy Mood MD, [place] 05/03/2019, 9:11 AM

## 2019-05-03 NOTE — OB Triage Note (Signed)
Sent from office by Dr Georgianne Fick for elevated BP. Patient  desnies any HA, visual chages, epigastric pain or any other complaints. States baby is moving well.

## 2019-05-04 ENCOUNTER — Encounter
Admission: RE | Admit: 2019-05-04 | Discharge: 2019-05-04 | Disposition: A | Discharge status: Home / Self Care | Payer: BLUE CROSS/BLUE SHIELD | Source: Ambulatory Visit | Attending: Obstetrics and Gynecology | Admitting: Obstetrics and Gynecology

## 2019-05-04 ENCOUNTER — Other Ambulatory Visit: Payer: Self-pay

## 2019-05-04 NOTE — Patient Instructions (Signed)
Your procedure is scheduled on: Thursday 05/05/19  Report to the [place] entrance at the time you were instructed by Labor and Delivery.  To find out your arrival time please call [phone]  Remember: Instructions that are not followed completely may result in serious medical risk, up to and including death, or upon the discretion of your surgeon and anesthesiologist your surgery may need to be rescheduled.      _X__ 1. Do not eat food after midnight the night before your procedure.                 No gum chewing or hard candies. You may drink Water up to 2 hours                 before you are scheduled to arrive for your surgery- DO NOT water within 2 hours of the start of your surgery.                **Finish the bottle of G2 2 hours before your arrival time.   __X__2.  On the morning of surgery brush your teeth with toothpaste and water, you may rinse your mouth with mouthwash if you wish.  Do not swallow any toothpaste or mouthwash.      _X__ 3.  No Alcohol for 24 hours before or after surgery.    _X__ 4.  Do Not Smoke or use e-cigarettes For 24 Hours Prior to Your Surgery.                 Do not use any chewable tobacco products for at least 6 hours prior to                 surgery.   __X__5.  Notify your doctor if there is any change in your medical condition      (cold, fever, infections).       Do not wear jewelry, make-up, hairpins, clips or nail polish. Do not wear lotions, powders, or perfumes.  Do not shave 48 hours prior to surgery. Men may shave face and neck. Do not bring valuables to the hospital.     [facility] is not responsible for any belongings or valuables.   Contacts, dentures/partials or body piercings may not be worn into surgery. Bring a case for your contacts, glasses or hearing aids, a denture cup will be supplied.   For patients admitted to the hospital, discharge time is determined by your treatment team.      __X__ Take these  medicines the morning of surgery with A SIP OF WATER:     1. NONE      __X__ Use CHG Soap/SAGE wipes as directed     __X__ Take 1/2 of usual insulin dose the night before surgery. No insulin the morning          of surgery.     __X__  May take Tylenol if needed for pain or discomfort.   You may have ONE dedicated partner accompany you during your hospital stay. They will be screened at the [place] entrance with each return. They are allowed to accompany you in the Operating Room. The dedicated partner may NOT switch out with other visitors.

## 2019-05-05 ENCOUNTER — Inpatient Hospital Stay
Admission: RE | Admit: 2019-05-05 | Payer: BLUE CROSS/BLUE SHIELD | Source: Home / Self Care | Admitting: Obstetrics and Gynecology

## 2019-05-05 ENCOUNTER — Encounter
Admission: RE | Disposition: A | Discharge status: Home / Self Care | Payer: Self-pay | Source: Home / Self Care | Attending: Obstetrics and Gynecology

## 2019-05-05 ENCOUNTER — Other Ambulatory Visit: Payer: Self-pay

## 2019-05-05 ENCOUNTER — Inpatient Hospital Stay: Payer: BLUE CROSS/BLUE SHIELD | Admitting: Anesthesiology

## 2019-05-05 ENCOUNTER — Inpatient Hospital Stay
Admission: RE | Admit: 2019-05-05 | Discharge: 2019-05-07 | DRG: 787 | Disposition: A | Discharge status: Home / Self Care | Payer: BLUE CROSS/BLUE SHIELD | Attending: Obstetrics and Gynecology | Admitting: Obstetrics and Gynecology

## 2019-05-05 DIAGNOSIS — O9081 Anemia of the puerperium: Secondary | ICD-10-CM | POA: Diagnosis not present

## 2019-05-05 DIAGNOSIS — O1092 Unspecified pre-existing hypertension complicating childbirth: Secondary | ICD-10-CM

## 2019-05-05 DIAGNOSIS — Z3A38 38 weeks gestation of pregnancy: Secondary | ICD-10-CM

## 2019-05-05 DIAGNOSIS — O24424 Gestational diabetes mellitus in childbirth, insulin controlled: Secondary | ICD-10-CM | POA: Diagnosis present

## 2019-05-05 DIAGNOSIS — O34219 Maternal care for unspecified type scar from previous cesarean delivery: Secondary | ICD-10-CM

## 2019-05-05 DIAGNOSIS — O321XX0 Maternal care for breech presentation, not applicable or unspecified: Secondary | ICD-10-CM | POA: Diagnosis present

## 2019-05-05 DIAGNOSIS — O1002 Pre-existing essential hypertension complicating childbirth: Secondary | ICD-10-CM | POA: Diagnosis present

## 2019-05-05 DIAGNOSIS — Z7901 Long term (current) use of anticoagulants: Secondary | ICD-10-CM | POA: Diagnosis not present

## 2019-05-05 DIAGNOSIS — D62 Acute posthemorrhagic anemia: Secondary | ICD-10-CM | POA: Diagnosis not present

## 2019-05-05 DIAGNOSIS — O34211 Maternal care for low transverse scar from previous cesarean delivery: Principal | ICD-10-CM | POA: Diagnosis present

## 2019-05-05 LAB — GLUCOSE, CAPILLARY: Glucose-Capillary: 96 mg/dL (ref 70–99)

## 2019-05-05 SURGERY — Surgical Case
Anesthesia: Spinal

## 2019-05-05 MED ORDER — KETOROLAC TROMETHAMINE 30 MG/ML IJ SOLN: 30.0000 mg | Freq: Four times a day (QID) | INTRAMUSCULAR | Status: AC | PRN

## 2019-05-05 MED ORDER — SIMETHICONE 80 MG PO CHEW
80.0000 mg | CHEWABLE_TABLET | ORAL | Status: DC | PRN
  Administered 2019-05-06: 80 mg via ORAL
  Filled 2019-05-05: qty 1

## 2019-05-05 MED ORDER — BUPIVACAINE IN DEXTROSE 0.75-8.25 % IT SOLN
INTRATHECAL | Status: DC | PRN
  Administered 2019-05-05: 1.6 mL via INTRATHECAL

## 2019-05-05 MED ORDER — LIDOCAINE HCL (PF) 1 % IJ SOLN
INTRAMUSCULAR | Status: AC
  Filled 2019-05-05: qty 30

## 2019-05-05 MED ORDER — DIPHENHYDRAMINE HCL 25 MG PO CAPS: 25.0000 mg | ORAL_CAPSULE | ORAL | Status: DC | PRN

## 2019-05-05 MED ORDER — NALBUPHINE HCL 10 MG/ML IJ SOLN: 5.0000 mg | INTRAMUSCULAR | Status: DC | PRN

## 2019-05-05 MED ORDER — NALOXONE HCL 0.4 MG/ML IJ SOLN: 0.4000 mg | INTRAMUSCULAR | Status: DC | PRN

## 2019-05-05 MED ORDER — ACETAMINOPHEN 500 MG PO TABS
1000.0000 mg | ORAL_TABLET | Freq: Four times a day (QID) | ORAL | Status: AC
  Administered 2019-05-05 – 2019-05-06 (×4): 1000 mg via ORAL
  Filled 2019-05-05 (×4): qty 2

## 2019-05-05 MED ORDER — LABETALOL HCL 200 MG PO TABS
200.0000 mg | ORAL_TABLET | Freq: Two times a day (BID) | ORAL | Status: DC
  Administered 2019-05-05 – 2019-05-07 (×4): 200 mg via ORAL
  Filled 2019-05-05 (×4): qty 1

## 2019-05-05 MED ORDER — DIPHENHYDRAMINE HCL 50 MG/ML IJ SOLN: 12.5000 mg | INTRAMUSCULAR | Status: DC | PRN

## 2019-05-05 MED ORDER — OXYTOCIN 40 UNITS IN NORMAL SALINE INFUSION - SIMPLE MED
INTRAVENOUS | Status: DC | PRN
  Administered 2019-05-05: 500 mL via INTRAVENOUS

## 2019-05-05 MED ORDER — SOD CITRATE-CITRIC ACID 500-334 MG/5ML PO SOLN
30.0000 mL | ORAL | Status: AC
  Administered 2019-05-05: 09:00:00 30 mL via ORAL
  Filled 2019-05-05: qty 30

## 2019-05-05 MED ORDER — FENTANYL CITRATE (PF) 100 MCG/2ML IJ SOLN: 25.0000 ug | INTRAMUSCULAR | Status: DC | PRN

## 2019-05-05 MED ORDER — OXYTOCIN 40 UNITS IN NORMAL SALINE INFUSION - SIMPLE MED
2.5000 [IU]/h | INTRAVENOUS | Status: AC
  Filled 2019-05-05 (×2): qty 1000

## 2019-05-05 MED ORDER — WITCH HAZEL-GLYCERIN EX PADS: 1.0000 "application " | MEDICATED_PAD | CUTANEOUS | Status: DC | PRN

## 2019-05-05 MED ORDER — MENTHOL 3 MG MT LOZG
1.0000 | LOZENGE | OROMUCOSAL | Status: DC | PRN
  Filled 2019-05-05: qty 9

## 2019-05-05 MED ORDER — BUPIVACAINE HCL (PF) 0.5 % IJ SOLN
INTRAMUSCULAR | Status: DC | PRN
  Administered 2019-05-05: 10 mL

## 2019-05-05 MED ORDER — OXYCODONE-ACETAMINOPHEN 5-325 MG PO TABS: 1.0000 | ORAL_TABLET | ORAL | Status: DC | PRN

## 2019-05-05 MED ORDER — SODIUM CHLORIDE 0.9% FLUSH: 3.0000 mL | INTRAVENOUS | Status: DC | PRN

## 2019-05-05 MED ORDER — LACTATED RINGERS IV SOLN
Freq: Once | INTRAVENOUS | Status: AC
  Administered 2019-05-05: 999 mL via INTRAVENOUS

## 2019-05-05 MED ORDER — OXYTOCIN 10 UNIT/ML IJ SOLN
INTRAMUSCULAR | Status: AC
  Filled 2019-05-05: qty 2

## 2019-05-05 MED ORDER — DIBUCAINE (PERIANAL) 1 % EX OINT: 1.0000 "application " | TOPICAL_OINTMENT | CUTANEOUS | Status: DC | PRN

## 2019-05-05 MED ORDER — KETOROLAC TROMETHAMINE 30 MG/ML IJ SOLN
30.0000 mg | Freq: Four times a day (QID) | INTRAMUSCULAR | Status: AC | PRN
  Administered 2019-05-05 – 2019-05-06 (×3): 30 mg via INTRAVENOUS
  Filled 2019-05-05 (×3): qty 1

## 2019-05-05 MED ORDER — NALBUPHINE HCL 10 MG/ML IJ SOLN: 5.0000 mg | Freq: Once | INTRAMUSCULAR | Status: DC | PRN

## 2019-05-05 MED ORDER — SIMETHICONE 80 MG PO CHEW
80.0000 mg | CHEWABLE_TABLET | Freq: Three times a day (TID) | ORAL | Status: DC
  Administered 2019-05-05 – 2019-05-07 (×7): 80 mg via ORAL
  Filled 2019-05-05 (×5): qty 1

## 2019-05-05 MED ORDER — ONDANSETRON HCL 4 MG/2ML IJ SOLN
INTRAMUSCULAR | Status: DC | PRN
  Administered 2019-05-05: 4 mg via INTRAVENOUS

## 2019-05-05 MED ORDER — DIPHENHYDRAMINE HCL 25 MG PO CAPS: 25.0000 mg | ORAL_CAPSULE | Freq: Four times a day (QID) | ORAL | Status: DC | PRN

## 2019-05-05 MED ORDER — OXYTOCIN 40 UNITS IN NORMAL SALINE INFUSION - SIMPLE MED
INTRAVENOUS | Status: AC
  Filled 2019-05-05: qty 1000

## 2019-05-05 MED ORDER — SODIUM CHLORIDE 0.9 % IV BOLUS
500.0000 mL | Freq: Once | INTRAVENOUS | Status: AC
  Administered 2019-05-05: 500 mL via INTRAVENOUS

## 2019-05-05 MED ORDER — BUPIVACAINE HCL (PF) 0.5 % IJ SOLN
20.0000 mL | INTRAMUSCULAR | Status: DC
  Filled 2019-05-05: qty 30

## 2019-05-05 MED ORDER — SIMETHICONE 80 MG PO CHEW
80.0000 mg | CHEWABLE_TABLET | ORAL | Status: DC
  Filled 2019-05-05 (×2): qty 1

## 2019-05-05 MED ORDER — LACTATED RINGERS IV SOLN: INTRAVENOUS | Status: DC

## 2019-05-05 MED ORDER — PRENATAL MULTIVITAMIN CH
1.0000 | ORAL_TABLET | Freq: Every day | ORAL | Status: DC
  Administered 2019-05-05 – 2019-05-07 (×3): 1 via ORAL
  Filled 2019-05-05 (×3): qty 1

## 2019-05-05 MED ORDER — PHENYLEPHRINE 40 MCG/ML (10ML) SYRINGE FOR IV PUSH (FOR BLOOD PRESSURE SUPPORT)
PREFILLED_SYRINGE | INTRAVENOUS | Status: DC | PRN
  Administered 2019-05-05: 200 ug via INTRAVENOUS
  Administered 2019-05-05: 100 ug via INTRAVENOUS

## 2019-05-05 MED ORDER — BUPIVACAINE 0.25 % ON-Q PUMP DUAL CATH 400 ML
400.0000 mL | INJECTION | Status: DC
  Filled 2019-05-05: qty 400

## 2019-05-05 MED ORDER — AMMONIA AROMATIC IN INHA
RESPIRATORY_TRACT | Status: AC
  Filled 2019-05-05: qty 10

## 2019-05-05 MED ORDER — NALOXONE HCL 4 MG/10ML IJ SOLN
1.0000 ug/kg/h | INTRAVENOUS | Status: DC | PRN
  Filled 2019-05-05: qty 5

## 2019-05-05 MED ORDER — SENNOSIDES-DOCUSATE SODIUM 8.6-50 MG PO TABS
2.0000 | ORAL_TABLET | ORAL | Status: DC
  Administered 2019-05-06 – 2019-05-07 (×2): 2 via ORAL
  Filled 2019-05-05 (×2): qty 2

## 2019-05-05 MED ORDER — COCONUT OIL OIL: 1.0000 "application " | TOPICAL_OIL | Status: DC | PRN

## 2019-05-05 MED ORDER — ZOLPIDEM TARTRATE 5 MG PO TABS: 5.0000 mg | ORAL_TABLET | Freq: Every evening | ORAL | Status: DC | PRN

## 2019-05-05 MED ORDER — IBUPROFEN 800 MG PO TABS
800.0000 mg | ORAL_TABLET | Freq: Three times a day (TID) | ORAL | Status: DC
  Administered 2019-05-06 – 2019-05-07 (×5): 800 mg via ORAL
  Filled 2019-05-05 (×6): qty 1

## 2019-05-05 MED ORDER — CEFAZOLIN SODIUM-DEXTROSE 2-4 GM/100ML-% IV SOLN
2.0000 g | INTRAVENOUS | Status: AC
  Administered 2019-05-05: 2 g via INTRAVENOUS
  Filled 2019-05-05: qty 100

## 2019-05-05 MED ORDER — ENOXAPARIN SODIUM 40 MG/0.4ML ~~LOC~~ SOLN
40.0000 mg | Freq: Two times a day (BID) | SUBCUTANEOUS | Status: DC
  Administered 2019-05-06 – 2019-05-07 (×3): 40 mg via SUBCUTANEOUS
  Filled 2019-05-05 (×3): qty 0.4

## 2019-05-05 MED ORDER — MORPHINE SULFATE (PF) 0.5 MG/ML IJ SOLN
INTRAMUSCULAR | Status: DC | PRN
  Administered 2019-05-05: .1 mg via EPIDURAL

## 2019-05-05 MED ORDER — LACTATED RINGERS IV SOLN
INTRAVENOUS | Status: DC
  Administered 2019-05-05 (×2): via INTRAVENOUS

## 2019-05-05 MED ORDER — ONDANSETRON HCL 4 MG/2ML IJ SOLN: 4.0000 mg | Freq: Once | INTRAMUSCULAR | Status: DC | PRN

## 2019-05-05 MED ORDER — FENTANYL CITRATE (PF) 100 MCG/2ML IJ SOLN
INTRAMUSCULAR | Status: DC | PRN
  Administered 2019-05-05: 15 ug via INTRAVENOUS
  Administered 2019-05-05: 35 ug via INTRAVENOUS
  Administered 2019-05-05: 50 ug via INTRAVENOUS

## 2019-05-05 MED ORDER — SODIUM CHLORIDE 0.9 % IV SOLN
INTRAVENOUS | Status: DC | PRN
  Administered 2019-05-05: 50 ug/min via INTRAVENOUS

## 2019-05-05 SURGICAL SUPPLY — 32 items
BAG COUNTER SPONGE EZ (MISCELLANEOUS) ×2 IMPLANT
CANISTER SUCT 3000ML PPV (MISCELLANEOUS) ×3 IMPLANT
CATH KIT ON-Q SILVERSOAK 5 (CATHETERS) ×2 IMPLANT
CATH KIT ON-Q SILVERSOAK 5IN (CATHETERS) ×6 IMPLANT
CHLORAPREP W/TINT 26 (MISCELLANEOUS) ×6 IMPLANT
CLOSURE WOUND 1/2 X4 (GAUZE/BANDAGES/DRESSINGS) ×1
COUNTER SPONGE BAG EZ (MISCELLANEOUS) ×1
DERMABOND ADVANCED (GAUZE/BANDAGES/DRESSINGS) ×2
DERMABOND ADVANCED .7 DNX12 (GAUZE/BANDAGES/DRESSINGS) ×1 IMPLANT
DRSG OPSITE POSTOP 4X10 (GAUZE/BANDAGES/DRESSINGS) ×3 IMPLANT
DRSG TELFA 3X8 NADH (GAUZE/BANDAGES/DRESSINGS) ×3 IMPLANT
ELECT CAUTERY BLADE 6.4 (BLADE) ×3 IMPLANT
ELECT REM PT RETURN 9FT ADLT (ELECTROSURGICAL) ×3
ELECTRODE REM PT RTRN 9FT ADLT (ELECTROSURGICAL) ×1 IMPLANT
GAUZE SPONGE 4X4 12PLY STRL (GAUZE/BANDAGES/DRESSINGS) ×3 IMPLANT
GLOVE BIO SURGEON STRL SZ7 (GLOVE) ×3 IMPLANT
GLOVE INDICATOR 7.5 STRL GRN (GLOVE) ×3 IMPLANT
GOWN STRL REUS W/ TWL LRG LVL3 (GOWN DISPOSABLE) ×3 IMPLANT
GOWN STRL REUS W/TWL LRG LVL3 (GOWN DISPOSABLE) ×6
NS IRRIG 1000ML POUR BTL (IV SOLUTION) ×3 IMPLANT
PACK C SECTION AR (MISCELLANEOUS) ×3 IMPLANT
PAD DRESSING TELFA 3X8 NADH (GAUZE/BANDAGES/DRESSINGS) ×1 IMPLANT
PAD OB MATERNITY 4.3X12.25 (PERSONAL CARE ITEMS) ×3 IMPLANT
PAD PREP 24X41 OB/GYN DISP (PERSONAL CARE ITEMS) ×3 IMPLANT
PENCIL SMOKE ULTRAEVAC 22 CON (MISCELLANEOUS) ×3 IMPLANT
STRIP CLOSURE SKIN 1/2X4 (GAUZE/BANDAGES/DRESSINGS) ×2 IMPLANT
SUT MNCRL AB 4-0 PS2 18 (SUTURE) ×5 IMPLANT
SUT PDS AB 1 TP1 96 (SUTURE) ×6 IMPLANT
SUT PLAIN 2 0 XLH (SUTURE) ×2 IMPLANT
SUT VIC AB 0 CTX 36 (SUTURE) ×4
SUT VIC AB 0 CTX36XBRD ANBCTRL (SUTURE) ×2 IMPLANT
SUT VIC AB 2-0 CT1 36 (SUTURE) ×3 IMPLANT

## 2019-05-05 NOTE — Progress Notes (Signed)
Anticoagulation monitoring(Lovenox):  38 yo female ordered Lovenox 40 mg Q24h  Filed Weights   05/05/19 1505  Weight: 287 lb 0.6 oz (130.2 kg)   BMI 45  Lab Results  Component Value Date   CREATININE 0.47 05/03/2019   CREATININE 0.57 04/29/2019   CREATININE 0.53 04/15/2019   Estimated Creatinine Clearance: 134 mL/min (by C-G formula based on SCr of 0.47 mg/dL). Hemoglobin & Hematocrit     Component Value Date/Time   HGB 11.2 (L) 05/03/2019 1040   HGB 11.8 04/29/2019 1611   HCT 34.0 (L) 05/03/2019 1040   HCT 33.8 (L) 04/29/2019 1611     Per Protocol for Patient with estCrcl > 30 ml/min and BMI > 40, will transition to Lovenox 40 mg Q12h.

## 2019-05-05 NOTE — Discharge Summary (Signed)
Obstetric Discharge Summary Reason for Admission: cesarean section Prenatal Procedures: none Intrapartum Procedures: cesarean: low cervical, transverse Postpartum Procedures: RhoGAM Complications-Operative and Postpartum: none Hemoglobin  Date Value Ref Range Status  05/06/2019 9.9 (L) 12.0 - 15.0 g/dL Final  04/29/2019 11.8 11.1 - 15.9 g/dL Final   HCT  Date Value Ref Range Status  05/06/2019 30.2 (L) 36.0 - 46.0 % Final   Hematocrit  Date Value Ref Range Status  04/29/2019 33.8 (L) 34.0 - 46.6 % Final    Physical Exam:  General: alert, cooperative and no distress Lochia: appropriate Uterine Fundus: firm Incision: Dressing C/D/I DVT Evaluation: No evidence of DVT seen on physical exam.  Discharge Diagnoses: Term Pregnancy-delivered and chronic hypertension  Discharge Information: Date: 05/07/2019 Activity: pelvic rest Diet: routine Allergies as of 05/07/2019   No Known Allergies     Medication List    STOP taking these medications   Accu-Chek FastClix Lancets Misc   Accu-Chek Guide test strip Generic drug: glucose blood   Accu-Chek Guide w/Device Kit   insulin aspart 100 UNIT/ML FlexPen Commonly known as: NOVOLOG   Insulin Glargine 100 UNIT/ML Solostar Pen Commonly known as: LANTUS   NovoFine 32G X 6 MM Misc Generic drug: Insulin Pen Needle     TAKE these medications   escitalopram 10 MG tablet Commonly known as: Lexapro Take 1 tablet (10 mg total) by mouth daily.   labetalol 200 MG tablet Commonly known as: NORMODYNE Take 1 tablet (200 mg total) by mouth 2 (two) times daily.   oxyCODONE-acetaminophen 5-325 MG tablet Commonly known as: PERCOCET/ROXICET Take 1 tablet by mouth every 6 (six) hours as needed.   PRENATAL VITAMINS PO Take 1 tablet by mouth at bedtime.            Discharge Care Instructions  (From admission, onward)         Start     Ordered   05/07/19 0000  Discharge wound care:    Comments: You may apply a light dressing  for minor discharge from the incision or to keep waistbands of clothing from rubbing.  You may also have been discharge with a clear dressing in which case this will be removed at your postoperative clinic visit.  You may shower, use soap on your incision.  Avoid baths or soaking the incision in the first 6 weeks following your surgery.Marland Kitchen   05/07/19 1026          Condition: stable Discharge to: home Follow-up Information    Malachy Mood, MD In 1 week.   Specialty: Obstetrics and Gynecology Why: For wound re-check Contact information: [address] [place] [address] [phone]           Newborn Data: Live born female  Birth Weight: 7 lb 11.1 oz (3490 g) APGAR: 7, 8  Newborn Delivery   Birth date/time: 05/05/2019 10:25:00 Delivery type: C-Section, Low Transverse C-section categorization: Repeat     Information for the patient's newborn:  Verginia, Toohey Girl Ariona [[id number]]  O POS    Home with mother.  Rexene Agent 05/07/2019, 10:46 AM

## 2019-05-05 NOTE — Anesthesia Preprocedure Evaluation (Signed)
Anesthesia Evaluation  Patient identified by MRN, date of birth, ID band Patient awake    Reviewed: Allergy & Precautions, NPO status , Patient's Chart, lab work & pertinent test results  History of Anesthesia Complications Negative for: history of anesthetic complications  Airway Mallampati: III       Dental   Pulmonary neg sleep apnea, neg COPD, Not current smoker, former smoker,           Cardiovascular hypertension, Pt. on medications (-) Past MI and (-) CHF (-) dysrhythmias (-) Valvular Problems/Murmurs     Neuro/Psych neg Seizures Anxiety Depression    GI/Hepatic Neg liver ROS, neg GERD  ,  Endo/Other  diabetes, Gestational, Insulin Dependent  Renal/GU negative Renal ROS     Musculoskeletal   Abdominal   Peds  Hematology   Anesthesia Other Findings   Reproductive/Obstetrics                             Anesthesia Physical Anesthesia Plan  ASA: III  Anesthesia Plan: Spinal   Post-op Pain Management:    Induction:   PONV Risk Score and Plan:   Airway Management Planned: Nasal Cannula  Additional Equipment:   Intra-op Plan:   Post-operative Plan:   Informed Consent: I have reviewed the patients History and Physical, chart, labs and discussed the procedure including the risks, benefits and alternatives for the proposed anesthesia with the patient or authorized representative who has indicated his/her understanding and acceptance.       Plan Discussed with:   Anesthesia Plan Comments:         Anesthesia Quick Evaluation

## 2019-05-05 NOTE — Op Note (Signed)
Preoperative Diagnosis: 1) 38 y.o. [id number] at 8873w0d 2) History of prior cesarean section 3) Insulin controlled gestational diabetes 4) Chronic hypertension with worsening blood pressures in the third trimester  Postoperative Diagnosis: 1) 38 y.o. [id number] at 4073w0d 2) History of prior cesarean section 3) Insulin controlled gestational diabetes 4) Chronic hypertension with worsening blood pressures in the third trimester  Operation Performed: Repeat low transverse C-section via pfannenstiel skin incision  Indiciation: History of prior C-section  Anesthesia: .Spinal  Primary Surgeon: Vena AustriaAndreas Kaliann Coryell, MD   Assistant:Paul Tiburcio PeaHarris, MD this surgery required a high level surgical assistant with none other readily available  Preoperative Antibiotics: 2g ancef  Estimated Blood Loss: 700 mL  IV Fluids: 1300mL  Urine Output:: 100mL  Drains or Tubes: Foley to gravity drainage, ON-Q catheter system  Implants: none  Specimens Removed: none  Complications: none  Intraoperative Findings:  Normal tubes ovaries and uterus.  Delivery resulted in the birth of a liveborn female, APGAR (1 MIN): 7   APGAR (5 MINS): 8, weight 7lbs 13oz  Patient Condition:stable  Procedure in Detail:  Patient was taken to the operating room were she was administered regional anesthesia.  She was positioned in the supine position, prepped and draped in the  Usual sterile fashion.  Prior to proceeding with the case a time out was performed and the level of anesthetic was checked and noted to be adequate.  Utilizing the scalpel a pfannenstiel skin incision was made 2cm above the pubic symphysis utilizing the patient's pre-existing scar and carried down sharply to the the level of the rectus fascia.  The fascia was incised in the midline using the scalpel and then extended using mayo scissors.  The superior border of the rectus fascia was grasped with two Kocher clamps and the underlying rectus muscles were dissected of  the fascia using blunt dissection.  The median raphae was incised using Mayo scissors.   The inferior border of the rectus fascia was dissected of the rectus muscles in a similar fashion.  The midline was identified, the peritoneum was entered bluntly and expanded using manual tractions.  The uterus was noted to be in a none rotated position.  Next the bladder blade was placed retracting the bladder caudally.  A bladder flap was not created.  A low transverse incision was scored on the lower uterine segment.  The hysterotomy was entered bluntly using the operators finger.  The hysterotomy incision was extended using manual traction.  The operators hand was placed within the hysterotomy position noting the fetus to be within the frank breech position.  The fett were grasped, brought to the incision, and body delivered to the level of the scapula.  The right arm was splinted, delivered across the babies chest.  The infant was rotated 180 degrees and the left arm was delivered in similar fashion.  The head was flexed and delivered with fundal pressure.. The infant was suctioned, cord was clamped and cut before handing off to the awaiting neonatologist.  The placenta was delivered using manual extraction.  The uterus was exteriorized, wiped clean of clots and debris using two moist laps.  The hysterotomy was closed using a two layer closure of 0 Vicryl, with the first being a running locked, the second a vertical imbricating.  The uterus was returned to the abdomen.  The peritoneal gutters were wiped clean of clots and debris using two moist laps.  The hysterotomy incision was re-inspected noted to be hemostatic. The rectus muscles were inspected noted to be  hemostatic.  The superior border of the rectus fascia was grasped with a Kocher clamp.  The ON-Q trocars were then placed 4cm above the superior border of the incision and tunneled subfascially.  The introducers were removed and the catheters were threaded through  the sleeves after which the sleeves were removed.  The fascia was closed using a looped #1 PDS in a running fashion taking 1cm by 1cm bites.  The subcutaneous tissue was irrigated using warm saline, hemostasis achieved using the bovie.  The subcutaneous dead space was greater than 3cm and was closed.  The subcutaneous dead space was obliterated by using a 53-T 0 Chromic in a running fashion.  The skin was closed using 4-0 Monocryl in a subcuticular fashion.  Sponge needle and instrument counts were corrects times two.  The patient tolerated the procedure well and was taken to the recovery room in stable condition.

## 2019-05-05 NOTE — Transfer of Care (Signed)
Immediate Anesthesia Transfer of Care Note  Patient: Tiffany Chapman  Procedure(s) Performed: CESAREAN SECTION (N/A )  Patient Location: PACU and Mother/Baby  Anesthesia Type:Spinal  Level of Consciousness: awake and patient cooperative  Airway & Oxygen Therapy: Patient Spontanous Breathing  Post-op Assessment: Report given to RN and Post -op Vital signs reviewed and stable  Post vital signs: stable  Last Vitals:  Vitals Value Taken Time  BP 125/70 05/05/19 1120  Temp 36.6 C 05/05/19 1120  Pulse 84 05/05/19 1120  Resp 13 05/05/19 1120  SpO2 100 % 05/05/19 1116    Last Pain:  Vitals:   05/05/19 0850  TempSrc: Oral         Complications: No apparent anesthesia complications

## 2019-05-05 NOTE — Anesthesia Post-op Follow-up Note (Signed)
Anesthesia QCDR form completed.        

## 2019-05-05 NOTE — H&P (Signed)
Date of Initial H&P: 05/05/2019  History reviewed, patient examined, no change in status, stable for surgery.

## 2019-05-05 NOTE — Progress Notes (Signed)
Pt to OR 2

## 2019-05-05 NOTE — Anesthesia Procedure Notes (Signed)
Spinal  Patient location during procedure: OR Start time: 05/05/2019 10:00 AM End time: 05/05/2019 10:06 AM Staffing Resident/CRNA: [name], CRNA Performed: resident/CRNA  Preanesthetic Checklist Completed: patient identified, site marked, surgical consent, pre-op evaluation, timeout performed, IV checked, risks and benefits discussed and monitors and equipment checked Spinal Block Patient position: sitting Prep: DuraPrep Patient monitoring: heart rate, cardiac monitor, continuous pulse ox and blood pressure Approach: midline Location: L3-4 Injection technique: single-shot Needle Needle type: Sprotte and Whitacre  Needle gauge: 25 G Needle length: 9 cm Assessment Sensory level: T6

## 2019-05-06 ENCOUNTER — Inpatient Hospital Stay: Admission: RE | Admit: 2019-05-06 | Payer: BLUE CROSS/BLUE SHIELD | Source: Ambulatory Visit

## 2019-05-06 ENCOUNTER — Other Ambulatory Visit: Payer: BLUE CROSS/BLUE SHIELD

## 2019-05-06 ENCOUNTER — Encounter: Payer: BLUE CROSS/BLUE SHIELD | Admitting: Obstetrics and Gynecology

## 2019-05-06 ENCOUNTER — Encounter: Payer: Self-pay | Admitting: Obstetrics and Gynecology

## 2019-05-06 LAB — COMPREHENSIVE METABOLIC PANEL
ALT: 17 U/L (ref 0–44)
AST: 23 U/L (ref 15–41)
Albumin: 2.5 g/dL — ABNORMAL LOW (ref 3.5–5.0)
Alkaline Phosphatase: 129 U/L — ABNORMAL HIGH (ref 38–126)
Anion gap: 7 (ref 5–15)
BUN: 11 mg/dL (ref 6–20)
CO2: 20 mmol/L — ABNORMAL LOW (ref 22–32)
Calcium: 8.6 mg/dL — ABNORMAL LOW (ref 8.9–10.3)
Chloride: 106 mmol/L (ref 98–111)
Creatinine, Ser: 0.54 mg/dL (ref 0.44–1.00)
GFR calc Af Amer: >60 mL/min (ref >60)
GFR calc non Af Amer: >60 mL/min (ref >60)
Glucose, Bld: 110 mg/dL — ABNORMAL HIGH (ref 70–99)
Potassium: 4.3 mmol/L (ref 3.5–5.1)
Sodium: 133 mmol/L — ABNORMAL LOW (ref 135–145)
Total Bilirubin: 0.6 mg/dL (ref 0.3–1.2)
Total Protein: 5.6 g/dL — ABNORMAL LOW (ref 6.5–8.1)

## 2019-05-06 LAB — TYPE AND SCREEN
ABO/RH(D): O NEG
Antibody Screen: POSITIVE
Unit division: 0
Unit division: 0

## 2019-05-06 LAB — CBC
HCT: 30.2 % — ABNORMAL LOW (ref 36.0–46.0)
Hemoglobin: 9.9 g/dL — ABNORMAL LOW (ref 12.0–15.0)
MCH: 31.6 pg (ref 26.0–34.0)
MCHC: 32.8 g/dL (ref 30.0–36.0)
MCV: 96.5 fL (ref 80.0–100.0)
Platelets: 187 10*3/uL (ref 150–400)
RBC: 3.13 MIL/uL — ABNORMAL LOW (ref 3.87–5.11)
RDW: 13 % (ref 11.5–15.5)
WBC: 8.9 10*3/uL (ref 4.0–10.5)
nRBC: 0 % (ref 0.0–0.2)

## 2019-05-06 LAB — BPAM RBC
Blood Product Expiration Date: 202009242359
Blood Product Expiration Date: 202009262359
Unit Type and Rh: 9500
Unit Type and Rh: 9500

## 2019-05-06 LAB — PROTEIN / CREATININE RATIO, URINE
Creatinine, Urine: 22 mg/dL
Total Protein, Urine: <6 mg/dL

## 2019-05-06 LAB — FETAL SCREEN: Fetal Screen: NEGATIVE

## 2019-05-06 MED ORDER — RHO D IMMUNE GLOBULIN 1500 UNIT/2ML IJ SOSY
300.0000 ug | PREFILLED_SYRINGE | Freq: Once | INTRAMUSCULAR | Status: AC
  Administered 2019-05-06: 17:00:00 300 ug via INTRAVENOUS
  Filled 2019-05-06: qty 2

## 2019-05-06 MED ORDER — LABETALOL HCL 5 MG/ML IV SOLN: 20.0000 mg | INTRAVENOUS | Status: DC | PRN

## 2019-05-06 MED ORDER — LABETALOL HCL 5 MG/ML IV SOLN
10.0000 mg | Freq: Once | INTRAVENOUS | Status: AC
  Administered 2019-05-06: 10 mg via INTRAVENOUS

## 2019-05-06 MED ORDER — LABETALOL HCL 5 MG/ML IV SOLN: 40.0000 mg | INTRAVENOUS | Status: DC | PRN

## 2019-05-06 MED ORDER — LABETALOL HCL 5 MG/ML IV SOLN: 80.0000 mg | INTRAVENOUS | Status: DC | PRN

## 2019-05-06 MED ORDER — HYDRALAZINE HCL 20 MG/ML IJ SOLN: 10.0000 mg | INTRAMUSCULAR | Status: DC | PRN

## 2019-05-06 NOTE — Anesthesia Post-op Follow-up Note (Signed)
  Anesthesia Pain Follow-up Note  Patient: Tiffany Chapman  Day #: 1  Date of Follow-up: 05/06/2019 Time: 7:27 AM  Last Vitals:  Vitals:   05/06/19 0510 05/06/19 0515  BP: 135/86 140/89  Pulse: 81 69  Resp:    Temp:    SpO2: 97% 98%    Level of Consciousness: alert  Pain: none   Side Effects:None  Catheter Site Exam:clean, dry, no drainage  Anti-Coag Meds (From admission, onward)   Start     Dose/Rate Route Frequency Ordered Stop   05/06/19 0800  enoxaparin (LOVENOX) injection 40 mg     40 mg Subcutaneous Every 12 hours 05/05/19 1423         Plan: D/C from anesthesia care at surgeon's request  Saranne Crislip Lorenza Chick

## 2019-05-06 NOTE — Discharge Instructions (Signed)
Discharge Instructions:   Follow-up Appointment:   If there are any new medications, they have been ordered and will be available for pickup at the listed pharmacy on your way home from the hospital.   Call office if you have any of the following: headache, visual changes, fever >101.0 F, chills, shortness of breath, breast concerns, excessive vaginal bleeding, incision drainage or problems, leg pain or redness, depression or any other concerns. If you have vaginal discharge with an odor, let your doctor know.   It is normal to bleed for up to 6 weeks. You should not soak through more than 1 pad in 1 hour. If you have a blood clot larger than your fist with continued bleeding, call your doctor.   After a c-section, you should expect a small amount of blood or clear fluid coming from the incision and abdominal cramping/soreness. Inspect your incision site daily. Stand in front of a mirror to look for any redness, incision opening, or discolored/odorness drainage. Take a shower daily and continue good hygiene. Use own towel and washcloth (do not share). Make sure your sheets on your bed are clean. No pets sleeping around your incision site. Dressing will be removed at your postpartum visit. If the dressing does become wet or soiled underneath, it is okay to remove it.   On-Q pump: You will remove on day 5 after insertion or if the ball becomes flat before day 5. You will remove on: Tuesday, September 1st  Activity: Do not lift > 10 lbs for 6 weeks (do not lift anything heavier than your baby). No intercourse, tampons, swimming pools, hot tubs, baths (only showers) for 6 weeks.  No driving for 1-2 weeks. Continue prenatal vitamin, especially if breastfeeding. Increase calories and fluids (water) while breastfeeding.   Your milk will come in, in the next couple of days (right now it is colostrum). You may have a slight fever when your milk comes in, but it should go away on its own.  If it does not,  and rises above 101 F please call the doctor. You will also feel achy and your breasts will be firm. They will also start to leak. If you are breastfeeding, continue as you have been and you can pump/express milk for comfort.   If you have too much milk, your breasts can become engorged, which could lead to mastitis. This is an infection of the milk ducts. It can be very painful and you will need to notify your doctor to obtain a prescription for antibiotics. You can also treat it with a shower or hot/cold compress.   For concerns about your baby, please call your pediatrician.  For breastfeeding concerns, the lactation consultant can be reached at [phone].   Postpartum blues (feelings of happy one minute and sad another minute) are normal for the first few weeks but if it gets worse let your doctor know.   Congratulations! We enjoyed caring for you and your new bundle of joy!

## 2019-05-06 NOTE — Progress Notes (Signed)
BP at 0345 was 153/103 and 161/89 with a recheck in the other arm. Given 200mg  labetalol at 2139.   Continued to monitor pressures following latetalol and still not improved. 161/89 out of parameters. Paged Dr. Kenton Kingfisher at [phone].   Will continue to monitor.

## 2019-05-06 NOTE — Anesthesia Postprocedure Evaluation (Signed)
Anesthesia Post Note  Patient: Tiffany Chapman  Procedure(s) Performed: CESAREAN SECTION (N/A )  Patient location during evaluation: Mother Baby Anesthesia Type: Spinal Level of consciousness: oriented and awake and alert Pain management: pain level controlled Vital Signs Assessment: post-procedure vital signs reviewed and stable Respiratory status: spontaneous breathing and respiratory function stable Cardiovascular status: blood pressure returned to baseline and stable Postop Assessment: no headache, no backache, no apparent nausea or vomiting and able to ambulate Anesthetic complications: no     Last Vitals:  Vitals:   05/06/19 0510 05/06/19 0515  BP: 135/86 140/89  Pulse: 81 69  Resp:    Temp:    SpO2: 97% 98%    Last Pain:  Vitals:   05/06/19 0445  TempSrc:   PainSc: 0-No pain                 Maple Odaniel Lorenza Chick

## 2019-05-06 NOTE — Progress Notes (Signed)
Obstetric Postpartum/PostOperative Daily Progress Note Subjective:  38 y.o. [id number] post-operative day # 1 status post repeat cesarean delivery.  She is ambulating, is tolerating po, is voiding spontaneously.  Her pain is well controlled on PO pain medications. Her lochia is less than menses.   Medications SCHEDULED MEDICATIONS  . enoxaparin (LOVENOX) injection  40 mg Subcutaneous Q12H  . ibuprofen  800 mg Oral Q8H  . labetalol  200 mg Oral BID  . prenatal multivitamin  1 tablet Oral Q1200  . senna-docusate  2 tablet Oral Q24H  . simethicone  80 mg Oral TID PC  . simethicone  80 mg Oral Q24H    MEDICATION INFUSIONS  . bupivacaine 0.25 % ON-Q pump DUAL CATH 400 mL    . lactated ringers    . lactated ringers    . naLOXone (NARCAN) adult infusion for PRURITIS      PRN MEDICATIONS  coconut oil, witch hazel-glycerin **AND** dibucaine, diphenhydrAMINE **OR** diphenhydrAMINE, labetalol **AND** labetalol **AND** labetalol **AND** hydrALAZINE **AND** [COMPLETED] Measure blood pressure, ketorolac **OR** ketorolac, menthol-cetylpyridinium, nalbuphine **OR** nalbuphine, nalbuphine **OR** nalbuphine, naloxone **AND** sodium chloride flush, naLOXone (NARCAN) adult infusion for PRURITIS, oxyCODONE-acetaminophen, simethicone, zolpidem    Objective:   Vitals:   05/06/19 0510 05/06/19 0515 05/06/19 0733 05/06/19 0747  BP: 135/86 140/89 (!) 160/86 136/82  Pulse: 81 69 93 81  Resp:   18   Temp:   98.2 F (36.8 C)   TempSrc:   Oral   SpO2: 97% 98% 100%   Weight:        Current Vital Signs 24h Vital Sign Ranges  T 98.2 F (36.8 C) Temp  Avg: 98.6 F (37 C)  Min: 97.9 F (36.6 C)  Max: 99.4 F (37.4 C)  BP 136/82(rechecked ) BP  Min: 125/70  Max: 163/102  HR 81 Pulse  Avg: 85.4  Min: 69  Max: 97  RR 18 Resp  Avg: 16.8  Min: 13  Max: 19  SaO2 100 % Room Air SpO2  Avg: 98.2 %  Min: 97 %  Max: 100 %       24 Hour I/O Current Shift I/O  Time Ins Outs 08/27 0701 - 08/28 0700 In: 4620  [P.O.:1120; I.V.:3500] Out: 4000 [Urine:2600] 08/28 0701 - 08/28 1900 In: 240 [P.O.:240] Out: -   General: NAD Pulmonary: no increased work of breathing Abdomen: non-distended, non-tender, fundus firm at level of umbilicus Inc: Clean/dry/intact Extremities: no edema, no erythema, no tenderness  Labs:  Recent Labs  Lab 04/29/19 1611 05/03/19 1040 05/06/19 0458  WBC 11.0* 10.1 8.9  HGB 11.8 11.2* 9.9*  HCT 33.8* 34.0* 30.2*  PLT 233 216 187     Assessment:   38 y.o. P5T6144 postoperative day # 1 status post repeat cesarean section  Plan:  1) Acute blood loss anemia - hemodynamically stable and asymptomatic - po ferrous sulfate  2) O NEG / Rubella 12.10 (01/27 1553)/ Varicella Immune  3) TDAP status: most recent vaccination 2 years ago. Patient declines at this time.  4) Formula feeding/Contraception = IUD   5) Chronic hypertension: Labetalol 200 mg PO BID, IV Labetalol protocol for severe range blood pressures  6) Anti-coagulation: Lovenox 40 mg [place] Q 12 hours  7) Disposition: Continue routine post c/section care   Rod Can, CNM 05/06/2019 10:38 AM

## 2019-05-06 NOTE — Progress Notes (Signed)
At 0456, prior to giving 10mg  IV labetalol BP was 156/91.  10mg  IV labetalol given at 0457.   BP checks:  0504:  155/91 0510: 135/86 0515: 140/89  Will continue to monitor.

## 2019-05-07 LAB — RHOGAM INJECTION: Unit division: 0

## 2019-05-07 LAB — RPR: RPR Ser Ql: NONREACTIVE

## 2019-05-07 MED ORDER — LABETALOL HCL 200 MG PO TABS: 200.0000 mg | ORAL_TABLET | Freq: Two times a day (BID) | ORAL | 0 refills | Status: DC

## 2019-05-07 MED ORDER — OXYCODONE-ACETAMINOPHEN 5-325 MG PO TABS: 1.0000 | ORAL_TABLET | Freq: Four times a day (QID) | ORAL | 0 refills | Status: DC | PRN

## 2019-05-07 NOTE — Progress Notes (Signed)
Discharge instructions provided.  Incision hygiene and care, and removal of On Q pump instructed.  Pt and sig other verbalize understanding of all instructions and follow-up care.  Incision hygiene kit given.  Pt discharged to home with infant at 1745 on 05/07/19 via wheelchair by RN. Reed Breech, RN 05/07/2019 7:32 PM

## 2019-05-09 ENCOUNTER — Other Ambulatory Visit: Payer: BLUE CROSS/BLUE SHIELD

## 2019-05-10 ENCOUNTER — Other Ambulatory Visit: Payer: BLUE CROSS/BLUE SHIELD

## 2019-05-10 ENCOUNTER — Encounter: Payer: BLUE CROSS/BLUE SHIELD | Admitting: Obstetrics and Gynecology

## 2019-05-11 ENCOUNTER — Ambulatory Visit: Payer: BLUE CROSS/BLUE SHIELD

## 2019-05-11 ENCOUNTER — Encounter: Payer: Self-pay | Admitting: Obstetrics and Gynecology

## 2019-05-11 ENCOUNTER — Ambulatory Visit (INDEPENDENT_AMBULATORY_CARE_PROVIDER_SITE_OTHER): Payer: BLUE CROSS/BLUE SHIELD | Admitting: Obstetrics and Gynecology

## 2019-05-11 ENCOUNTER — Other Ambulatory Visit: Payer: Self-pay

## 2019-05-11 VITALS — BP 180/100 | HR 80 | Wt 268.0 lb

## 2019-05-11 VITALS — BP 150/90

## 2019-05-11 DIAGNOSIS — R030 Elevated blood-pressure reading, without diagnosis of hypertension: Secondary | ICD-10-CM

## 2019-05-11 DIAGNOSIS — Z4889 Encounter for other specified surgical aftercare: Secondary | ICD-10-CM

## 2019-05-11 DIAGNOSIS — Z0130 Encounter for examination of blood pressure without abnormal findings: Secondary | ICD-10-CM

## 2019-05-11 MED ORDER — NYSTATIN 100000 UNIT/GM EX CREA: 1.0000 "application " | TOPICAL_CREAM | Freq: Two times a day (BID) | CUTANEOUS | 1 refills | Status: DC

## 2019-05-11 MED ORDER — LABETALOL HCL 200 MG PO TABS: 200.0000 mg | ORAL_TABLET | Freq: Two times a day (BID) | ORAL | 3 refills | Status: DC

## 2019-05-11 NOTE — Progress Notes (Signed)
Postoperative Follow-up Patient presents post op from RLTCS 1weeks ago for history of prior C-section, worsening CHTN, GDM on insulin.  Subjective: Patient reports marked improvement in her preop symptoms. Eating a regular diet without difficulty. Pain is controlled without any medications.  Activity: normal activities of daily living.No headaches or vision changes, no RUQ or epigastric pain  Objective: Blood pressure (!) 180/100, pulse 80, weight 268 lb (121.6 kg), unknown if currently breastfeeding.  General: NAD Pulmonary: no increased work of breathing Abdomen: soft, non-tender, non-distended, incision D/C/I some changes consistent with tiniea corporis  Extremities: no edema Neurologic: normal gait    Admission on 05/05/2019, Discharged on 05/07/2019  Component Date Value Ref Range Status  . RPR Ser Ql 05/05/2019 NON REACTIVE  NON REACTIVE Final   Performed at [facility], [address] [place], [place] [address]  . ABO/RH(D) 05/05/2019 O NEG   Final  . Antibody Screen 05/05/2019 POS   Final  . Sample Expiration 05/05/2019 05/08/2019,2359   Final  . Antibody Identification 05/05/2019 PASSIVELY ACQUIRED ANTI-D   Final  . Unit Number 05/05/2019 [id number]   Final  . Blood Component Type 05/05/2019 RBC LR PHER2   Final  . Unit division 05/05/2019 00   Final  . Status of Unit 05/05/2019 [facility]   Final  . Transfusion Status 05/05/2019 OK TO TRANSFUSE   Final  . Crossmatch Result 05/05/2019 COMPATIBLE   Final  . Unit Number 05/05/2019 [id number]   Final  . Blood Component Type 05/05/2019 RED CELLS,LR   Final  . Unit division 05/05/2019 00   Final  . Status of [facility]05/2019 [facility]   Final  . Transfusion Status 05/05/2019 OK TO TRANSFUSE   Final  . Crossmatch Result 05/05/2019 COMPATIBLE   Final  . Glucose-Capillary 05/05/2019 96  70 - 99 mg/dL Final  . Blood Product Unit Number 05/05/2019 [id number]   Final  . PRODUCT CODE 05/05/2019  [id number]   Final  . Unit Type and Rh 05/05/2019 9500   Final  . Blood Product Expiration Date 05/05/2019 [id number]   Final  . Blood Product Unit Number 05/05/2019 [id number]   Final  . PRODUCT CODE 05/05/2019 [id number]   Final  . Unit Type and Rh 05/05/2019 9500   Final  . Blood Product Expiration Date 05/05/2019 [id number]   Final  . Fetal Screen 05/06/2019    Final                   Value:NEG Performed at [facility], [address] [place], [place] [address]   . Unit Number 05/06/2019 [id number]   Final  . Blood Component Type 05/06/2019 RHIG   Final  . Unit division 05/06/2019 00   Final  . Status of Unit 05/06/2019 ISSUED,FINAL   Final  . Transfusion Status 05/06/2019    Final                   Value:OK TO TRANSFUSE Performed at [facility], [address] [place], [place] [address]   . Creatinine, Urine 05/06/2019 22  mg/dL Final  . Total Protein, Urine 05/06/2019 <6  mg/dL Final   NO NORMAL RANGE ESTABLISHED FOR THIS TEST  . Protein Creatinine Ratio 05/06/2019         0.00 - 0.15 mg/mg[Cre] Final   Comment: RESULT BELOW REPORTABLE RANGE, UNABLE TO CALCULATE. Performed at [facility], [address] [place], [place] [address]   .  Sodium 05/06/2019 133* 135 - 145 mmol/L Final  . Potassium 05/06/2019 4.3  3.5 - 5.1 mmol/L Final  . Chloride 05/06/2019 106  98 - 111 mmol/L Final  . CO2 05/06/2019 20* 22 - 32 mmol/L Final  . Glucose, Bld 05/06/2019 110* 70 - 99 mg/dL Final  . BUN 05/06/2019 11  6 - 20 mg/dL Final  . Creatinine, Ser 05/06/2019 0.54  0.44 - 1.00 mg/dL Final  . Calcium 05/06/2019 8.6* 8.9 - 10.3 mg/dL Final  . Total Protein 05/06/2019 5.6* 6.5 - 8.1 g/dL Final  . Albumin 05/06/2019 2.5* 3.5 - 5.0 g/dL Final  . AST 05/06/2019 23  15 - 41 U/L Final  . ALT 05/06/2019 17  0 - 44 U/L Final  . Alkaline Phosphatase 05/06/2019 129* 38 - 126 U/L Final  . Total Bilirubin 05/06/2019 0.6  0.3 - 1.2 mg/dL Final  . GFR calc non  Af Amer 05/06/2019 >60  >60 mL/min Final  . GFR calc Af Amer 05/06/2019 >60  >60 mL/min Final  . Anion gap 05/06/2019 7  5 - 15 Final   Performed at [facility], [address] [place], [place] [address]  . WBC 05/06/2019 8.9  4.0 - 10.5 K/uL Final  . RBC 05/06/2019 3.13* 3.87 - 5.11 MIL/uL Final  . Hemoglobin 05/06/2019 9.9* 12.0 - 15.0 g/dL Final  . HCT 05/06/2019 30.2* 36.0 - 46.0 % Final  . MCV 05/06/2019 96.5  80.0 - 100.0 fL Final  . MCH 05/06/2019 31.6  26.0 - 34.0 pg Final  . MCHC 05/06/2019 32.8  30.0 - 36.0 g/dL Final  . RDW 05/06/2019 13.0  11.5 - 15.5 % Final  . Platelets 05/06/2019 187  150 - 400 K/uL Final  . nRBC 05/06/2019 0.0  0.0 - 0.2 % Final   Performed at [facility], [address] [place], [place] [address]    Assessment: 38 y.o. s/p RLTCS stable  Plan: Patient has done well after surgery with no apparent complications.  I have discussed the post-operative course to date, and the expected progress moving forward.  The patient understands what complications to be concerned about.  I will see the patient in routine follow up, or sooner if needed.    - has not had labetalol, instructed to take and follow up for BP check laster today to see if dose adjustment warranted - Nystatin for tinea  Activity plan: No heavy lifting.  Return in about 1 week (around 05/18/2019) for BP check later today with Maudie Mercury, Postop 1 week Pennie Vanblarcom.    Malachy Mood, MD, [facility], [facility] 05/11/2019, 11:35 AM

## 2019-05-19 ENCOUNTER — Ambulatory Visit (INDEPENDENT_AMBULATORY_CARE_PROVIDER_SITE_OTHER): Payer: BLUE CROSS/BLUE SHIELD | Admitting: Obstetrics and Gynecology

## 2019-05-19 ENCOUNTER — Other Ambulatory Visit: Payer: Self-pay

## 2019-05-19 ENCOUNTER — Encounter: Payer: Self-pay | Admitting: Obstetrics and Gynecology

## 2019-05-19 VITALS — BP 138/87 | HR 54

## 2019-05-19 DIAGNOSIS — O10919 Unspecified pre-existing hypertension complicating pregnancy, unspecified trimester: Secondary | ICD-10-CM

## 2019-05-19 DIAGNOSIS — Z0130 Encounter for examination of blood pressure without abnormal findings: Secondary | ICD-10-CM

## 2019-05-19 DIAGNOSIS — Z131 Encounter for screening for diabetes mellitus: Secondary | ICD-10-CM

## 2019-05-19 NOTE — Progress Notes (Signed)
Obstetrics & Gynecology Office Visit   Chief Complaint:  Chief Complaint  Patient presents with  . Postpartum Care    B/P check    History of Present Illness: 38 y.o. [id number] being seen for follow up blood pressure check today.  The patient is (recently delivered)The established diagnosis for the patient is chronic hypertension.  She is currently on labetalol 200mg  po bid.  She reports no current symptoms attributable to her blood pressure.  Medication list reviewed medications which may contribute to BP elevation were not noted and no medications contraindicated for use in patient with current hypertension were noted.  Review of Systems: negative unless otherwise noted in HPI  Past Medical History:  Past Medical History:  Diagnosis Date  . Anxiety   . 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 Dysmenorrhea   . Gestational diabetes   . Habitual aborter, currently pregnant   . Hypertension    labetalol discontinued in December 2017  . Missed abortion 02/2016, 06/2016  . Obesity     Past Surgical History:  Past Surgical History:  Procedure Laterality Date  . BREAST ENHANCEMENT SURGERY Bilateral   . CESAREAN SECTION N/A 08/25/2017   Procedure: CESAREAN SECTION;  Surgeon: Vena AustriaStaebler, Ronnesha Mester, MD;  Location: [facility] ORS;  Service: Obstetrics;  Laterality: N/A;  birth at [id number] weight 9lb 0oz apgar 9/9  . CESAREAN SECTION N/A 05/05/2019   Procedure: CESAREAN SECTION;  Surgeon: Vena AustriaStaebler, Caiden Arteaga, MD;  Location: [facility] ORS;  Service: Obstetrics;  Laterality: N/A;  . COLPOSCOPY  01/01/2012   Benign  . DILATION AND EVACUATION N/A 02/15/2016   Procedure: DILATATION AND EVACUATION;  Surgeon: Vena AustriaAndreas Charitie Hinote, MD;  Location: [facility] ORS;  Service: Gynecology;  Laterality: N/A;  . DILATION AND EVACUATION N/A 06/10/2016   Procedure: DILATATION AND EVACUATION;  Surgeon: Vena AustriaAndreas Fajr Fife, MD;  Location: [facility] ORS;  Service: Gynecology;  Laterality: N/A;  . TONSILLECTOMY  age 38     Gynecologic History: No LMP recorded.  Obstetric History: [id number]  Family History:  Family History  Problem Relation Age of Onset  . Diabetes Mother   . Multiple sclerosis Mother   . Hypertension Mother   . Lung cancer Maternal Grandmother 50  . Lung cancer Father        died at age 38  . Hypertension Father   . Diabetes Father     Social History:  Social History   Socioeconomic History  . Marital status: Married    Spouse name: Not on file  . Number of children: 0  . Years of education: Not on file  . Highest education level: Not on file  Occupational History  . Not on file  Social Needs  . Financial resource strain: Not hard at all  . Food insecurity    Worry: Never true    Inability: Never true  . Transportation needs    Medical: No    Non-medical: No  Tobacco Use  . Smoking status: Former Smoker    Packs/day: 0.50    Years: 15.00    Pack years: 7.50    Types: Cigarettes    Quit date: 12/30/2016    Years since quitting: 2.3  . Smokeless tobacco: Former Engineer, waterUser  Substance and Sexual Activity  . Alcohol use: No  . Drug use: No  . Sexual activity: Yes    Birth control/protection: None  Lifestyle  . Physical activity    Days per week: 0 days    Minutes per session: 0 min  .  Stress: Not at all  Relationships  . Social Herbalist on phone: Patient refused    Gets together: Patient refused    Attends religious service: Patient refused    Active member of club or organization: Patient refused    Attends meetings of clubs or organizations: Patient refused    Relationship status: Patient refused  . Intimate partner violence    Fear of current or ex partner: No    Emotionally abused: No    Physically abused: No    Forced sexual activity: No  Other Topics Concern  . Not on file  Social History Narrative   Tiffany Chapman is FOB, and involved with care    Allergies:  No Known Allergies  Medications: Prior to Admission medications   Medication Sig  Start Date End Date Taking? Authorizing Provider  escitalopram (LEXAPRO) 10 MG tablet Take 1 tablet (10 mg total) by mouth daily. 04/09/18 05/04/19  Malachy Mood, MD  labetalol (NORMODYNE) 200 MG tablet Take 1 tablet (200 mg total) by mouth 2 (two) times daily. 05/11/19   Malachy Mood, MD  nystatin cream (MYCOSTATIN) Apply 1 application topically 2 (two) times daily. 05/11/19   Malachy Mood, MD  oxyCODONE-acetaminophen (PERCOCET/ROXICET) 5-325 MG tablet Take 1 tablet by mouth every 6 (six) hours as needed. 05/07/19   Rexene Agent, CNM  Prenatal Vit-Fe Fumarate-FA (PRENATAL VITAMINS PO) Take 1 tablet by mouth at bedtime.     [provider]    Physical Exam Blood pressure 138/87, pulse (!) 54, unknown if currently breastfeeding.   No LMP recorded.  General: NAD HEENT: normocephalic, anicteric Pulmonary: No increased work of breathing Extremities: noedema, no erythema, no tenderness Neurologic: Grossly intact Psychiatric: mood appropriate, affect full  Assessment: 38 y.o. M5Y6503 presenting for blood pressure evaluation today  Plan: Problem List Items Addressed This Visit      Cardiovascular and Mediastinum   Chronic hypertension affecting pregnancy - Primary    Other Visit Diagnoses    BP check       Screening for diabetes mellitus       Relevant Orders   Glucose tolerance, 2 hours      1) Blood pressure - blood pressure at today's visit is normotensive.  As a result continue labetalol at current dose. - additional blood work was not obtained and 24-hr urine catecholamines   2) GDM - 2-hr OGTT ordered  3) Contraception -mirena at 6 week check up  4) Return in about 4 weeks (around 06/16/2019) for 6 week postpartum and Mirena IUD insertion.    Malachy Mood, MD, [facility], [place]

## 2019-05-20 ENCOUNTER — Telehealth: Payer: Self-pay | Admitting: Obstetrics and Gynecology

## 2019-05-20 NOTE — Telephone Encounter (Signed)
-----   Message from Malachy Mood, MD sent at 05/19/2019 10:45 PM EDT ----- Regarding: 2-hr glucose tolerance test Patient will need 2-hr glucose tolerance test at time of her 6 week.  I forgot to specify in her follow up today.   Malachy Mood, MD, [facility], [facility] 05/19/2019, 10:46 PM

## 2019-05-20 NOTE — Telephone Encounter (Signed)
Called and left voice mail for patient to call back to be schedule °

## 2019-05-23 NOTE — Telephone Encounter (Signed)
Called and left voice mail for patient to call back to be schedule °

## 2019-06-08 ENCOUNTER — Telehealth: Payer: Self-pay

## 2019-06-08 ENCOUNTER — Other Ambulatory Visit: Payer: Self-pay | Admitting: Obstetrics and Gynecology

## 2019-06-08 MED ORDER — ESCITALOPRAM OXALATE 10 MG PO TABS: 10.0000 mg | ORAL_TABLET | Freq: Every day | ORAL | 3 refills | Status: DC

## 2019-06-08 NOTE — Telephone Encounter (Signed)
[facility] in [facility] sent a refill request of Escitalopram 10mg .

## 2019-06-08 NOTE — Telephone Encounter (Signed)
Refill sent.

## 2019-06-10 ENCOUNTER — Other Ambulatory Visit: Payer: BLUE CROSS/BLUE SHIELD

## 2019-06-10 ENCOUNTER — Other Ambulatory Visit: Payer: Self-pay

## 2019-06-10 DIAGNOSIS — Z131 Encounter for screening for diabetes mellitus: Secondary | ICD-10-CM

## 2019-06-11 LAB — GLUCOSE TOLERANCE, 2 HOURS
Glucose, 2 hour: 61 mg/dL — ABNORMAL LOW (ref 65–139)
Glucose, GTT - Fasting: 89 mg/dL (ref 65–99)

## 2019-06-17 ENCOUNTER — Other Ambulatory Visit: Payer: Self-pay

## 2019-06-17 ENCOUNTER — Encounter: Payer: Self-pay | Admitting: Obstetrics and Gynecology

## 2019-06-17 ENCOUNTER — Ambulatory Visit (INDEPENDENT_AMBULATORY_CARE_PROVIDER_SITE_OTHER): Payer: BLUE CROSS/BLUE SHIELD | Admitting: Obstetrics and Gynecology

## 2019-06-17 VITALS — BP 122/80 | Ht 67.0 in | Wt 259.0 lb

## 2019-06-17 DIAGNOSIS — Z30430 Encounter for insertion of intrauterine contraceptive device: Secondary | ICD-10-CM

## 2019-06-17 DIAGNOSIS — O24414 Gestational diabetes mellitus in pregnancy, insulin controlled: Secondary | ICD-10-CM

## 2019-06-17 DIAGNOSIS — Z1389 Encounter for screening for other disorder: Secondary | ICD-10-CM

## 2019-06-17 MED ORDER — ESCITALOPRAM OXALATE 10 MG PO TABS: 10.0000 mg | ORAL_TABLET | Freq: Every day | ORAL | 3 refills | Status: DC

## 2019-06-17 NOTE — Progress Notes (Signed)
Postpartum Visit  Chief Complaint:  Chief Complaint  Patient presents with  . Postpartum Care    C/S 8/27  . Contraception    History of Present Illness: Patient is a 38 y.o. [id number] presents for postpartum visit.  Date of delivery:  Cesarean Section: Elective repeat Pregnancy or labor problems:  Yes, HTN, GDM Any problems since the delivery:  no  Newborn Details:  SINGLETON :  1. BabyGender female. Birth weight: 7lbs 13oz Maternal Details:  Breast or formula feeding: plans to breastfeed Intercourse: No  Contraception after delivery: No  Any bowel or bladder issues: No  Post partum depression/anxiety noted:  no Edinburgh Post-Partum Depression Score: 4 Date of last PAP:01/14/17  NIL and HR HPV negative   Review of Systems: ROS  The following portions of the patient's history were reviewed and updated as appropriate: allergies, current medications, past family history, past medical history, past social history, past surgical history and problem list.  Past Medical History:  Past Medical History:  Diagnosis Date  . Anxiety   . 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 Dysmenorrhea   . Gestational diabetes   . Habitual aborter, currently pregnant   . Hypertension    labetalol discontinued in December 2017  . Missed abortion 02/2016, 06/2016  . Obesity     Past Surgical History:  Past Surgical History:  Procedure Laterality Date  . BREAST ENHANCEMENT SURGERY Bilateral   . CESAREAN SECTION N/A 08/25/2017   Procedure: CESAREAN SECTION;  Surgeon: Tiffany Mood, MD;  Location: [facility] ORS;  Service: Obstetrics;  Laterality: N/A;  birth at 18 weight 9lb 0oz apgar 9/9  . CESAREAN SECTION N/A 05/05/2019   Procedure: CESAREAN SECTION;  Surgeon: Tiffany Mood, MD;  Location: [facility] ORS;  Service: Obstetrics;  Laterality: N/A;  . COLPOSCOPY  01/01/2012   Benign  . DILATION AND EVACUATION N/A 02/15/2016   Procedure: DILATATION AND EVACUATION;   Surgeon: Tiffany Mood, MD;  Location: [facility] ORS;  Service: Gynecology;  Laterality: N/A;  . DILATION AND EVACUATION N/A 06/10/2016   Procedure: DILATATION AND EVACUATION;  Surgeon: Tiffany Mood, MD;  Location: [facility] ORS;  Service: Gynecology;  Laterality: N/A;  . TONSILLECTOMY  age 80    Family History:  Family History  Problem Relation Age of Onset  . Diabetes Mother   . Multiple sclerosis Mother   . Hypertension Mother   . Lung cancer Maternal Grandmother 86  . Lung cancer Father        died at age 48  . Hypertension Father   . Diabetes Father     Social History:  Social History   Socioeconomic History  . Marital status: Married    Spouse name: Not on file  . Number of children: 0  . Years of education: Not on file  . Highest education level: Not on file  Occupational History  . Not on file  Social Needs  . Financial resource strain: Not hard at all  . Food insecurity    Worry: Never true    Inability: Never true  . Transportation needs    Medical: No    Non-medical: No  Tobacco Use  . Smoking status: Former Smoker    Packs/day: 0.50    Years: 15.00    Pack years: 7.50    Types: Cigarettes    Quit date: 12/30/2016    Years since quitting: 2.4  . Smokeless tobacco: Former Network engineer and Sexual Activity  . Alcohol use: No  .  Drug use: No  . Sexual activity: Yes    Birth control/protection: None  Lifestyle  . Physical activity    Days per week: 0 days    Minutes per session: 0 min  . Stress: Not at all  Relationships  . Social Musicianconnections    Talks on phone: Patient refused    Gets together: Patient refused    Attends religious service: Patient refused    Active member of club or organization: Patient refused    Attends meetings of clubs or organizations: Patient refused    Relationship status: Patient refused  . Intimate partner violence    Fear of current or ex partner: No    Emotionally abused: No    Physically abused: No    Forced sexual  activity: No  Other Topics Concern  . Not on file  Social History Narrative   Tiffany Chapman is FOB, and involved with care    Allergies:  No Known Allergies  Medications: Prior to Admission medications   Medication Sig Start Date End Date Taking? Authorizing Provider  escitalopram (LEXAPRO) 10 MG tablet Take 1 tablet (10 mg total) by mouth daily. 06/08/19 06/07/20 Yes Vena AustriaStaebler, Gwenith Tschida, MD  labetalol (NORMODYNE) 200 MG tablet Take 1 tablet (200 mg total) by mouth 2 (two) times daily. 05/11/19  Yes Vena AustriaStaebler, Retaj Hilbun, MD    Physical Exam Blood pressure 122/80, height 5\' 7"  (1.702 m), weight 259 lb (117.5 kg), not currently breastfeeding.  General: NAD HEENT: normocephalic, anicteric Pulmonary: No increased work of breathing Abdomen: NABS, soft, non-tender, non-distended.  Umbilicus without lesions.  No hepatomegaly, splenomegaly or masses palpable. No evidence of hernia. Incision well healed Genitourinary:  External: Normal external female genitalia.  Normal urethral meatus, normal  Bartholin's and Skene's glands.    Vagina: Normal vaginal mucosa, no evidence of prolapse.    Cervix: Grossly normal in appearance, no bleeding  Uterus: Non-enlarged, mobile, normal contour.  No CMT  Adnexa: ovaries non-enlarged, no adnexal masses  Rectal: deferred Extremities: no edema, erythema, or tenderness Neurologic: Grossly intact Psychiatric: Chapman appropriate, affect full    GYNECOLOGY OFFICE PROCEDURE NOTE  Tiffany Chapman is a 38 y.o. [id number] here for a [place] UD insertion. No GYN concerns.  Last pap smear was on 01/14/2017 and was normal.    IUD Insertion Procedure Note Patient identified, informed consent performed, consent signed.   Discussed risks of irregular bleeding, cramping, infection, malpositioning, expulsion or uterine perforation of the IUD (1:1000 placements)  which may require further procedure such as laparoscopy.  IUD while effective at preventing pregnancy do not prevent transmission  of sexually transmitted diseases and use of barrier methods for this purpose was discussed. Time out was performed.  Urine pregnancy test negative.  Speculum placed in the vagina.  Cervix visualized.  Cleaned with Betadine x 2.  Grasped anteriorly with a single tooth tenaculum.  Uterus sounded to 9 cm. IUD placed per manufacturer's recommendations.  Strings trimmed to 3 cm. Tenaculum was removed, good hemostasis noted.  Patient tolerated procedure well.   Patient was given post-procedure instructions.  She was advised to have backup contraception for one week.  Patient was also asked to check IUD strings periodically and follow up in 6 weeks for IUD check.  IUD insertion CPT 58300,  Skyla J7301 Mirena J7298 Liletta J7297 Paraguard J7300 Rutha BouchardKyleena [phone] Modifer 25, plus Modifer 79 is done during a global billing visit   Vena AustriaAndreas Cristino Degroff, MD, Merlinda FrederickFACOG [facility], [facility]    Inocente SallesEdinburgh Postnatal Depression  Scale - 06/17/19 1422      Edinburgh Postnatal Depression Scale:  In the Past 7 Days   I have been able to laugh and see the funny side of things.  0    I have looked forward with enjoyment to things.  0    I have blamed myself unnecessarily when things went wrong.  1    I have been anxious or worried for no good reason.  2    I have felt scared or panicky for no good reason.  0    Things have been getting on top of me.  1    I have been so unhappy that I have had difficulty sleeping.  0    I have felt sad or miserable.  0    I have been so unhappy that I have been crying.  0    The thought of harming myself has occurred to me.  0    Edinburgh Postnatal Depression Scale Total  4       Assessment: 38 y.o. [id number] presenting for 6 week postpartum visit  Plan: Problem List Items Addressed This Visit      Endocrine   Insulin controlled gestational diabetes mellitus (GDM) in third trimester - Primary    Other Visit Diagnoses    6 weeks postpartum follow-up        Encounter for IUD insertion          1) Contraception - Education given regarding options for contraception, as well as compatibility with breast feeding if applicable.  Patient plans on IUD for contraception.  2)  Pap - ASCCP guidelines and rational discussed.  ASCCP guidelines and rational discussed.  Patient opts for every 3 years screening interval  3) Patient underwent screening for postpartum depression with no signs of depression  4) GDM - normal 2-hr last week  5)  Return in about 6 weeks (around 07/29/2019) for 6 week IUD string check.   Vena Austria, MD, Evern Core [facility], [facility] 06/17/2019, 2:25 PM

## 2019-08-02 ENCOUNTER — Ambulatory Visit (INDEPENDENT_AMBULATORY_CARE_PROVIDER_SITE_OTHER): Payer: BLUE CROSS/BLUE SHIELD | Admitting: Obstetrics and Gynecology

## 2019-08-02 ENCOUNTER — Other Ambulatory Visit: Payer: Self-pay

## 2019-08-02 ENCOUNTER — Encounter: Payer: Self-pay | Admitting: Obstetrics and Gynecology

## 2019-08-02 VITALS — BP 142/94 | HR 66 | Wt 262.0 lb

## 2019-08-02 DIAGNOSIS — Z30431 Encounter for routine checking of intrauterine contraceptive device: Secondary | ICD-10-CM

## 2019-08-02 NOTE — Progress Notes (Signed)
Obstetrics & Gynecology Office Visit   Chief Complaint:  Chief Complaint  Patient presents with  . Follow-up    IUD string check    History of Present Illness: 38 y.o. patient presenting for follow up of Mirena IUD placement 6+ weeks ago.  The indication for her IUD was contraception.  She denies any complications since her IUD placement.  Still having some occasional spotting.  Has not attempted to feel strings.    Review of Systems: Review of Systems  Constitutional: Negative.   Gastrointestinal: Negative.   Genitourinary: Negative.     Past Medical History:  Past Medical History:  Diagnosis Date  . Anxiety   . 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 Dysmenorrhea   . Gestational diabetes   . Habitual aborter, currently pregnant   . Hypertension    labetalol discontinued in December 2017  . Missed abortion 02/2016, 06/2016  . Obesity     Past Surgical History:  Past Surgical History:  Procedure Laterality Date  . BREAST ENHANCEMENT SURGERY Bilateral   . CESAREAN SECTION N/A 08/25/2017   Procedure: CESAREAN SECTION;  Surgeon: Malachy Mood, MD;  Location: [facility];  Service: Obstetrics;  Laterality: N/A;  birth at 60 weight 9lb 0oz apgar 9/9  . CESAREAN SECTION N/A 05/05/2019   Procedure: CESAREAN SECTION;  Surgeon: Malachy Mood, MD;  Location: [facility];  Service: Obstetrics;  Laterality: N/A;  . COLPOSCOPY  01/01/2012   Benign  . DILATION AND EVACUATION N/A 02/15/2016   Procedure: DILATATION AND EVACUATION;  Surgeon: Malachy Mood, MD;  Location: [facility];  Service: Gynecology;  Laterality: N/A;  . DILATION AND EVACUATION N/A 06/10/2016   Procedure: DILATATION AND EVACUATION;  Surgeon: Malachy Mood, MD;  Location: [facility];  Service: Gynecology;  Laterality: N/A;  . TONSILLECTOMY  age 24    Gynecologic History: No LMP recorded. (Menstrual status: IUD).  Obstetric History: [id number]  Family History:  Family History  Problem  Relation Age of Onset  . Diabetes Mother   . Multiple sclerosis Mother   . Hypertension Mother   . Lung cancer Maternal Grandmother 77  . Lung cancer Father        died at age 83  . Hypertension Father   . Diabetes Father     Social History:  Social History   Socioeconomic History  . Marital status: Married    Spouse name: Not on file  . Number of children: 0  . Years of education: Not on file  . Highest education level: Not on file  Occupational History  . Not on file  Social Needs  . Financial resource strain: Not hard at all  . Food insecurity    Worry: Never true    Inability: Never true  . Transportation needs    Medical: No    Non-medical: No  Tobacco Use  . Smoking status: Former Smoker    Packs/day: 0.50    Years: 15.00    Pack years: 7.50    Types: Cigarettes    Quit date: 12/30/2016    Years since quitting: 2.5  . Smokeless tobacco: Former Network engineer and Sexual Activity  . Alcohol use: No  . Drug use: No  . Sexual activity: Yes    Birth control/protection: I.U.D.  Lifestyle  . Physical activity    Days per week: 0 days    Minutes per session: 0 min  . Stress: Not at all  Relationships  . Social connections  Talks on phone: Patient refused    Gets together: Patient refused    Attends religious service: Patient refused    Active member of club or organization: Patient refused    Attends meetings of clubs or organizations: Patient refused    Relationship status: Patient refused  . Intimate partner violence    Fear of current or ex partner: No    Emotionally abused: No    Physically abused: No    Forced sexual activity: No  Other Topics Concern  . Not on file  Social History Narrative   Greig Castilla is FOB, and involved with care    Allergies:  No Known Allergies  Medications: Prior to Admission medications   Medication Sig Start Date End Date Taking? Authorizing Provider  levonorgestrel (MIRENA) 20 MCG/24HR IUD 1 each by Intrauterine  route once.   Yes [provider]    Physical Exam Blood pressure (!) 142/94, pulse 66, weight 262 lb (118.8 kg), not currently breastfeeding. No LMP recorded. (Menstrual status: IUD).  General: NAD HEENT: normocephalic, anicteric Pulmonary: No increased work of breathing  Genitourinary:  External: Normal external female genitalia.  Normal urethral meatus, normal  Bartholin's and Skene's glands.    Vagina: Normal vaginal mucosa, no evidence of prolapse.    Cervix: Grossly normal in appearance, no bleeding, IUD strings visualized 2cm  Uterus: Non-enlarged, mobile, normal contour.  No CMT  Adnexa: ovaries non-enlarged, no adnexal masses  Rectal: deferred  Lymphatic: no evidence of inguinal lymphadenopathy Extremities: no edema, erythema, or tenderness Neurologic: Grossly intact Psychiatric: mood appropriate, affect full  Female chaperone present for pelvic and breast  portions of the physical exam  Assessment: 38 y.o. [id number] No problem-specific Assessment & Plan notes found for this encounter.   Plan: Problem List Items Addressed This Visit    None    Visit Diagnoses    IUD check up    -  Primary       1.  The patient was given instructions to check her IUD strings monthly and call with any problems or concerns.  She should call for fevers, chills, abnormal vaginal discharge, pelvic pain, or other complaints.  2.   IUDs while effective at preventing pregnancy do not prevent transmission of sexually transmitted diseases and use of barrier methods for this purpose was discussed.  Low overall incidence of failure with 99.7% efficacy rate in typical use.  The patient has not contraindication to IUD placement.  3.  She will return for a annual exam in 1 year.  All questions answered.  4) A total of 15 minutes were spent in face-to-face contact with the patient during this encounter with over half of that time devoted to counseling and coordination of care.  5) No  follow-ups on file.   Vena Austria, MD, Evern Core [facility], [facility] 08/02/2019, 2:32 PM

## 2020-06-26 ENCOUNTER — Other Ambulatory Visit: Payer: Self-pay | Admitting: Obstetrics and Gynecology

## 2021-08-20 ENCOUNTER — Ambulatory Visit: Payer: 59 | Admitting: Medical-Surgical

## 2021-08-20 ENCOUNTER — Other Ambulatory Visit: Payer: Self-pay

## 2021-08-20 ENCOUNTER — Encounter: Payer: Self-pay | Admitting: Medical-Surgical

## 2021-08-20 VITALS — BP 159/100 | HR 76 | Resp 20 | Ht 67.0 in | Wt 256.0 lb

## 2021-08-20 DIAGNOSIS — R030 Elevated blood-pressure reading, without diagnosis of hypertension: Secondary | ICD-10-CM

## 2021-08-20 DIAGNOSIS — Z7689 Persons encountering health services in other specified circumstances: Secondary | ICD-10-CM

## 2021-08-20 DIAGNOSIS — F419 Anxiety disorder, unspecified: Secondary | ICD-10-CM

## 2021-08-20 DIAGNOSIS — M533 Sacrococcygeal disorders, not elsewhere classified: Secondary | ICD-10-CM | POA: Diagnosis not present

## 2021-08-20 DIAGNOSIS — Z23 Encounter for immunization: Secondary | ICD-10-CM

## 2021-08-20 DIAGNOSIS — F32A Depression, unspecified: Secondary | ICD-10-CM

## 2021-08-20 MED ORDER — ESCITALOPRAM OXALATE 10 MG PO TABS: ORAL_TABLET | ORAL | 1 refills | Status: DC

## 2021-08-20 NOTE — Progress Notes (Signed)
New Patient Office Visit  Subjective:  Patient ID: Tiffany Chapman, female    DOB: 1980/12/01  Age: 40 y.o. MRN: [id number]  CC:  Chief Complaint  Patient presents with   Establish Care     HPI Tiffany Chapman presents to establish care.  She is very pleasant 40 year old female who has been having difficulty with right sided low back pain that extends into the buttocks and down the posterior right thigh.  This has been going on intermittently for several months and tends to flare when she becomes more physically active.  She has been trying to get back to exercising to help with weight loss however whenever she does, the increased activity brings on a flare of the pain.  She has been taking ibuprofen and stretching which does provide some relief and the flares usually resolve in about 48 to 72 hours.  Unfortunately, she is at a point where she is afraid to do increased activities because she is worried that it may cause the pain to return.  Previously treated for anxiety with Lexapro 10 mg daily.  She has been off this for about a year.  She was able to stop this during her pregnancies but now that she has 2 toddlers at home and an increase in overall anxiety, she is at a point where she feels like she needs to restart the medication.  She is not currently doing any counseling and is not sure if this is financially feasible or would fit in with her very busy schedule.  Denies SI/HI.  Past Medical History:  Diagnosis Date   Anxiety    ASCUS with positive high risk HPV cervical 12/22/2011   Dysmenorrhea    Gestational diabetes    Habitual aborter, currently pregnant    Hypertension    labetalol discontinued in December 2017   Missed abortion 02/2016, 06/2016   Obesity     Past Surgical History:  Procedure Laterality Date   BREAST ENHANCEMENT SURGERY Bilateral    BREAST SURGERY  2010   Augmentation   CESAREAN SECTION N/A 08/25/2017   Procedure: CESAREAN SECTION;  Surgeon: Vena Austria, MD;  Location: [facility];  Service: Obstetrics;  Laterality: N/A;  birth at 68 weight 9lb 0oz apgar 9/9   CESAREAN SECTION N/A 05/05/2019   Procedure: CESAREAN SECTION;  Surgeon: Vena Austria, MD;  Location: [facility];  Service: Obstetrics;  Laterality: N/A;   COLPOSCOPY  01/01/2012   Benign   DILATION AND EVACUATION N/A 02/15/2016   Procedure: DILATATION AND EVACUATION;  Surgeon: Vena Austria, MD;  Location: [facility];  Service: Gynecology;  Laterality: N/A;   DILATION AND EVACUATION N/A 06/10/2016   Procedure: DILATATION AND EVACUATION;  Surgeon: Vena Austria, MD;  Location: [facility];  Service: Gynecology;  Laterality: N/A;   TONSILLECTOMY  age 61    Family History  Problem Relation Age of Onset   Diabetes Mother    Multiple sclerosis Mother    Hypertension Mother    Lung cancer Maternal Grandmother 8   Lung cancer Father        died at age 15   Hypertension Father    Diabetes Father     Social History   Socioeconomic History   Marital status: Married    Spouse name: Not on file   Number of children: 0   Years of education: Not on file   Highest education level: Not on file  Occupational History   Not on file  Tobacco Use  Smoking status: Every Day    Packs/day: 0.50    Years: 15.00    Pack years: 7.50    Types: Cigarettes    Last attempt to quit: 12/30/2016    Years since quitting: 4.6   Smokeless tobacco: Former  Building services engineer Use: Never used  Substance and Sexual Activity   Alcohol use: No    Comment: rarely   Drug use: Never   Sexual activity: Yes    Birth control/protection: I.U.D.  Other Topics Concern   Not on file  Social History Narrative   Tiffany Chapman is FOB, and involved with care   Social Determinants of Health   Financial Resource Strain: Not on file  Food Insecurity: Not on file  Transportation Needs: Not on file  Physical Activity: Not on file  Stress: Not on file  Social Connections: Not on file  Intimate  Partner Violence: Not on file    ROS Review of Systems  Constitutional:  Positive for fatigue. Negative for chills, fever and unexpected weight change.  Respiratory:  Negative for cough, chest tightness and shortness of breath.   Cardiovascular:  Negative for chest pain, palpitations and leg swelling.  Gastrointestinal:  Negative for abdominal pain, constipation, diarrhea, nausea and vomiting.  Endocrine: Negative for cold intolerance and heat intolerance.  Genitourinary:  Negative for dysuria, frequency, urgency, vaginal bleeding and vaginal discharge.  Musculoskeletal:  Positive for back pain.  Skin:  Negative for rash and wound.  Neurological:  Negative for dizziness, light-headedness and headaches.  Hematological:  Does not bruise/bleed easily.  Psychiatric/Behavioral:  Negative for self-injury, sleep disturbance and suicidal ideas. The patient is nervous/anxious.    Objective:   Today's Vitals: BP (!) 159/100 (BP Location: Left Arm, Patient Position: Sitting, Cuff Size: Large)   Pulse 76   Resp 20   Ht 5\' 7"  (1.702 m)   Wt 256 lb (116.1 kg)   SpO2 99%   BMI 40.10 kg/m   Physical Exam Vitals reviewed.  Constitutional:      General: She is not in acute distress.    Appearance: Normal appearance.  HENT:     Head: Normocephalic and atraumatic.  Cardiovascular:     Rate and Rhythm: Normal rate and regular rhythm.     Pulses: Normal pulses.     Heart sounds: Normal heart sounds. No murmur heard.   No friction rub. No gallop.  Pulmonary:     Effort: Pulmonary effort is normal. No respiratory distress.     Breath sounds: Normal breath sounds. No wheezing.  Skin:    General: Skin is warm and dry.  Neurological:     Mental Status: She is alert and oriented to person, place, and time.  Psychiatric:        Mood and Affect: Mood normal.        Behavior: Behavior normal.        Thought Content: Thought content normal.        Judgment: Judgment normal.    Assessment &  Plan:   1. Encounter to establish care Reviewed available information and discussed care concerns with patient.   2. Anxiety and depression Restarting Lexapro 5 mg daily for 8 days then increasing to 10 mg daily.  Discussed the benefit of counseling but we will hold off on this for now.  3. SI (sacroiliac) joint dysfunction No current symptoms today so we will hold off on treatment for now.  Home exercises provided to help with strengthening of the surrounding muscles.  Recommend conservative measures including anti-inflammatories, heat, ice, and gentle stretching with flares.  Would recommend slow return to activity to prevent worsening of symptoms.  4. Elevated blood pressure reading Blood pressure elevated on arrival.  Advised to check blood pressure at home with goal of 130/80 or less.  Aim for low-sodium diet.  Work on weight loss and increasing intentional exercise.  If blood pressure consistently elevated, we will need to discuss starting medication in addition to lifestyle modifications.  5. Flu vaccine need Flu vaccine given in office today. - Flu Vaccine QUAD 26mo+IM (Fluarix, Fluzone & Alfiuria Quad PF)   Outpatient Encounter Medications as of 08/20/2021  Medication Sig   escitalopram (LEXAPRO) 10 MG tablet Take 5mg  (1/2 tablet) daily for 8 days then increase to 10mg  (1 full tablet) daily.   levonorgestrel (MIRENA) 20 MCG/24HR IUD 1 each by Intrauterine route once.   [DISCONTINUED] escitalopram (LEXAPRO) 10 MG tablet TAKE 1 TABLET(10 MG) BY MOUTH DAILY   No facility-administered encounter medications on file as of 08/20/2021.   Follow-up: Return for mood follow up in 4-6 weeks.   , DNP, APRN, FNP-BC [facility]

## 2021-08-22 ENCOUNTER — Encounter: Payer: Self-pay | Admitting: Certified Nurse Midwife

## 2021-09-15 ENCOUNTER — Encounter: Payer: Self-pay | Admitting: Medical-Surgical

## 2021-09-16 NOTE — Telephone Encounter (Signed)
Called pt, left VM with direct callback number regarding making a BP check appointment.

## 2021-09-20 ENCOUNTER — Ambulatory Visit (INDEPENDENT_AMBULATORY_CARE_PROVIDER_SITE_OTHER): Payer: 59 | Admitting: Medical-Surgical

## 2021-09-20 ENCOUNTER — Other Ambulatory Visit: Payer: Self-pay

## 2021-09-20 ENCOUNTER — Ambulatory Visit: Payer: Medicaid Other

## 2021-09-20 VITALS — BP 141/86 | HR 87

## 2021-09-20 DIAGNOSIS — I10 Essential (primary) hypertension: Secondary | ICD-10-CM | POA: Diagnosis not present

## 2021-09-20 MED ORDER — HYDROCHLOROTHIAZIDE 12.5 MG PO TABS: 12.5000 mg | ORAL_TABLET | Freq: Every day | ORAL | 3 refills | Status: DC

## 2021-09-20 NOTE — Progress Notes (Signed)
Established Patient Office Visit  Subjective:  Patient ID: Tiffany Chapman, female    DOB: 13-Sep-1980  Age: 41 y.o. MRN: [id number]  CC:  Chief Complaint  Patient presents with   Hypertension    HPI [name] presents for blood pressure check. She reports her home readings have been 150-156/80-90. Denies chest pain, shortness of breath or dizziness.   Past Medical History:  Diagnosis Date   Anxiety    ASCUS with positive high risk HPV cervical 12/22/2011   Dysmenorrhea    Gestational diabetes    Habitual aborter, currently pregnant    HPV in female    Hypertension    labetalol discontinued in December 2017   Missed abortion 02/2016, 06/2016   Obesity     Past Surgical History:  Procedure Laterality Date   BREAST ENHANCEMENT SURGERY Bilateral    BREAST SURGERY  2010   Augmentation   CESAREAN SECTION N/A 08/25/2017   Procedure: CESAREAN SECTION;  Surgeon: Malachy Mood, MD;  Location: [facility];  Service: Obstetrics;  Laterality: N/A;  birth at 62 weight 9lb 0oz apgar 9/9   CESAREAN SECTION N/A 05/05/2019   Procedure: CESAREAN SECTION;  Surgeon: Malachy Mood, MD;  Location: [facility];  Service: Obstetrics;  Laterality: N/A;   COLPOSCOPY  01/01/2012   Benign   DILATION AND EVACUATION N/A 02/15/2016   Procedure: DILATATION AND EVACUATION;  Surgeon: Malachy Mood, MD;  Location: [facility];  Service: Gynecology;  Laterality: N/A;   DILATION AND EVACUATION N/A 06/10/2016   Procedure: DILATATION AND EVACUATION;  Surgeon: Malachy Mood, MD;  Location: [facility];  Service: Gynecology;  Laterality: N/A;   TONSILLECTOMY  age 75    Family History  Problem Relation Age of Onset   Diabetes Mother    Multiple sclerosis Mother    Hypertension Mother    Lung cancer Father        died at age 59   Hypertension Father    Diabetes Father    Cancer Maternal Grandmother    Lung cancer Maternal Grandmother 42    Social History   Socioeconomic History   Marital  status: Married    Spouse name: Not on file   Number of children: 0   Years of education: Not on file   Highest education level: Not on file  Occupational History   Not on file  Tobacco Use   Smoking status: Every Day    Packs/day: 0.50    Years: 15.00    Pack years: 7.50    Types: Cigarettes    Last attempt to quit: 12/30/2016    Years since quitting: 4.7   Smokeless tobacco: Former  Scientific laboratory technician Use: Never used  Substance and Sexual Activity   Alcohol use: No    Comment: rarely   Drug use: Never   Sexual activity: Yes    Birth control/protection: I.U.D.  Other Topics Concern   Not on file  Social History Narrative   [name] is FOB, and involved with care   Social Determinants of Health   Financial Resource Strain: Not on file  Food Insecurity: Not on file  Transportation Needs: Not on file  Physical Activity: Not on file  Stress: Not on file  Social Connections: Not on file  Intimate Partner Violence: Not on file    Outpatient Medications Prior to Visit  Medication Sig Dispense Refill   escitalopram (LEXAPRO) 10 MG tablet Take 5mg  (1/2 tablet) daily for 8 days then increase to 10mg  (  1 full tablet) daily. 90 tablet 1   No facility-administered medications prior to visit.    No Known Allergies  ROS Review of Systems    Objective:    Physical Exam  BP (!) 141/86    Pulse 87    SpO2 99%  Wt Readings from Last 3 Encounters:  08/20/21 256 lb (116.1 kg)  08/02/19 262 lb (118.8 kg)  06/17/19 259 lb (117.5 kg)     Health Maintenance Due  Topic Date Due   Hepatitis C Screening  Never done   COVID-19 Vaccine (3 - Booster for Pfizer series) 06/02/2020    There are no preventive care reminders to display for this patient.  Lab Results  Component Value Date   TSH 1.220 01/23/2017   Lab Results  Component Value Date   WBC 8.9 05/06/2019   HGB 9.9 (L) 05/06/2019   HCT 30.2 (L) 05/06/2019   MCV 96.5 05/06/2019   PLT 187 05/06/2019   Lab  Results  Component Value Date   NA 133 (L) 05/06/2019   K 4.3 05/06/2019   CO2 20 (L) 05/06/2019   GLUCOSE 110 (H) 05/06/2019   BUN 11 05/06/2019   CREATININE 0.54 05/06/2019   BILITOT 0.6 05/06/2019   ALKPHOS 129 (H) 05/06/2019   AST 23 05/06/2019   ALT 17 05/06/2019   PROT 5.6 (L) 05/06/2019   ALBUMIN 2.5 (L) 05/06/2019   CALCIUM 8.6 (L) 05/06/2019   ANIONGAP 7 05/06/2019   No results found for: CHOL No results found for: HDL No results found for: LDLCALC No results found for: TRIG No results found for: CHOLHDL No results found for: HGBA1C    Assessment & Plan:  Hypertension - Blood pressure still elevated, per Joy, start HCTZ 12.5 mg once in the morning. Follow up in two weeks.   Problem List Items Addressed This Visit   None Visit Diagnoses     Hypertension, unspecified type    -  Primary       No orders of the defined types were placed in this encounter.   Follow-up: Return in about 2 years (around 09/21/2023) for HTN, blood pressure check. Durene Romans, Monico Blitz, [place]

## 2021-09-20 NOTE — Progress Notes (Signed)
Agree with documentation as above.  Hydrochlorothiazide 12.5 mg daily sent to pharmacy.  Continue monitoring blood pressure at home with goal of 130/80 or less.  ___________________________________________ Thayer Ohm, DNP, APRN, FNP-BC [facility] [place]

## 2021-10-07 ENCOUNTER — Other Ambulatory Visit: Payer: Self-pay

## 2021-10-07 ENCOUNTER — Encounter: Payer: Self-pay | Admitting: Medical-Surgical

## 2021-10-07 ENCOUNTER — Ambulatory Visit (INDEPENDENT_AMBULATORY_CARE_PROVIDER_SITE_OTHER): Payer: 59 | Admitting: Medical-Surgical

## 2021-10-07 VITALS — BP 151/96 | HR 80 | Resp 20 | Ht 67.0 in | Wt 257.0 lb

## 2021-10-07 DIAGNOSIS — Z0000 Encounter for general adult medical examination without abnormal findings: Secondary | ICD-10-CM | POA: Diagnosis not present

## 2021-10-07 DIAGNOSIS — Z131 Encounter for screening for diabetes mellitus: Secondary | ICD-10-CM | POA: Diagnosis not present

## 2021-10-07 DIAGNOSIS — Z1159 Encounter for screening for other viral diseases: Secondary | ICD-10-CM | POA: Diagnosis not present

## 2021-10-07 DIAGNOSIS — Z1329 Encounter for screening for other suspected endocrine disorder: Secondary | ICD-10-CM | POA: Diagnosis not present

## 2021-10-07 DIAGNOSIS — I10 Essential (primary) hypertension: Secondary | ICD-10-CM

## 2021-10-07 DIAGNOSIS — F32A Depression, unspecified: Secondary | ICD-10-CM

## 2021-10-07 DIAGNOSIS — F419 Anxiety disorder, unspecified: Secondary | ICD-10-CM

## 2021-10-07 DIAGNOSIS — Z1231 Encounter for screening mammogram for malignant neoplasm of breast: Secondary | ICD-10-CM | POA: Diagnosis not present

## 2021-10-07 MED ORDER — HYDROCHLOROTHIAZIDE 25 MG PO TABS: 25.0000 mg | ORAL_TABLET | Freq: Every day | ORAL | 1 refills | Status: DC

## 2021-10-07 NOTE — Patient Instructions (Addendum)

## 2021-10-07 NOTE — Progress Notes (Signed)
HPI: Tiffany Chapman is a 41 y.o. female who  has a past medical history of Anxiety, ASCUS with positive high risk HPV cervical (12/22/2011), Dysmenorrhea, Gestational diabetes, Habitual aborter, currently pregnant, HPV in female, Hypertension, Missed abortion (02/2016, 06/2016), and Obesity.  she presents to [facility] today, 10/07/21,  for chief complaint of: Annual physical exam  Dentist: Had issues last summer, due for follow-up Eye exam: Greater than 1 year, wears glasses Exercise: 3-4 times weekly, does various exercises including Zumba, YouTube videos, and other cardio Diet: No restrictions Pap smear: Sees OB/GYN, needs referral for new 1 Mammogram: Never done, okay to order today COVID vaccine: Done, no booster  Concerns: None  Mood-taking Lexapro 10 mg daily, tolerating well without side effects.  Feels the medication is helping greatly with her overall mood.  Denies SI/HI.  Hypertension-taking hydrochlorothiazide 12.5 mg daily, tolerating well without side effects.  Has not been checking blood pressure at home.  Has tried to make some different dietary choices.  Past medical, surgical, social and family history reviewed:  Patient Active Problem List   Diagnosis Date Noted   Maternal care for scar from previous cesarean delivery 05/05/2019   Indication for care in labor and delivery, antepartum 05/03/2019   Insulin controlled gestational diabetes mellitus (GDM) in third trimester 02/28/2019   History of polyhydramnios 10/07/2018   Maternal obesity, antepartum 01/18/2017   BMI 40.0-44.9, adult ([place]) 01/18/2017   Chronic hypertension affecting pregnancy 01/18/2017   Anxiety and depression 01/18/2017   Supervision of high risk pregnancy, antepartum 01/14/2017    Past Surgical History:  Procedure Laterality Date   BREAST ENHANCEMENT SURGERY Bilateral    BREAST SURGERY  2010   Augmentation   CESAREAN SECTION N/A 08/25/2017   Procedure:  CESAREAN SECTION;  Surgeon: Malachy Mood, MD;  Location: [facility];  Service: Obstetrics;  Laterality: N/A;  birth at 5 weight 9lb 0oz apgar 9/9   CESAREAN SECTION N/A 05/05/2019   Procedure: CESAREAN SECTION;  Surgeon: Malachy Mood, MD;  Location: [facility];  Service: Obstetrics;  Laterality: N/A;   COLPOSCOPY  01/01/2012   Benign   DILATION AND EVACUATION N/A 02/15/2016   Procedure: DILATATION AND EVACUATION;  Surgeon: Malachy Mood, MD;  Location: [facility];  Service: Gynecology;  Laterality: N/A;   DILATION AND EVACUATION N/A 06/10/2016   Procedure: DILATATION AND EVACUATION;  Surgeon: Malachy Mood, MD;  Location: [facility];  Service: Gynecology;  Laterality: N/A;   TONSILLECTOMY  age 66    Social History   Tobacco Use   Smoking status: Every Day    Packs/day: 0.50    Years: 15.00    Pack years: 7.50    Types: Cigarettes    Last attempt to quit: 12/30/2016    Years since quitting: 4.7   Smokeless tobacco: Former  Substance Use Topics   Alcohol use: No    Comment: rarely    Family History  Problem Relation Age of Onset   Diabetes Mother    Multiple sclerosis Mother    Hypertension Mother    Lung cancer Father        died at age 49   Hypertension Father    Diabetes Father    Cancer Maternal Grandmother    Lung cancer Maternal Grandmother 5     Current medication list and allergy/intolerance information reviewed:    Current Outpatient Medications  Medication Sig Dispense Refill   escitalopram (LEXAPRO) 10 MG tablet Take 28m (1/2 tablet) daily for 8 days  then increase to 36m (1 full tablet) daily. 90 tablet 1   hydrochlorothiazide (HYDRODIURIL) 25 MG tablet Take 1 tablet (25 mg total) by mouth daily. 90 tablet 1   No current facility-administered medications for this visit.    No Known Allergies    Review of Systems: Constitutional:  No  fever, no chills, No recent illness, No unintentional weight changes. No significant fatigue.  HEENT: No   headache, no vision change, no hearing change, No sore throat, No  sinus pressure Cardiac: No  chest pain, No  pressure, No palpitations, No  Orthopnea Respiratory:  No  shortness of breath. No  Cough Gastrointestinal: No  abdominal pain, No  nausea, No  vomiting,  No  blood in stool, No  diarrhea, No  constipation  Musculoskeletal: No new myalgia/arthralgia Skin: No  Rash, No other wounds/concerning lesions Genitourinary: No  incontinence, No  abnormal genital bleeding, No abnormal genital discharge Hem/Onc: No  easy bruising/bleeding, No  abnormal lymph node Endocrine: No cold intolerance,  No heat intolerance. No polyuria/polydipsia/polyphagia  Neurologic: No  weakness, No  dizziness, No  slurred speech/focal weakness/facial droop Psychiatric: No  concerns with depression, No  concerns with anxiety, No sleep problems, No mood problems  Exam:  BP (!) 151/96    Pulse 80    Resp 20    Ht '5\' 7"'  (1.702 m)    Wt 257 lb (116.6 kg)    SpO2 100%    BMI 40.25 kg/m  Constitutional: VS see above. General Appearance: alert, well-developed, well-nourished, NAD Eyes: Normal lids and conjunctive, non-icteric sclera Ears, Nose, Mouth, Throat: MMM, Normal external inspection ears/nares/mouth/lips/gums. TM normal bilaterally.   Neck: No masses, trachea midline. No thyroid enlargement. No tenderness/mass appreciated. No lymphadenopathy Respiratory: Normal respiratory effort. no wheeze, no rhonchi, no rales Cardiovascular: S1/S2 normal, no murmur, no rub/gallop auscultated. RRR. No lower extremity edema. Pedal pulse II/IV bilaterally PT. No carotid bruit or JVD. No abdominal aortic bruit. Gastrointestinal: Nontender, no masses. No hepatomegaly, no splenomegaly. No hernia appreciated. Bowel sounds normal. Rectal exam deferred.  Musculoskeletal: Gait normal. No clubbing/cyanosis of digits.  Neurological: Normal balance/coordination. No tremor. No cranial nerve deficit on limited exam. Motor and sensation intact  and symmetric. Cerebellar reflexes intact.  Skin: warm, dry, intact. No rash/ulcer. No concerning nevi or subq nodules on limited exam.   Psychiatric: Normal judgment/insight. Normal mood and affect. Oriented x3.    ASSESSMENT/PLAN:   1. Annual physical exam Checking labs as below.  Recommend following up with dental care and updating eye exam.  Wellness information provided with AVS. - Lipid panel - COMPLETE METABOLIC PANEL WITH GFR - CBC with Differential/Platelet  2. Encounter for screening mammogram for malignant neoplasm of breast Mammogram ordered today. - MM 3D SCREEN BREAST BILATERAL; Future  3. Need for hepatitis C screening test Discussed screening recommendations.  Patient is low risk for hepatitis C and would like to postpone testing for a later time.  4. Diabetes mellitus screening Checking hemoglobin A1c. - Hemoglobin A1c  5. Thyroid disorder screen Checking TSH. - TSH  6. Anxiety and depression Continue Lexapro 10 mg daily.  Advised that this medication will likely continue to improve and efficacy over the next couple of weeks.  7. Hypertension, unspecified type Blood pressure is above goal today.  Increasing hydrochlorothiazide to 25 mg daily.  Recommend checking blood pressure at home at least 3 times weekly and keeping a log.  Follow a low-sodium diet, continue regular exercise, and aim for weight loss.  Recommend smoking cessation.  Orders Placed This Encounter  Procedures   MM 3D SCREEN BREAST BILATERAL   TSH   Lipid panel   COMPLETE METABOLIC PANEL WITH GFR   CBC with Differential/Platelet   Hemoglobin A1c    Meds ordered this encounter  Medications   hydrochlorothiazide (HYDRODIURIL) 25 MG tablet    Sig: Take 1 tablet (25 mg total) by mouth daily.    Dispense:  90 tablet    Refill:  1    Order Specific Question:   Supervising Provider    Answer:   Luetta Nutting [[id number]]    Patient Instructions  Preventive Care 38-67 Years Old,  Female Preventive care refers to lifestyle choices and visits with your health care provider that can promote health and wellness. Preventive care visits are also called wellness exams. What can I expect for my preventive care visit? Counseling Your health care provider may ask you questions about your: Medical history, including: Past medical problems. Family medical history. Pregnancy history. Current health, including: Menstrual cycle. Method of birth control. Emotional well-being. Home life and relationship well-being. Sexual activity and sexual health. Lifestyle, including: Alcohol, nicotine or tobacco, and drug use. Access to firearms. Diet, exercise, and sleep habits. Work and work Statistician. Sunscreen use. Safety issues such as seatbelt and bike helmet use. Physical exam Your health care provider will check your: Height and weight. These may be used to calculate your BMI (body mass index). BMI is a measurement that tells if you are at a healthy weight. Waist circumference. This measures the distance around your waistline. This measurement also tells if you are at a healthy weight and may help predict your risk of certain diseases, such as type 2 diabetes and high blood pressure. Heart rate and blood pressure. Body temperature. Skin for abnormal spots. What immunizations do I need? Vaccines are usually given at various ages, according to a schedule. Your health care provider will recommend vaccines for you based on your age, medical history, and lifestyle or other factors, such as travel or where you work. What tests do I need? Screening Your health care provider may recommend screening tests for certain conditions. This may include: Lipid and cholesterol levels. Diabetes screening. This is done by checking your blood sugar (glucose) after you have not eaten for a while (fasting). Pelvic exam and Pap test. Hepatitis B test. Hepatitis C test. HIV (human immunodeficiency  virus) test. STI (sexually transmitted infection) testing, if you are at risk. Lung cancer screening. Colorectal cancer screening. Mammogram. Talk with your health care provider about when you should start having regular mammograms. This may depend on whether you have a family history of breast cancer. BRCA-related cancer screening. This may be done if you have a family history of breast, ovarian, tubal, or peritoneal cancers. Bone density scan. This is done to screen for osteoporosis. Talk with your health care provider about your test results, treatment options, and if necessary, the need for more tests. Follow these instructions at home: Eating and drinking  Eat a diet that includes fresh fruits and vegetables, whole grains, lean protein, and low-fat dairy products. Take vitamin and mineral supplements as recommended by your health care provider. Do not drink alcohol if: Your health care provider tells you not to drink. You are pregnant, may be pregnant, or are planning to become pregnant. If you drink alcohol: Limit how much you have to 0-1 drink a day. Know how much alcohol is in your drink. In the [place] one drink equals  one 12 oz bottle of beer (355 mL), one 5 oz glass of wine (148 mL), or one 1 oz glass of hard liquor (44 mL). Lifestyle Brush your teeth every morning and night with fluoride toothpaste. Floss one time each day. Exercise for at least 30 minutes 5 or more days each week. Do not use any products that contain nicotine or tobacco. These products include cigarettes, chewing tobacco, and vaping devices, such as e-cigarettes. If you need help quitting, ask your health care provider. Do not use drugs. If you are sexually active, practice safe sex. Use a condom or other form of protection to prevent STIs. If you do not wish to become pregnant, use a form of birth control. If you plan to become pregnant, see your health care provider for a prepregnancy visit. Take aspirin only  as told by your health care provider. Make sure that you understand how much to take and what form to take. Work with your health care provider to find out whether it is safe and beneficial for you to take aspirin daily. Find healthy ways to manage stress, such as: Meditation, yoga, or listening to music. Journaling. Talking to a trusted person. Spending time with friends and family. Minimize exposure to UV radiation to reduce your risk of skin cancer. Safety Always wear your seat belt while driving or riding in a vehicle. Do not drive: If you have been drinking alcohol. Do not ride with someone who has been drinking. When you are tired or distracted. While texting. If you have been using any mind-altering substances or drugs. Wear a helmet and other protective equipment during sports activities. If you have firearms in your house, make sure you follow all gun safety procedures. Seek help if you have been physically or sexually abused. What's next? Visit your health care provider once a year for an annual wellness visit. Ask your health care provider how often you should have your eyes and teeth checked. Stay up to date on all vaccines. This information is not intended to replace advice given to you by your health care provider. Make sure you discuss any questions you have with your health care provider. Document Revised: 02/20/2021 Document Reviewed: 02/20/2021 Elsevier Patient Education  [place].   Follow-up plan: Return in about 2 weeks (around 10/21/2021) for nurse visit for BP check.  Clearnce Sorrel, DNP, APRN, FNP-BC [facility]

## 2021-10-08 ENCOUNTER — Other Ambulatory Visit: Payer: Self-pay | Admitting: Medical-Surgical

## 2021-10-08 DIAGNOSIS — Z01419 Encounter for gynecological examination (general) (routine) without abnormal findings: Secondary | ICD-10-CM

## 2021-10-08 LAB — HEMOGLOBIN A1C
Hgb A1c MFr Bld: 5 % of total Hgb (ref <5.7)
Mean Plasma Glucose: 97 mg/dL
eAG (mmol/L): 5.4 mmol/L

## 2021-10-08 LAB — LIPID PANEL
Cholesterol: 153 mg/dL (ref <200)
HDL: 29 mg/dL — ABNORMAL LOW (ref >=50)
LDL Cholesterol (Calc): 105 mg/dL (calc) — ABNORMAL HIGH
Non-HDL Cholesterol (Calc): 124 mg/dL (calc) (ref <130)
Total CHOL/HDL Ratio: 5.3 (calc) — ABNORMAL HIGH (ref <5.0)
Triglycerides: 99 mg/dL (ref <150)

## 2021-10-08 LAB — COMPLETE METABOLIC PANEL WITH GFR
AG Ratio: 1.8 (calc) (ref 1.0–2.5)
ALT: 14 U/L (ref 6–29)
AST: 11 U/L (ref 10–30)
Albumin: 4.6 g/dL (ref 3.6–5.1)
Alkaline phosphatase (APISO): 58 U/L (ref 31–125)
BUN: 13 mg/dL (ref 7–25)
CO2: 29 mmol/L (ref 20–32)
Calcium: 9.2 mg/dL (ref 8.6–10.2)
Chloride: 99 mmol/L (ref 98–110)
Creat: 0.73 mg/dL (ref 0.50–0.99)
Globulin: 2.5 g/dL (calc) (ref 1.9–3.7)
Glucose, Bld: 93 mg/dL (ref 65–99)
Potassium: 4.5 mmol/L (ref 3.5–5.3)
Sodium: 134 mmol/L — ABNORMAL LOW (ref 135–146)
Total Bilirubin: 0.5 mg/dL (ref 0.2–1.2)
Total Protein: 7.1 g/dL (ref 6.1–8.1)
eGFR: 107 mL/min/{1.73_m2} (ref >=60)

## 2021-10-08 LAB — CBC WITH DIFFERENTIAL/PLATELET
Absolute Monocytes: 502 cells/uL (ref 200–950)
Basophils Absolute: 28 cells/uL (ref 0–200)
Basophils Relative: 0.3 %
Eosinophils Absolute: 214 cells/uL (ref 15–500)
Eosinophils Relative: 2.3 %
HCT: 43.4 % (ref 35.0–45.0)
Hemoglobin: 14.5 g/dL (ref 11.7–15.5)
Lymphs Abs: 2837 cells/uL (ref 850–3900)
MCH: 30.8 pg (ref 27.0–33.0)
MCHC: 33.4 g/dL (ref 32.0–36.0)
MCV: 92.1 fL (ref 80.0–100.0)
MPV: 10.5 fL (ref 7.5–12.5)
Monocytes Relative: 5.4 %
Neutro Abs: 5720 cells/uL (ref 1500–7800)
Neutrophils Relative %: 61.5 %
Platelets: 274 10*3/uL (ref 140–400)
RBC: 4.71 10*6/uL (ref 3.80–5.10)
RDW: 11.5 % (ref 11.0–15.0)
Total Lymphocyte: 30.5 %
WBC: 9.3 10*3/uL (ref 3.8–10.8)

## 2021-10-08 LAB — TSH: TSH: 1.83 mIU/L

## 2021-10-21 ENCOUNTER — Ambulatory Visit: Payer: 59

## 2021-12-03 LAB — FETAL NONSTRESS TEST

## 2022-03-06 ENCOUNTER — Encounter: Payer: Self-pay | Admitting: Medical-Surgical

## 2022-03-06 ENCOUNTER — Ambulatory Visit (INDEPENDENT_AMBULATORY_CARE_PROVIDER_SITE_OTHER): Payer: 59 | Admitting: Medical-Surgical

## 2022-03-06 VITALS — BP 126/85 | HR 82 | Resp 20 | Ht 67.0 in | Wt 261.4 lb

## 2022-03-06 DIAGNOSIS — I10 Essential (primary) hypertension: Secondary | ICD-10-CM | POA: Diagnosis not present

## 2022-03-06 DIAGNOSIS — Z716 Tobacco abuse counseling: Secondary | ICD-10-CM

## 2022-03-06 DIAGNOSIS — F32A Depression, unspecified: Secondary | ICD-10-CM

## 2022-03-06 DIAGNOSIS — Z7689 Persons encountering health services in other specified circumstances: Secondary | ICD-10-CM | POA: Diagnosis not present

## 2022-03-06 DIAGNOSIS — F419 Anxiety disorder, unspecified: Secondary | ICD-10-CM

## 2022-03-06 MED ORDER — HYDROCHLOROTHIAZIDE 25 MG PO TABS: 25.0000 mg | ORAL_TABLET | Freq: Every day | ORAL | 1 refills | Status: AC

## 2022-03-06 MED ORDER — PHENTERMINE HCL 15 MG PO CAPS: 15.0000 mg | ORAL_CAPSULE | ORAL | 0 refills | Status: AC

## 2022-03-06 MED ORDER — ESCITALOPRAM OXALATE 10 MG PO TABS: 10.0000 mg | ORAL_TABLET | Freq: Every day | ORAL | 1 refills | Status: AC

## 2022-03-06 NOTE — Patient Instructions (Signed)
Macro diet  Weight loss tips and tricks:  1.  Make sure to drink at least 64 ounces of water every day. 2.  Avoid alcohol. 3.  Avoid eating within 3 hours of going to bed. 4.  Cut out sugary drinks such as sweet tea, regular sodas, energy drinks, etc. 5.  Make sure you are getting enough sleep every night. 6.  Start making changes by cutting back portion sizes by 1/3. 7.  Keep a food diary to help identify areas for improvement and promote awareness of bad habits. 8.  Increase your activity.  Choose something you like to do that is fun for you so you are more likely to stick with it. 9.  Do not forget your protein! 10.  Measure your neck, upper arms, waist, hips, and thighs and write those measurements down somewhere before you start your weight loss journey.  Changes in your measurements will tell a far more accurate story than the number on a scale. 11.  As you go, pay attention to how your clothes fit! 12.  Weigh yourself as often or as little as you need to.  Some folks do better weighing every day while others do better with once a week. 13.  Do not get discouraged!  Weight loss efforts are meant to be lifestyle changes.  Once you stop a medication (if you are taking 1), your behaviors and habits will determine if you maintain your weight loss or not.  For those on medications:  1.  Take your medication as prescribed every day first thing in the morning. 2.  Common side effects include nausea and constipation.  To combat this, increased your daily water consumption.  Consider adding in a stool softener (available OTC) if needed.  Also increase fiber intake with vegetables and fruits. 3.  If you have any side effects or concerns while taking medication, please do not hesitate to reach out to us here at the office. 4.  While on weight loss medications, we do require you to follow-up every 4 weeks with an in office visit.  Refills will not be called in early on controlled substances.  Good  luck on your weight loss journey!  Have faith in your self and you will reach your goals!   

## 2022-03-06 NOTE — Progress Notes (Signed)
Established Patient Office Visit  Subjective   Patient ID: Tiffany Chapman, female   DOB: 20-Dec-1980 Age: 41 y.o. MRN: [id number]   Chief Complaint  Patient presents with   Weight Loss    HPI Pleasant 41 year old female presenting today for the following:  Weight concerns: Has always had struggles with her weight but is concerned since she has been fluctuating and gaining over the last year.  She is now doing an EM2 diet plan and exercising as noted below.  Is interested in possible medications to help with weight loss so that doing exercises is a little easier and more comfortable.  Has not taken weight loss medications before.  Mood: taking Lexapro 10mg  daily, tolerating well.   Tobacco abuse: smoking cigarettes 1/2 ppd. Cut back from 1 full ppd and is actively trying to quit. Considering trying nicotine replacements (lozenges, patches, etc). Has quit cold in the past two different times.   BP: not checking BP at home. Taking HTCZ 25mg  daily. Not adding salt but still doing some processed foods. Walking 3-4 days per week using YouTube videos. Denies CP, SOB, palpitations, lower extremity edema, dizziness, headaches, or vision changes.   Objective:    Vitals:   03/06/22 1349  BP: (!) 131/93  Pulse: 90  Resp: 20  Height: 5\' 7"  (1.702 m)  Weight: 261 lb 6.4 oz (118.6 kg)  SpO2: 99%  BMI (Calculated): 40.93    Physical Exam Vitals and nursing note reviewed.  Constitutional:      General: She is not in acute distress.    Appearance: Normal appearance. She is obese. She is not ill-appearing.  HENT:     Head: Normocephalic and atraumatic.  Cardiovascular:     Rate and Rhythm: Normal rate and regular rhythm.     Pulses: Normal pulses.     Heart sounds: Normal heart sounds.  Pulmonary:     Effort: Pulmonary effort is normal. No respiratory distress.     Breath sounds: Normal breath sounds. No wheezing, rhonchi or rales.  Skin:    General: Skin is warm and dry.   Neurological:     Mental Status: She is alert and oriented to person, place, and time.  Psychiatric:        Mood and Affect: Mood normal.        Behavior: Behavior normal.        Thought Content: Thought content normal.        Judgment: Judgment normal.   No results found for this or any previous visit (from the past 24 hour(s)).     The 10-year ASCVD risk score (Arnett DK, et al., 2019) is: 6.6%   Values used to calculate the score:     Age: 10 years     Sex: Female     Is Non-Hispanic African American: No     Diabetic: No     Tobacco smoker: Yes     Systolic Blood Pressure: 131 mmHg     Is BP treated: Yes     HDL Cholesterol: 29 mg/dL     Total Cholesterol: 153 mg/dL   Assessment & Plan:   1. Encounter for weight management Discussed various options for weight loss.  Unfortunately, her insurance plan does not cover any weight loss medications which limits our choices.  After review of the options, she would like to try phentermine.  Sending in phentermine 15 mg daily, starting with lower doses due to known blood pressure concerns.  Weight loss tips  and tricks provided with AVS.  Adjunct lifestyle and dietary modifications discussed in detail.  2. Anxiety and depression Continue Lexapro 10 mg daily.  Symptoms well controlled.  Refill sent.  3. Tobacco abuse counseling Discussed recommendations for tobacco cessation.  Patient is aware of risks of continuing to smoke and benefits of quitting.  She is actively working to cut back with a goal of full cessation.  She is considering adjunct nicotine replacement.  4. Hypertension, unspecified type Blood pressure elevated on arrival however recheck was 126/85.  Continue hydrochlorothiazide 25 mg daily.  Recommend starting to monitor blood pressure at home since there is cuff available.  Goal for blood pressure should be less than 130/80.  Recommend regular intentional exercise and low-sodium diet.  Discussed limiting processed foods as  these are often very high sodium.  Return in about 4 weeks (around 04/03/2022) for weight check.  ___________________________________________ Thayer Ohm, DNP, APRN, FNP-BC Primary Care and Sports Medicine [facility] [place]

## 2022-04-02 NOTE — Progress Notes (Unsigned)
   Established Patient Office Visit  Subjective   Patient ID: ALEXSIA DEFRANCESCO, female   DOB: Jun 26, 1981 Age: 41 y.o. MRN: [id number]   No chief complaint on file.   HPI Pleasant 41 year old female presenting today for weight check on phentermine.   ROS    Objective:    There were no vitals filed for this visit.  Physical Exam   No results found for this or any previous visit (from the past 24 hour(s)).   {Labs (Optional):23779}  The 10-year ASCVD risk score (Arnett DK, et al., 2019) is: 6.2%   Values used to calculate the score:     Age: 25 years     Sex: Female     Is Non-Hispanic African American: No     Diabetic: No     Tobacco smoker: Yes     Systolic Blood Pressure: 126 mmHg     Is BP treated: Yes     HDL Cholesterol: 29 mg/dL     Total Cholesterol: 153 mg/dL   Assessment & Plan:   No problem-specific Assessment & Plan notes found for this encounter.   No follow-ups on file.  ___________________________________________ Thayer Ohm, DNP, APRN, FNP-BC Primary Care and Sports Medicine [facility] [place]

## 2022-04-03 ENCOUNTER — Ambulatory Visit (INDEPENDENT_AMBULATORY_CARE_PROVIDER_SITE_OTHER): Payer: Self-pay | Admitting: Medical-Surgical

## 2022-04-03 DIAGNOSIS — Z91199 Patient's noncompliance with other medical treatment and regimen due to unspecified reason: Secondary | ICD-10-CM

## 2022-04-03 DIAGNOSIS — Z7689 Persons encountering health services in other specified circumstances: Secondary | ICD-10-CM

## 2023-09-03 DIAGNOSIS — J029 Acute pharyngitis, unspecified: Secondary | ICD-10-CM | POA: Diagnosis not present

## 2023-09-03 DIAGNOSIS — J019 Acute sinusitis, unspecified: Secondary | ICD-10-CM | POA: Diagnosis not present

## 2023-09-03 DIAGNOSIS — Z20822 Contact with and (suspected) exposure to covid-19: Secondary | ICD-10-CM | POA: Diagnosis not present

## 2023-10-28 DIAGNOSIS — Z6841 Body Mass Index (BMI) 40.0 and over, adult: Secondary | ICD-10-CM | POA: Diagnosis not present

## 2023-11-02 DIAGNOSIS — Z1331 Encounter for screening for depression: Secondary | ICD-10-CM | POA: Diagnosis not present

## 2023-11-02 DIAGNOSIS — E7800 Pure hypercholesterolemia, unspecified: Secondary | ICD-10-CM | POA: Diagnosis not present

## 2023-11-02 DIAGNOSIS — Z6841 Body Mass Index (BMI) 40.0 and over, adult: Secondary | ICD-10-CM | POA: Diagnosis not present

## 2023-11-02 DIAGNOSIS — E559 Vitamin D deficiency, unspecified: Secondary | ICD-10-CM | POA: Diagnosis not present

## 2023-11-09 DIAGNOSIS — Z6841 Body Mass Index (BMI) 40.0 and over, adult: Secondary | ICD-10-CM | POA: Diagnosis not present

## 2023-11-09 DIAGNOSIS — E7800 Pure hypercholesterolemia, unspecified: Secondary | ICD-10-CM | POA: Diagnosis not present

## 2023-11-16 DIAGNOSIS — E7800 Pure hypercholesterolemia, unspecified: Secondary | ICD-10-CM | POA: Diagnosis not present

## 2023-11-16 DIAGNOSIS — Z6841 Body Mass Index (BMI) 40.0 and over, adult: Secondary | ICD-10-CM | POA: Diagnosis not present

## 2023-12-21 DIAGNOSIS — Z13220 Encounter for screening for lipoid disorders: Secondary | ICD-10-CM | POA: Diagnosis not present

## 2023-12-21 DIAGNOSIS — Z7689 Persons encountering health services in other specified circumstances: Secondary | ICD-10-CM | POA: Diagnosis not present

## 2023-12-21 DIAGNOSIS — Z1231 Encounter for screening mammogram for malignant neoplasm of breast: Secondary | ICD-10-CM | POA: Diagnosis not present

## 2023-12-21 DIAGNOSIS — I10 Essential (primary) hypertension: Secondary | ICD-10-CM | POA: Diagnosis not present

## 2023-12-21 DIAGNOSIS — E559 Vitamin D deficiency, unspecified: Secondary | ICD-10-CM | POA: Diagnosis not present

## 2023-12-21 DIAGNOSIS — Z131 Encounter for screening for diabetes mellitus: Secondary | ICD-10-CM | POA: Diagnosis not present

## 2024-01-29 DIAGNOSIS — R928 Other abnormal and inconclusive findings on diagnostic imaging of breast: Secondary | ICD-10-CM | POA: Diagnosis not present
